# Patient Record
Sex: Male | Born: 1935 | Race: White | Hispanic: No | Marital: Married | Smoking: Never smoker
Health system: Southern US, Community
[De-identification: ages and names within clinical notes are randomized; demographics above are authoritative.]

## PROBLEM LIST (undated history)

## (undated) DIAGNOSIS — M1990 Unspecified osteoarthritis, unspecified site: Secondary | ICD-10-CM

## (undated) DIAGNOSIS — H3530 Unspecified macular degeneration: Secondary | ICD-10-CM

## (undated) DIAGNOSIS — K589 Irritable bowel syndrome without diarrhea: Secondary | ICD-10-CM

## (undated) DIAGNOSIS — I10 Essential (primary) hypertension: Secondary | ICD-10-CM

## (undated) DIAGNOSIS — E785 Hyperlipidemia, unspecified: Secondary | ICD-10-CM

## (undated) HISTORY — DX: Hyperlipidemia, unspecified: E78.5

## (undated) HISTORY — DX: Essential (primary) hypertension: I10

## (undated) HISTORY — PX: TOE SURGERY: SHX1073

## (undated) HISTORY — PX: TONSILLECTOMY: SUR1361

## (undated) HISTORY — PX: KNEE CARTILAGE SURGERY: SHX688

## (undated) HISTORY — PX: OTHER SURGICAL HISTORY: SHX169

## (undated) HISTORY — DX: Irritable bowel syndrome without diarrhea: K58.9

## (undated) HISTORY — PX: ELBOW SURGERY: SHX618

## (undated) HISTORY — PX: SHOULDER SURGERY: SHX246

---

## 2001-06-29 ENCOUNTER — Ambulatory Visit (HOSPITAL_COMMUNITY): Admission: RE | Admit: 2001-06-29 | Discharge: 2001-06-29 | Payer: Self-pay | Admitting: Internal Medicine

## 2003-01-30 ENCOUNTER — Ambulatory Visit (HOSPITAL_COMMUNITY): Admission: RE | Admit: 2003-01-30 | Discharge: 2003-01-30 | Payer: Self-pay | Admitting: Pediatrics

## 2004-02-03 ENCOUNTER — Ambulatory Visit: Payer: Self-pay | Admitting: Internal Medicine

## 2004-02-05 ENCOUNTER — Ambulatory Visit (HOSPITAL_COMMUNITY): Admission: RE | Admit: 2004-02-05 | Discharge: 2004-02-05 | Payer: Self-pay | Admitting: Internal Medicine

## 2004-02-19 ENCOUNTER — Ambulatory Visit (HOSPITAL_COMMUNITY): Admission: RE | Admit: 2004-02-19 | Discharge: 2004-02-19 | Payer: Self-pay | Admitting: Orthopaedic Surgery

## 2004-03-10 ENCOUNTER — Ambulatory Visit: Payer: Self-pay | Admitting: Internal Medicine

## 2004-06-12 ENCOUNTER — Ambulatory Visit (HOSPITAL_COMMUNITY): Admission: RE | Admit: 2004-06-12 | Discharge: 2004-06-12 | Payer: Self-pay | Admitting: Family Medicine

## 2005-01-27 ENCOUNTER — Ambulatory Visit (HOSPITAL_COMMUNITY): Admission: RE | Admit: 2005-01-27 | Discharge: 2005-01-27 | Payer: Self-pay | Admitting: Family Medicine

## 2005-01-27 ENCOUNTER — Encounter: Payer: Self-pay | Admitting: Orthopedic Surgery

## 2006-05-11 ENCOUNTER — Ambulatory Visit (HOSPITAL_COMMUNITY): Admission: RE | Admit: 2006-05-11 | Discharge: 2006-05-11 | Payer: Self-pay | Admitting: Orthopaedic Surgery

## 2006-05-19 ENCOUNTER — Ambulatory Visit (HOSPITAL_COMMUNITY): Admission: RE | Admit: 2006-05-19 | Discharge: 2006-05-19 | Payer: Self-pay | Admitting: Family Medicine

## 2006-06-13 ENCOUNTER — Encounter: Admission: RE | Admit: 2006-06-13 | Discharge: 2006-06-13 | Payer: Self-pay | Admitting: Orthopedic Surgery

## 2006-06-16 ENCOUNTER — Encounter: Admission: RE | Admit: 2006-06-16 | Discharge: 2006-06-16 | Payer: Self-pay | Admitting: Orthopedic Surgery

## 2006-06-28 ENCOUNTER — Encounter: Admission: RE | Admit: 2006-06-28 | Discharge: 2006-06-28 | Payer: Self-pay | Admitting: Orthopedic Surgery

## 2006-08-29 ENCOUNTER — Ambulatory Visit (HOSPITAL_COMMUNITY): Admission: RE | Admit: 2006-08-29 | Discharge: 2006-08-29 | Payer: Self-pay | Admitting: Orthopaedic Surgery

## 2007-01-26 ENCOUNTER — Ambulatory Visit (HOSPITAL_COMMUNITY): Admission: RE | Admit: 2007-01-26 | Discharge: 2007-01-26 | Payer: Self-pay | Admitting: Orthopaedic Surgery

## 2007-03-06 ENCOUNTER — Encounter: Admission: RE | Admit: 2007-03-06 | Discharge: 2007-03-06 | Payer: Self-pay | Admitting: Orthopaedic Surgery

## 2007-03-20 ENCOUNTER — Encounter: Admission: RE | Admit: 2007-03-20 | Discharge: 2007-03-20 | Payer: Self-pay | Admitting: Orthopaedic Surgery

## 2007-07-03 ENCOUNTER — Ambulatory Visit (HOSPITAL_COMMUNITY): Admission: RE | Admit: 2007-07-03 | Discharge: 2007-07-03 | Payer: Self-pay | Admitting: Ophthalmology

## 2009-02-14 ENCOUNTER — Ambulatory Visit (HOSPITAL_COMMUNITY): Admission: RE | Admit: 2009-02-14 | Discharge: 2009-02-14 | Payer: Self-pay | Admitting: Internal Medicine

## 2009-02-14 ENCOUNTER — Encounter: Payer: Self-pay | Admitting: Orthopedic Surgery

## 2009-03-05 ENCOUNTER — Encounter: Payer: Self-pay | Admitting: Orthopedic Surgery

## 2009-03-06 ENCOUNTER — Ambulatory Visit: Payer: Self-pay | Admitting: Orthopedic Surgery

## 2009-03-06 DIAGNOSIS — M25819 Other specified joint disorders, unspecified shoulder: Secondary | ICD-10-CM | POA: Insufficient documentation

## 2009-03-06 DIAGNOSIS — M7580 Other shoulder lesions, unspecified shoulder: Secondary | ICD-10-CM

## 2009-03-06 DIAGNOSIS — M25519 Pain in unspecified shoulder: Secondary | ICD-10-CM

## 2009-03-07 ENCOUNTER — Telehealth (INDEPENDENT_AMBULATORY_CARE_PROVIDER_SITE_OTHER): Payer: Self-pay | Admitting: *Deleted

## 2009-03-10 ENCOUNTER — Ambulatory Visit (HOSPITAL_COMMUNITY): Admission: RE | Admit: 2009-03-10 | Discharge: 2009-03-10 | Payer: Self-pay | Admitting: Orthopedic Surgery

## 2009-03-11 ENCOUNTER — Encounter: Payer: Self-pay | Admitting: Orthopedic Surgery

## 2009-03-25 ENCOUNTER — Ambulatory Visit: Payer: Self-pay | Admitting: Orthopedic Surgery

## 2009-03-25 DIAGNOSIS — M75120 Complete rotator cuff tear or rupture of unspecified shoulder, not specified as traumatic: Secondary | ICD-10-CM

## 2009-03-31 ENCOUNTER — Encounter (INDEPENDENT_AMBULATORY_CARE_PROVIDER_SITE_OTHER): Payer: Self-pay | Admitting: *Deleted

## 2009-04-04 ENCOUNTER — Ambulatory Visit (HOSPITAL_COMMUNITY): Admission: RE | Admit: 2009-04-04 | Discharge: 2009-04-04 | Payer: Self-pay | Admitting: Orthopedic Surgery

## 2009-04-04 ENCOUNTER — Ambulatory Visit: Payer: Self-pay | Admitting: Orthopedic Surgery

## 2009-04-08 ENCOUNTER — Telehealth: Payer: Self-pay | Admitting: Orthopedic Surgery

## 2009-04-09 ENCOUNTER — Ambulatory Visit: Payer: Self-pay | Admitting: Orthopedic Surgery

## 2009-04-10 ENCOUNTER — Encounter (HOSPITAL_COMMUNITY): Admission: RE | Admit: 2009-04-10 | Discharge: 2009-05-10 | Payer: Self-pay | Admitting: Orthopedic Surgery

## 2009-04-10 ENCOUNTER — Encounter: Payer: Self-pay | Admitting: Orthopedic Surgery

## 2009-04-15 ENCOUNTER — Ambulatory Visit: Payer: Self-pay | Admitting: Orthopedic Surgery

## 2009-05-08 ENCOUNTER — Encounter: Payer: Self-pay | Admitting: Orthopedic Surgery

## 2009-05-13 ENCOUNTER — Encounter (HOSPITAL_COMMUNITY): Admission: RE | Admit: 2009-05-13 | Discharge: 2009-06-12 | Payer: Self-pay | Admitting: Orthopedic Surgery

## 2009-05-19 ENCOUNTER — Ambulatory Visit: Payer: Self-pay | Admitting: Orthopedic Surgery

## 2009-06-06 ENCOUNTER — Encounter: Payer: Self-pay | Admitting: Orthopedic Surgery

## 2009-06-30 ENCOUNTER — Ambulatory Visit: Payer: Self-pay | Admitting: Orthopedic Surgery

## 2009-10-02 ENCOUNTER — Ambulatory Visit: Payer: Self-pay | Admitting: Orthopedic Surgery

## 2009-10-02 DIAGNOSIS — M7660 Achilles tendinitis, unspecified leg: Secondary | ICD-10-CM

## 2009-10-27 ENCOUNTER — Encounter (HOSPITAL_COMMUNITY): Admission: RE | Admit: 2009-10-27 | Discharge: 2009-11-26 | Payer: Self-pay | Admitting: Orthopedic Surgery

## 2009-11-13 ENCOUNTER — Encounter: Payer: Self-pay | Admitting: Orthopedic Surgery

## 2009-11-17 ENCOUNTER — Encounter: Payer: Self-pay | Admitting: Orthopedic Surgery

## 2009-11-21 ENCOUNTER — Encounter: Payer: Self-pay | Admitting: Orthopedic Surgery

## 2009-11-28 ENCOUNTER — Encounter (HOSPITAL_COMMUNITY)
Admission: RE | Admit: 2009-11-28 | Discharge: 2009-12-28 | Payer: Self-pay | Source: Home / Self Care | Admitting: Orthopedic Surgery

## 2009-12-02 ENCOUNTER — Encounter: Payer: Self-pay | Admitting: Orthopedic Surgery

## 2010-02-03 ENCOUNTER — Ambulatory Visit: Payer: Self-pay | Admitting: Orthopedic Surgery

## 2010-02-03 DIAGNOSIS — R609 Edema, unspecified: Secondary | ICD-10-CM

## 2010-04-19 ENCOUNTER — Encounter: Payer: Self-pay | Admitting: Family Medicine

## 2010-04-19 ENCOUNTER — Encounter: Payer: Self-pay | Admitting: Orthopedic Surgery

## 2010-04-28 NOTE — Miscellaneous (Signed)
Summary: PT order  PT order   Imported By: Cammie Sickle 05/07/2009 20:03:30  _____________________________________________________________________  External Attachment:    Type:   Image     Comment:   External Document

## 2010-04-28 NOTE — Miscellaneous (Signed)
Summary: Pre-auth notification for out-patient procedure  Clinical Lists Changes   RE: Out-patient surgery scheduled 04/04/09 at [facility] / CPT [phone], per [employer], notification # [id number], per online pre-authorization

## 2010-04-28 NOTE — Assessment & Plan Note (Signed)
Summary: 6 WK RE-CK/POST OP RT SHOULDER SURG 04/04/09/SEC HORIZ/CAF   Visit Type:  post op check Referring Provider:  Dr. Dwana Melena  Primary Provider:  Dr. Dwana Melena   CC:  right shoulder .  History of Present Illness: I saw Christopher Warner in the office today for a 6 week  followup visit.  He is a 77 years old man with the complaint of:  right shoulder.  Procedure 04/04/09 open rotator cuff repair, right shoulder, with distal clavicle excision  Medication None  examination reveals a full range of motion the RIGHT shoulder with excellent strength.  He has 180 of flexion.  Doing very well  Therapy can stop  Soak it from the lift light weights and do light activities we'll see him in 3 months    Allergies: No Known Drug Allergies   Other Orders: Post-Op Check (16606)  Patient Instructions: 1)  3 months f/u  2)  light activities are ok  3)  light weights are ok

## 2010-04-28 NOTE — Miscellaneous (Signed)
Summary: PT order/referral form  PT order/referral form   Imported By: Jacklynn Ganong 11/18/2009 12:21:35  _____________________________________________________________________  External Attachment:    Type:   Image     Comment:   External Document

## 2010-04-28 NOTE — Progress Notes (Signed)
Summary: call about post op appointment and PT  Phone Note Call from Patient   Caller: Patient Summary of Call: Patient called back, fol'g post op appointment change by our office d/t weather from Tues to Wed 04/09/09 -- asked if he should keep the 8am appt Wed 1/12, for physical therapy.  I advised patient to wait to see Dr Romeo Apple as scheduled before starting PT.  Please advise if patient should proceed with therapy prior to appt in office. Initial call taken by: Cammie Sickle,  April 08, 2009 2:20 PM  Follow-up for Phone Call        agree Follow-up by: Fuller Canada MD,  April 09, 2009 9:47 AM

## 2010-04-28 NOTE — Assessment & Plan Note (Signed)
Summary: 1 WK RE-CK/POST OP 2/RT SHOULDER/SEC HORIZ/CAF   Referring Provider:  Dr. Dwana Melena  Primary Provider:  Dr. Dwana Melena    History of Present Illness: I saw Christopher Warner in the office today for a followup visit.  He is a 77 years old man with the complaint of: postop visit #2, postop day #12  Procedure 04/04/09 opened rotator cuff repair, right shoulder, with distal clavicle excision  Medication None  Subjectives post op 2 remove staples.  wants to know if he can leave sling off some.  Has not driven yet.  therapy is going well  His arm looks great his wound looks good his elbow wrist and hand motion is normal.  His shoulder motion matches his physical therapy notes see physical therapy notes  Doing well followup in 6 weeks    Allergies: No Known Drug Allergies   Impression & Recommendations:  Problem # 1:  AFTERCARE FOLLOW SURGERY MUSCULOSKEL SYSTEM NEC (ICD-V58.78) Assessment Improved  Orders: Post-Op Check (40102)  Problem # 2:  RUPTURE ROTATOR CUFF (ICD-727.61) Assessment: Improved  Orders: Post-Op Check (72536)  Patient Instructions: 1)  continure therapy  2)   come back in 6 weeks 3)  take med as needed

## 2010-04-28 NOTE — Miscellaneous (Signed)
Summary: PT progress note  PT progress note   Imported By: Jacklynn Ganong 12/03/2009 09:24:24  _____________________________________________________________________  External Attachment:    Type:   Image     Comment:   External Document

## 2010-04-28 NOTE — Assessment & Plan Note (Signed)
Summary: 1 M RE-CK/POST OP/RT SHOULDER SURG 04/04/09/SEC HORIZ/CAF   Visit Type:  Follow-up Referring Provider:  Dr. Dwana Melena  Primary Provider:  Dr. Dwana Melena   CC:  post op shoulder.  History of Present Illness: I saw Christopher Warner in the office today for a followup visit.  He is a 77 years old man with the complaint of: postop visit #3, 6 weeks post op   Procedure 04/04/09 opened rotator cuff repair, right shoulder, with distal clavicle excision  Medication None  Subjectives to recheck the shoulder after doing therapy.  Doing well, no pain.     Allergies: No Known Drug Allergies  Physical Exam  Additional Exam:  PROM : 150   AROM 130   incision looks great    Other Orders: Post-Op Check (16109)  Patient Instructions: 1)  Return in 6 weeks  2)  continue the therapy

## 2010-04-28 NOTE — Assessment & Plan Note (Signed)
Summary: POST OP 1/RT SHOULDER/SEC HORIZ,EFF JAN2011/CAF   Referring Provider:  Dr. Dwana Melena  Primary Provider:  Dr. Dwana Melena    History of Present Illness: DOS Procedure Medication Subjectives Date of surgery was January 7  Procedure was opened rotator cuff repair, LEFT shoulder, with distal clavicle excision  Medications none  Subjective no pain  Incision no swelling, drainage or tenderness. No swelling in the shoulder. Neurovascular exam in the hand is normal.  Doing well  Allergies: No Known Drug Allergies   Impression & Recommendations:  Problem # 1:  AFTERCARE FOLLOW SURGERY MUSCULOSKEL SYSTEM NEC (ICD-V58.78)  Problem # 2:  RUPTURE ROTATOR CUFF (ICD-727.61)  Orders: Physical Therapy Referral (PT) Post-Op Check (76283)  Patient Instructions: 1)  OK TO START THERAPY NOW  2)  OK TO SLEEP IN YOUR BED  3)  WEAR SLING X 6 WEEKS  4)  USE THE ICE PAD AS NEEDED  5)  RETURN IN A W EEK FOR STAPLES TO COME OUT

## 2010-04-28 NOTE — Miscellaneous (Signed)
Summary: PT clinical evaluation  PT clinical evaluation   Imported By: Jacklynn Ganong 11/18/2009 12:22:14  _____________________________________________________________________  External Attachment:    Type:   Image     Comment:   External Document

## 2010-04-28 NOTE — Miscellaneous (Signed)
Summary: OT clinical evaluation  OT clinical evaluation   Imported By: Jacklynn Ganong 04/22/2009 11:22:46  _____________________________________________________________________  External Attachment:    Type:   Image     Comment:   External Document

## 2010-04-28 NOTE — Assessment & Plan Note (Signed)
Summary: LT HEEL/ACHILLES PAIN RECURRING/?XRAY/[employer] MEDICARE COMPL/CAF   Visit Type:  Follow-up Referring Provider:  Dr. Dwana Melena  Primary Provider:  Dr. Dwana Melena   CC:  left heel pain.  History of Present Illness: DX: Achilles tendonitis  Treatment: therapy  MEDS: none  Complaints: He says that he has swelling in his ankle and leg, no pain. He says that it feels funny when he moves it sometimes.  Today, scheduled for: recheck  The ankle/achilles tendon is assymptomatic   He does have edema in the left leg no tenderness   N ROM in the ankle      Allergies: No Known Drug Allergies  Past History:  Past Medical History: Last updated: 03/25/2009   cholesterol htn  Past Surgical History: Last updated: 03/06/2009 tonsils back x 3 rt knee 2007  Family history reviewed for relevance to current acute and chronic problems. Social history (including risk factors) reviewed for relevance to current acute and chronic problems.  Family History: Reviewed history from 03/06/2009 and no changes required. na  Social History: Reviewed history from 03/06/2009 and no changes required. Patient is married.  retired no smoking no alcohol no caffeine  Review of Systems      See HPI   Impression & Recommendations:  Problem # 1:  ACHILLES TENDINITIS (ICD-726.71) Assessment Improved  Orders: Est. Patient Level III (04540)  Problem # 2:  EDEMA (ICD-782.3) Assessment: New  compression hose as needed   Orders: Est. Patient Level III (98119)  Patient Instructions: 1)  Give it a few more weeks for the swelling to go down.  2)  Get a compression hose to wear  3)  Please schedule a follow-up appointment as needed.   Orders Added: 1)  Est. Patient Level III [14782]

## 2010-04-28 NOTE — Miscellaneous (Signed)
Summary: PT progress note  PT progress note   Imported By: Jacklynn Ganong 12/09/2009 13:23:27  _____________________________________________________________________  External Attachment:    Type:   Image     Comment:   External Document

## 2010-04-28 NOTE — Assessment & Plan Note (Signed)
Summary: 3 M RE-CK RT SHOULDER+CK LT HEEL/SHOULD SUR 04/04/09/SEC HORIZ/CAF   Visit Type:  Follow-up Referring Provider:  Dr. Dwana Melena  Primary Provider:  Dr. Dwana Melena   CC:  right shoulder.  History of Present Illness: I saw Christopher Warner in the office today for a followup visit.  He is a 77 years old man s/p   Procedure 04/04/09 open rotator cuff repair, right shoulder, with distal clavicle excision  Medication None  no problems with her shoulder at this time does complain of LEFT Achilles tendon pain  Pain for about a month or so no trauma no previous injuries or problems with his Achilles tendon.  Exam shows that he has tenderness and swelling in nodularity in the Achilles tendon with normal passive range of motion normal strength ankle is stable his gait seems to be normal skin is intact.  Impression Achilles tendinitis recommend physical therapy  RIGHT shoulder doing well discharge in terms of RIGHT shoulder  Follow up as needed  Allergies: No Known Drug Allergies   Impression & Recommendations:  Problem # 1:  ACHILLES TENDINITIS (ICD-726.71) Assessment New  Orders: Physical Therapy Referral (PT) Est. Patient Level II (78295)  Problem # 2:  AFTERCARE FOLLOW SURGERY MUSCULOSKEL SYSTEM NEC (ICD-V58.78) Assessment: Improved  Orders: Post-Op Check (62130)  Patient Instructions: 1)  Go for therapy for left achilles tendinitis for 4 weeks 2)  come back as needed for shoulder

## 2010-04-28 NOTE — Miscellaneous (Signed)
Summary: OT progress note  OT progress note   Imported By: Jacklynn Ganong 06/17/2009 11:42:45  _____________________________________________________________________  External Attachment:    Type:   Image     Comment:   External Document

## 2010-04-28 NOTE — Miscellaneous (Signed)
Summary: Rehab Report  Rehab Report   Imported By: Elvera Maria 05/20/2009 11:23:37  _____________________________________________________________________  External Attachment:    Type:   Image     Comment:   progress note pt

## 2010-04-28 NOTE — Letter (Signed)
Summary: Surgery order RT shoulder  Surgery order RT shoulder   Imported By: Cammie Sickle 04/09/2009 16:45:49  _____________________________________________________________________  External Attachment:    Type:   Image     Comment:   External Document

## 2010-06-14 LAB — DIFFERENTIAL
Basophils Absolute: 0 10*3/uL (ref 0.0–0.1)
Basophils Relative: 0 % (ref 0–1)
Eosinophils Absolute: 0.2 10*3/uL (ref 0.0–0.7)
Eosinophils Relative: 3 % (ref 0–5)
Lymphocytes Relative: 29 % (ref 12–46)
Lymphs Abs: 1.9 10*3/uL (ref 0.7–4.0)
Monocytes Absolute: 0.6 10*3/uL (ref 0.1–1.0)
Monocytes Relative: 10 % (ref 3–12)
Neutro Abs: 3.8 10*3/uL (ref 1.7–7.7)
Neutrophils Relative %: 58 % (ref 43–77)

## 2010-06-14 LAB — CBC
HCT: 45.2 % (ref 39.0–52.0)
Hemoglobin: 15.2 g/dL (ref 13.0–17.0)
MCHC: 33.6 g/dL (ref 30.0–36.0)
MCV: 95.3 fL (ref 78.0–100.0)
Platelets: 209 10*3/uL (ref 150–400)
RBC: 4.74 MIL/uL (ref 4.22–5.81)
RDW: 13.3 % (ref 11.5–15.5)
WBC: 6.6 10*3/uL (ref 4.0–10.5)

## 2010-06-14 LAB — BASIC METABOLIC PANEL
BUN: 16 mg/dL (ref 6–23)
CO2: 32 mEq/L (ref 19–32)
Calcium: 9.1 mg/dL (ref 8.4–10.5)
Chloride: 101 mEq/L (ref 96–112)
Creatinine, Ser: 1.13 mg/dL (ref 0.4–1.5)
GFR calc Af Amer: >60 mL/min (ref >60)
GFR calc non Af Amer: >60 mL/min (ref >60)
Glucose, Bld: 99 mg/dL (ref 70–99)
Potassium: 3.9 mEq/L (ref 3.5–5.1)
Sodium: 139 mEq/L (ref 135–145)

## 2010-06-14 LAB — PROTIME-INR
INR: 1.08 (ref 0.00–1.49)
Prothrombin Time: 13.9 seconds (ref 11.6–15.2)

## 2010-06-14 LAB — APTT: aPTT: 23 seconds — ABNORMAL LOW (ref 24–37)

## 2010-08-11 NOTE — Op Note (Signed)
Christopher Warner, Christopher Warner               ACCOUNT NO.:  [id number]   MEDICAL RECORD NO.:  [id number]          PATIENT TYPE:  AMB   LOCATION:  DAY                           FACILITY:  [facility]   PHYSICIAN:  J. Darreld Mclean, M.D. DATE OF BIRTH:  07-Dec-1927   DATE OF PROCEDURE:  01/26/2007  DATE OF DISCHARGE:                               OPERATIVE REPORT   PREOPERATIVE DIAGNOSES:  1. Tear of medial meniscus of the right knee.  2. Degenerative joint disease.   POSTOPERATIVE DIAGNOSES:  1. Tear of medial meniscus of the right knee.  2. Degenerative joint disease.  3. Loose osteochondral fragments medially and laterally.   PROCEDURE:  Arthroscopy, __________  knee.  Partial medial meniscectomy  and debridement osteochondral fragments medial and lateral of the distal  femur.   ANESTHESIA:  Was spinal.   TOURNIQUET TIME:  Was 28 minutes.   SURGEON:  J. Darreld Mclean, M.D.   Holmium laser was used during the procedure.  No drains.   INDICATIONS:  The patient has had pain and tenderness in his right knee  for several months.  MRI done in June showed a tear of medial meniscus.  He initially improved with conservative treatment, but it has gotten  worse with more pain, giving way and swelling.  Surgery is recommended.  Risks and imponderables of the procedure discussed preoperatively.  The  patient appeared to understand and agreed to the procedure.   DESCRIPTION OF THE PROCEDURE:  The patient was seen in the holding area.  The right knee was identified as the correct surgical site.  He placed a  mark on the right knee.  I placed a mark on the right knee.  I talked to  his wife preoperatively.  The patient was brought to the operating room  and given spinal anesthesia and then placed supine on the operating room  table.  Tourniquet and leg holder were placed deflated right upper  thigh.  He was prepped and draped in the usual manner.  We had a time-  out identifying Christopher Warner as the patient  and the right knee as the  correct surgical site.  His leg was elevated, wrapped circumferentially  with an Esmarch bandage.  Tourniquet inflated to 300 mmHg.  Esmarch  bandage removed.  Inflow cannula inserted medially, and lactated  Ringer's was instilled in the knee by an infusion pump.  Laterally, the  arthroscope was inserted.  Knee was systematically examined.   Findings included some synovitis in the patella pouch area with  degenerative joint disease of the patella, grade 2.  Medially, he had  loose particles of meniscus and articular portions floating around  medially.  There was a tear in the posterior horn of the medial meniscus  and also anteriorly.  He had grade 3 changes of the distal femoral  condyle with some loose osteoarticular chondral surfaces.  The anterior  cruciate was partially torn within the substance but appears to be  competent laterally.  He had a loose osteochondral articular surface  fragments hanging down, but the meniscus appeared to be slightly frayed  but  intact.  Gutters had some small meniscal fragments.   Attention was directed to the medial side, and using a meniscal shaver  and a punch, the area was debrided of loose fragments, and then the  medial meniscectomy was carried out.  A good, smooth contour was  obtained.  The osteochondral fragments debrided.  Laterally, he had a  large osteochondral fragment that was loose, and this was debrided and  smoothed out.  This was on the distal femur.  Again, the meniscus,  although had fraying, was not torn.  I used a holmium laser to complete  the meniscectomy medially, smooth out the area and then also around the  area of the defect on the distal femur medially.  Permanent pictures  were taken throughout the procedure.  Knee was systematically examined  and no new pathology found.  The wound was reapproximated using 3-0  nylon interrupted vertical mattress manner.  Marcaine 0.25% was  instilled in the  knee around the anterior puncture wound.  Tourniquet  deflated after 28 minutes.  Sterile dressing applied.  Bulky dressing  applied.  Knee immobilizer applied.  The patient was given a  prescription for Darvocet-N 100.  He went to recovery in good position.  I will see him in the office in approximately 10 days to 2 weeks.  Physical therapy have been arranged.  If he has any difficulty, contact  me through the office hospital beeper system.           ______________________________  Shela Commons. Darreld Mclean, M.D.     JWK/MEDQ  D:  01/26/2007  T:  01/26/2007  Job:  [id number]

## 2010-08-11 NOTE — H&P (Signed)
NAMEMOHAMMAD, GRANADE               ACCOUNT NO.:  [id number]   MEDICAL RECORD NO.:  [id number]          PATIENT TYPE:  AMB   LOCATION:  DAY                           FACILITY:  [facility]   PHYSICIAN:  J. Darreld Mclean, M.D. DATE OF BIRTH:  [birth year]   DATE OF ADMISSION:  01/26/2007  DATE OF DISCHARGE:  LH                              HISTORY & PHYSICAL   CHIEF COMPLAINT:  My knee hurts.   The patient is a 77 year old male with pain and tenderness in his right  knee.  He has had giving way and pain in the knee for some time.  I saw  him in May of this year for complaints of significant pain and giving  way of the knee.  He obtained an MRI on June 2 of this year showing a  complex tear of the anterior horn medial meniscus with a perimeniscal  cyst.  He had a longitudinal tear in the PCL and he had a tear in the  medial portion of the meniscus.  He got better after I gave him an  treatment injection.  He wanted to hold off any surgery.  Pain has  reappeared and he is having more pain, more swelling, popping.  I have  recommended an arthroscopy of the knee.  He has elected at this point in  time to have the surgery.  The risks and imponderables have been  discussed with him.   CURRENT MEDICATIONS:  1. Metoprolol 50 mg daily.  2. Hydrochlorothiazide 25 mg daily.  3. Naprosyn 500 mg b.i.d.  4. Allopurinol 300 mg daily.  5. __________ 20 mg daily.  6. Lutein 6 mg daily.  7. Aspirin 81 mg daily.   He denies any allergies.   He is post back surgery x3 in 1998, 1991 and 1994.   He does not smoke or drink.   FAMILY HISTORY:  Unremarkable.   He does have hypertension, high blood pressure, back pain, leg pain,  cataracts.  He has been seen previously by [facility]'s Dr.  Jeral Fruit and also by his family doctor, Dr. Milinda Cave.   The patient is married, lives here in [place].   He is alert, cooperative, oriented.  HEENT: Negative.  NECK:  Supple.  LUNGS:  Clear to P&A.  HEART:  Regular without murmur heard.  ABDOMEN:  Soft and benign without masses.  Right knee has an effusion, pain and tenderness in the medial joint  line, positive McMurray medially.  He has crepitus of his knee on the  right.  Gait is good with a very just slight limp to the right.  Other  extremities negative.  The prior back surgery scars.  Range of motion of his back is good.  NEUROLOGIC:  Intact.  SKIN:  Intact.   IMPRESSION:  1. Tear, medial meniscus, right knee.  2. History of low back pain with chronic pain.  3. Hypertension.  4. Hypercholesterolemia.   PLAN:  Medial meniscectomy via arthroscopy of the knee on the right.  Labs are pending.  ______________________________  Shela Commons. Darreld Mclean, M.D.     JWK/MEDQ  D:  01/25/2007  T:  01/26/2007  Job:  [id number]

## 2010-08-14 NOTE — Procedures (Signed)
   [name], Christopher Warner                         ACCOUNT NO.:  [id number]   MEDICAL RECORD NO.:  [id number]                   PATIENT TYPE:  OUT   LOCATION:  DFTL                                 FACILITY:  [facility]   PHYSICIAN:  Francoise Schaumann. Halm, D.O.                DATE OF BIRTH:  01/27/1928   DATE OF PROCEDURE:  01/30/2003  DATE OF DISCHARGE:                                    STRESS TEST   REASON FOR STUDY:  Chest pain.   INDICATIONS FOR STUDY:  The patient is a 77 year old gentleman with recent  history of atypical chest pain.  The study is done to rule out coronary  artery disease.  He normally exercises regularly and has had no exertional  chest pain.   The patient underwent Bruce protocol exercise treadmill testing.  His  initial blood pressure, heart rate and evaluation were unremarkable.  His  resting ECG showed no LVH, dysrhythmia or ST segment abnormality.  The  patient underwent stress testing without complications.  The study was  excellent study with good tolerance to exercise.  He had a normal  hemodynamic response to exercise.  There were no dysrhythmias.  He easily  reached and exceeded his target heart rate and exercised approximately eight  minutes.  His ECG showed minimal 1 mm ST segment depression at maximum  exercise which resolved quickly into recovery.  He had no associated  symptoms and the study was stopped due to him reaching is target heart rate.   CONCLUSION:  Negative stress test for coronary artery disease.      ___________________________________________                                            Francoise Schaumann. Milford Cage, D.O.   SJH/MEDQ  D:  01/30/2003  T:  01/30/2003  Job:  [id number]

## 2010-08-14 NOTE — Op Note (Signed)
NAMEJUANDEDIOS, Christopher Warner               ACCOUNT NO.:  [id number]   MEDICAL RECORD NO.:  [id number]          PATIENT TYPE:  AMB   LOCATION:  DAY                           FACILITY:  [facility]   PHYSICIAN:  Lionel December, M.D.    DATE OF BIRTH:  01/27/1928   DATE OF PROCEDURE:  02/05/2004  DATE OF DISCHARGE:                                 OPERATIVE REPORT   PROCEDURE:  Flexible sigmoidoscopy.   INDICATIONS:  The patient is a 77 year old Caucasian male with chronic  nonbloody diarrhea. Stool study has been negative. Since he had a  colonoscopy about 2-1/2 years ago, we decided to proceed with flexible  sigmoidoscopy. Procedure risks were reviewed with the patient and informed  consent was obtained.   PREOPERATIVE MEDICATIONS:  None.   FINDINGS:  Procedure performed in endoscopy suite. The patient was placed in  the left lateral position and rectal examination performed. No abnormality  noted on external or digital exam. Olympus video scope was placed in the  rectum. Rectal mucosa was normal. Scope was passed to the sigmoid colon.  Semiformed and formed scope. Few diverticular were noted. Scope was passed  to 35 cm where he had more formed stool and therefore could not pass the  scope any further. Pictures were taken for the record. Mucosa of the sigmoid  colon was also normal. Scope was retroflexed in the rectum to examine  anorectal junction, and small hemorrhoids were noted the dentate line.  Endoscope was straightened and withdrawn. The patient tolerated the  procedure well.   FINAL DIAGNOSES:  1.  Scattered diverticula at sigmoid colon. No evidence of colitis involving      mucosa of the sigmoid colon or rectum.  2.  Please semiformed and formed stool was noted in the sigmoid colon,      limiting the exam.  3.  Small external hemorrhoids.   RECOMMENDATIONS:  1.  High fiber diet.  2.  Citrucel 1 tablespoon daily.  3.  Levbid half a tablet every morning.  4.  He will keep stool diary  as to frequency and consistency of stools. He      will return for OV in a month. If he reports no improvement, he will      need further workup.     [name]   NR/MEDQ  D:  02/05/2004  T:  02/06/2004  Job:  [id number]   cc:   Jeoffrey Massed, MD  [address]  [place]  [place] [address]  Fax: [phone]

## 2010-12-14 ENCOUNTER — Other Ambulatory Visit (HOSPITAL_COMMUNITY): Payer: Self-pay | Admitting: Physical Medicine and Rehabilitation

## 2010-12-14 ENCOUNTER — Ambulatory Visit (HOSPITAL_COMMUNITY)
Admission: RE | Admit: 2010-12-14 | Discharge: 2010-12-14 | Disposition: A | Discharge status: Home / Self Care | Payer: Medicare Other | Source: Ambulatory Visit | Attending: Physical Medicine and Rehabilitation | Admitting: Physical Medicine and Rehabilitation

## 2010-12-14 DIAGNOSIS — M47817 Spondylosis without myelopathy or radiculopathy, lumbosacral region: Secondary | ICD-10-CM

## 2010-12-14 DIAGNOSIS — M899 Disorder of bone, unspecified: Secondary | ICD-10-CM | POA: Insufficient documentation

## 2010-12-14 DIAGNOSIS — M545 Low back pain, unspecified: Secondary | ICD-10-CM | POA: Insufficient documentation

## 2010-12-14 DIAGNOSIS — M949 Disorder of cartilage, unspecified: Secondary | ICD-10-CM | POA: Insufficient documentation

## 2010-12-14 DIAGNOSIS — M5137 Other intervertebral disc degeneration, lumbosacral region: Secondary | ICD-10-CM | POA: Insufficient documentation

## 2010-12-14 DIAGNOSIS — M51379 Other intervertebral disc degeneration, lumbosacral region without mention of lumbar back pain or lower extremity pain: Secondary | ICD-10-CM | POA: Insufficient documentation

## 2010-12-18 ENCOUNTER — Other Ambulatory Visit (HOSPITAL_COMMUNITY): Payer: Self-pay | Admitting: Internal Medicine

## 2010-12-18 DIAGNOSIS — R52 Pain, unspecified: Secondary | ICD-10-CM

## 2010-12-21 ENCOUNTER — Ambulatory Visit (HOSPITAL_COMMUNITY): Payer: Medicare Other

## 2011-01-06 LAB — CBC
Platelets: 245
RBC: 4.45
WBC: 6.7

## 2011-01-06 LAB — COMPREHENSIVE METABOLIC PANEL
ALT: 22
AST: 29
Albumin: 3.7
CO2: 31
Chloride: 103
Creatinine, Ser: 0.96
GFR calc Af Amer: >60
GFR calc non Af Amer: >60
Potassium: 4.5
Sodium: 141
Total Bilirubin: 0.6

## 2011-01-06 LAB — URINALYSIS, ROUTINE W REFLEX MICROSCOPIC
Bilirubin Urine: NEGATIVE
Glucose, UA: NEGATIVE
Ketones, ur: NEGATIVE
Nitrite: NEGATIVE
Protein, ur: NEGATIVE
pH: 7

## 2011-01-06 LAB — DIFFERENTIAL
Basophils Absolute: 0.1
Eosinophils Absolute: 0.3
Eosinophils Relative: 4
Lymphocytes Relative: 27
Monocytes Absolute: 0.6

## 2011-01-21 ENCOUNTER — Ambulatory Visit (INDEPENDENT_AMBULATORY_CARE_PROVIDER_SITE_OTHER): Payer: Medicare Other | Admitting: Internal Medicine

## 2011-01-21 ENCOUNTER — Encounter (INDEPENDENT_AMBULATORY_CARE_PROVIDER_SITE_OTHER): Payer: Self-pay | Admitting: Internal Medicine

## 2011-01-21 VITALS — BP 108/74 | HR 60 | Temp 98.1°F | Ht 70.0 in | Wt 176.8 lb

## 2011-01-21 DIAGNOSIS — R197 Diarrhea, unspecified: Secondary | ICD-10-CM

## 2011-01-21 DIAGNOSIS — K589 Irritable bowel syndrome without diarrhea: Secondary | ICD-10-CM

## 2011-01-21 NOTE — Progress Notes (Signed)
Subjective:     Patient ID: Christopher Warner, male   DOB: [birth year], 77 y.o.   MRN: [id number]  HPI  Presents today with c/o that for 2 months he has had diarrhea.He is going anywhere from 6-7 times a day. He has tried Digestive Fantage and Imodium which did not help. Last colonoscopy was 5 or 6 yrs ago. He usually has a colonoscopy about every 5 yrs. Appetite is good.  He says he has lost about 8 pound over the past 2 months.  No abdominal pain. No rectal bleeding or melena. He underwent a flexible sigmoidoscopy in 2005 for chronic diarrhea which revealed  Review of Systemsprocedure well.  FINAL DIAGNOSES:  1. Scattered diverticula at sigmoid colon. No evidence of colitis involving  mucosa of the sigmoid colon or rectum.  2. Please semiformed and formed stool was noted in the sigmoid colon,  limiting the exam.  3. Small external hemorrhoids.  2010 Colonoscopy 2010: Biopsy of polyp: acute colitis. Surveillance for polyps     Objective:   Physical Exam Alert and oriented. Skin warm and dry. Oral mucosa is moist. Natural teeth in good condition. Sclera anicteric, conjunctivae is pink. Thyroid not enlarged. No cervical lymphadenopathy. Lungs clear. Heart regular rate and rhythm.  Abdomen is soft. Bowel sounds are positive. No hepatomegaly. No abdominal masses felt. No tenderness.  No edema to lower extremities. Patient is alert and oriented.     Assessment:    Irritiable Bowel Syndrome, diarrhea predominant.  I discussed this case with Dr Karilyn Cota.    Plan:      Will get a CBC, TSH, and a cmet. Fiber 4 gm, Imodium 2mg  BID.  F/u in 2 months

## 2011-01-21 NOTE — Patient Instructions (Signed)
Immodium 2mg  BID, Fiber tabs 4 gms. TSH, CBC, and a Cmet. F/u 2 months.

## 2011-01-22 LAB — CBC WITH DIFFERENTIAL/PLATELET
Basophils Absolute: 0 10*3/uL (ref 0.0–0.1)
Basophils Relative: 1 % (ref 0–1)
Eosinophils Absolute: 0.3 10*3/uL (ref 0.0–0.7)
HCT: 40.9 % (ref 39.0–52.0)
Hemoglobin: 13.7 g/dL (ref 13.0–17.0)
MCH: 31.9 pg (ref 26.0–34.0)
MCHC: 33.5 g/dL (ref 30.0–36.0)
Monocytes Absolute: 0.7 10*3/uL (ref 0.1–1.0)
Monocytes Relative: 12 % (ref 3–12)
RDW: 13.4 % (ref 11.5–15.5)

## 2011-01-22 LAB — COMPREHENSIVE METABOLIC PANEL
Alkaline Phosphatase: 53 U/L (ref 39–117)
BUN: 23 mg/dL (ref 6–23)
Creat: 1.14 mg/dL (ref 0.50–1.35)
Glucose, Bld: 84 mg/dL (ref 70–99)
Total Bilirubin: 0.5 mg/dL (ref 0.3–1.2)

## 2011-03-15 ENCOUNTER — Ambulatory Visit (INDEPENDENT_AMBULATORY_CARE_PROVIDER_SITE_OTHER): Payer: Medicare Other | Admitting: Internal Medicine

## 2011-06-22 ENCOUNTER — Other Ambulatory Visit: Payer: Self-pay | Admitting: Internal Medicine

## 2011-06-22 DIAGNOSIS — M549 Dorsalgia, unspecified: Secondary | ICD-10-CM

## 2011-06-28 ENCOUNTER — Ambulatory Visit
Admission: RE | Admit: 2011-06-28 | Discharge: 2011-06-28 | Disposition: A | Discharge status: Home / Self Care | Payer: Medicare Other | Source: Ambulatory Visit | Attending: Internal Medicine | Admitting: Internal Medicine

## 2011-06-28 VITALS — BP 151/68 | HR 46

## 2011-06-28 DIAGNOSIS — M549 Dorsalgia, unspecified: Secondary | ICD-10-CM

## 2011-06-28 MED ORDER — IOHEXOL 180 MG/ML  SOLN
1.0000 mL | Freq: Once | INTRAMUSCULAR | Status: AC | PRN
  Administered 2011-06-28: 1 mL via EPIDURAL

## 2011-06-28 MED ORDER — METHYLPREDNISOLONE ACETATE 40 MG/ML INJ SUSP (RADIOLOG
120.0000 mg | Freq: Once | INTRAMUSCULAR | Status: AC
  Administered 2011-06-28: 120 mg via EPIDURAL

## 2011-06-28 NOTE — Discharge Instructions (Signed)

## 2011-08-05 ENCOUNTER — Encounter (INDEPENDENT_AMBULATORY_CARE_PROVIDER_SITE_OTHER): Payer: Self-pay

## 2011-08-20 ENCOUNTER — Encounter (INDEPENDENT_AMBULATORY_CARE_PROVIDER_SITE_OTHER): Payer: Self-pay

## 2011-11-18 ENCOUNTER — Other Ambulatory Visit: Payer: Self-pay | Admitting: Internal Medicine

## 2011-11-18 DIAGNOSIS — M549 Dorsalgia, unspecified: Secondary | ICD-10-CM

## 2011-11-18 DIAGNOSIS — M5430 Sciatica, unspecified side: Secondary | ICD-10-CM

## 2011-11-22 ENCOUNTER — Ambulatory Visit
Admission: RE | Admit: 2011-11-22 | Discharge: 2011-11-22 | Disposition: A | Discharge status: Home / Self Care | Payer: Medicare Other | Source: Ambulatory Visit | Attending: Internal Medicine | Admitting: Internal Medicine

## 2011-11-22 DIAGNOSIS — M549 Dorsalgia, unspecified: Secondary | ICD-10-CM

## 2011-11-22 DIAGNOSIS — M5430 Sciatica, unspecified side: Secondary | ICD-10-CM

## 2011-11-22 MED ORDER — METHYLPREDNISOLONE ACETATE 40 MG/ML INJ SUSP (RADIOLOG
120.0000 mg | Freq: Once | INTRAMUSCULAR | Status: AC
  Administered 2011-11-22: 120 mg via EPIDURAL

## 2011-11-22 MED ORDER — IOHEXOL 180 MG/ML  SOLN
1.0000 mL | Freq: Once | INTRAMUSCULAR | Status: AC | PRN
  Administered 2011-11-22: 1 mL via EPIDURAL

## 2011-12-03 ENCOUNTER — Other Ambulatory Visit: Payer: Self-pay | Admitting: Internal Medicine

## 2011-12-03 DIAGNOSIS — M5430 Sciatica, unspecified side: Secondary | ICD-10-CM

## 2011-12-03 DIAGNOSIS — M549 Dorsalgia, unspecified: Secondary | ICD-10-CM

## 2011-12-10 ENCOUNTER — Other Ambulatory Visit: Payer: Self-pay | Admitting: Internal Medicine

## 2011-12-10 ENCOUNTER — Ambulatory Visit
Admission: RE | Admit: 2011-12-10 | Discharge: 2011-12-10 | Disposition: A | Discharge status: Home / Self Care | Payer: Medicare Other | Source: Ambulatory Visit | Attending: Internal Medicine | Admitting: Internal Medicine

## 2011-12-10 DIAGNOSIS — M5430 Sciatica, unspecified side: Secondary | ICD-10-CM

## 2011-12-10 DIAGNOSIS — M549 Dorsalgia, unspecified: Secondary | ICD-10-CM

## 2011-12-10 MED ORDER — METHYLPREDNISOLONE ACETATE 40 MG/ML INJ SUSP (RADIOLOG
120.0000 mg | Freq: Once | INTRAMUSCULAR | Status: AC
  Administered 2011-12-10: 120 mg via EPIDURAL

## 2011-12-10 MED ORDER — IOHEXOL 180 MG/ML  SOLN
2.0000 mL | Freq: Once | INTRAMUSCULAR | Status: AC | PRN
  Administered 2011-12-10: 2 mL via EPIDURAL

## 2012-05-23 ENCOUNTER — Encounter (HOSPITAL_COMMUNITY): Payer: Self-pay | Admitting: Pharmacy Technician

## 2012-05-29 ENCOUNTER — Encounter (HOSPITAL_COMMUNITY): Payer: Self-pay | Admitting: *Deleted

## 2012-05-29 ENCOUNTER — Encounter (HOSPITAL_COMMUNITY)
Admission: RE | Disposition: A | Discharge status: Home / Self Care | Payer: Self-pay | Source: Ambulatory Visit | Attending: Ophthalmology

## 2012-05-29 ENCOUNTER — Ambulatory Visit (HOSPITAL_COMMUNITY)
Admission: RE | Admit: 2012-05-29 | Discharge: 2012-05-29 | Disposition: A | Discharge status: Home / Self Care | Payer: Medicare Other | Source: Ambulatory Visit | Attending: Ophthalmology | Admitting: Ophthalmology

## 2012-05-29 DIAGNOSIS — H26499 Other secondary cataract, unspecified eye: Secondary | ICD-10-CM | POA: Insufficient documentation

## 2012-05-29 HISTORY — PX: YAG LASER APPLICATION: SHX6189

## 2012-05-29 SURGERY — TREATMENT, USING YAG LASER
Anesthesia: LOCAL | Laterality: Right

## 2012-05-29 MED ORDER — APRACLONIDINE HCL 1 % OP SOLN
1.0000 [drp] | Freq: Once | OPHTHALMIC | Status: AC
  Administered 2012-05-29: 1 [drp] via OPHTHALMIC

## 2012-05-29 MED ORDER — APRACLONIDINE HCL 1 % OP SOLN
OPHTHALMIC | Status: AC
  Filled 2012-05-29: qty 0.1

## 2012-05-29 MED ORDER — TROPICAMIDE 1 % OP SOLN
1.0000 [drp] | OPHTHALMIC | Status: AC
  Administered 2012-05-29 (×3): 1 [drp] via OPHTHALMIC

## 2012-05-29 MED ORDER — TROPICAMIDE 1 % OP SOLN
OPHTHALMIC | Status: AC
  Filled 2012-05-29: qty 3

## 2012-05-29 MED ORDER — TETRACAINE HCL 0.5 % OP SOLN
OPHTHALMIC | Status: AC
  Filled 2012-05-29: qty 2

## 2012-05-29 MED ORDER — APRACLONIDINE HCL 1 % OP SOLN
1.0000 [drp] | OPHTHALMIC | Status: AC
  Administered 2012-05-29 (×3): 1 [drp] via OPHTHALMIC

## 2012-05-29 MED ORDER — TETRACAINE HCL 0.5 % OP SOLN
1.0000 [drp] | Freq: Once | OPHTHALMIC | Status: AC
  Administered 2012-05-29: 1 [drp] via OPHTHALMIC

## 2012-05-29 NOTE — Brief Op Note (Signed)
Christopher Warner 05/29/2012  Susa Simmonds, MD  Yag Laser Self Test Completedyes. Procedure: Posterior Capsulotomy, right eye.  Eye Protection Worn by Staff yes. Laser In Use Sign on Door yes.  Laser: Nd:YAG Spot Size: Fixed Burst Mode: III Power Setting: 1.8-2.1 mJ/burst Position treated: posterior capsule Number of shots: 54 Total energy delivered: 110 mJ  The patient tolerated the procedure without difficulty. No complications were encountered.   Tenometer reading immediately after procedure: 9 mmHg.  The patient was discharged home with the instructions to continue all his current glaucoma medications, if any.   Patient verbalizes understanding of discharge instructions yes.   Notes:procedure difficult due to poor corneal surface due to dry eye.  heavily opacified posterior capsule

## 2012-05-29 NOTE — H&P (Signed)
I have reviewed the pre printed H&P, the patient was re-examined, and I have identified no significant interval changes in the patient's medical condition.  There is no change in the plan of care since the history and physical of record. 

## 2012-05-31 ENCOUNTER — Encounter (HOSPITAL_COMMUNITY): Payer: Self-pay | Admitting: Ophthalmology

## 2014-04-11 DIAGNOSIS — I10 Essential (primary) hypertension: Secondary | ICD-10-CM | POA: Diagnosis not present

## 2014-04-11 DIAGNOSIS — E785 Hyperlipidemia, unspecified: Secondary | ICD-10-CM | POA: Diagnosis not present

## 2014-04-15 DIAGNOSIS — I10 Essential (primary) hypertension: Secondary | ICD-10-CM | POA: Diagnosis not present

## 2014-04-15 DIAGNOSIS — E785 Hyperlipidemia, unspecified: Secondary | ICD-10-CM | POA: Diagnosis not present

## 2014-07-01 DIAGNOSIS — H3531 Nonexudative age-related macular degeneration: Secondary | ICD-10-CM | POA: Diagnosis not present

## 2014-07-01 DIAGNOSIS — H34812 Central retinal vein occlusion, left eye: Secondary | ICD-10-CM | POA: Diagnosis not present

## 2014-07-03 DIAGNOSIS — H538 Other visual disturbances: Secondary | ICD-10-CM | POA: Diagnosis not present

## 2014-07-03 DIAGNOSIS — H3531 Nonexudative age-related macular degeneration: Secondary | ICD-10-CM | POA: Diagnosis not present

## 2014-07-03 DIAGNOSIS — H04123 Dry eye syndrome of bilateral lacrimal glands: Secondary | ICD-10-CM | POA: Diagnosis not present

## 2014-09-16 DIAGNOSIS — M25569 Pain in unspecified knee: Secondary | ICD-10-CM | POA: Diagnosis not present

## 2014-09-17 ENCOUNTER — Other Ambulatory Visit (HOSPITAL_COMMUNITY): Payer: Self-pay | Admitting: Internal Medicine

## 2014-09-17 ENCOUNTER — Ambulatory Visit (HOSPITAL_COMMUNITY)
Admission: RE | Admit: 2014-09-17 | Discharge: 2014-09-17 | Disposition: A | Discharge status: Home / Self Care | Payer: Medicare Other | Source: Ambulatory Visit | Attending: Internal Medicine | Admitting: Internal Medicine

## 2014-09-17 DIAGNOSIS — M1711 Unilateral primary osteoarthritis, right knee: Secondary | ICD-10-CM | POA: Diagnosis not present

## 2014-09-17 DIAGNOSIS — M25561 Pain in right knee: Secondary | ICD-10-CM | POA: Diagnosis not present

## 2014-09-17 DIAGNOSIS — M7989 Other specified soft tissue disorders: Secondary | ICD-10-CM | POA: Insufficient documentation

## 2014-09-17 DIAGNOSIS — R52 Pain, unspecified: Secondary | ICD-10-CM

## 2014-09-19 ENCOUNTER — Ambulatory Visit (INDEPENDENT_AMBULATORY_CARE_PROVIDER_SITE_OTHER): Payer: Medicare Other | Admitting: Orthopedic Surgery

## 2014-09-19 VITALS — Wt 181.0 lb

## 2014-09-19 DIAGNOSIS — M1711 Unilateral primary osteoarthritis, right knee: Secondary | ICD-10-CM

## 2014-09-19 DIAGNOSIS — M129 Arthropathy, unspecified: Secondary | ICD-10-CM

## 2014-09-19 NOTE — Patient Instructions (Signed)
Finish your prescription

## 2014-09-19 NOTE — Progress Notes (Signed)
Patient ID: Christopher Warner, male   DOB: July 18, 1927, 81 y.o.   MRN: [id number] Chief Complaint  Patient presents with  . Knee Pain    Right knee pain and swelling for 6 months    81 year old male 6 month history of pain in his right knee which progressed to increased swelling where he couldn't bend or straighten his knee and had increased pain described as a dull ache. Pain is rated 8 out of 10 but responded to her prescription given by his primary care doctor. He does not know what it was. He took it for one or 2 days and the knee got better and is not having any symptoms at this time  System review he listed as normal  Medical history of hypertension  Previous surgery tonsillectomy 3 back operations and a right knee surgery  Current medications losartan simvastatin aspirin fish oil and other supplements  No allergies  Does not smoke or drink  Today's weight was 181. His appearance was normal is oriented 3 his mood is pleasant and walk without assistive devices as slight hunch to his back and flexion at his lumbosacral junction slight knee flexion both knees seems to be a chronic contracture  Knee flexion now is returned to what is normal for him he does have a small effusion is knee is stable his McMurray sign was negative  He had some mild skin abrasions sensation in his right leg was normal he had good pulse  His opposite knee had no swelling  We reviewed his x-ray and the report he does have 3 compartment arthritis some mild chondrocalcinosis    Impression knee effusion with underlying arthritis  Continue current medication and call us if any problems or symptoms recur

## 2014-10-09 DIAGNOSIS — E782 Mixed hyperlipidemia: Secondary | ICD-10-CM | POA: Diagnosis not present

## 2014-10-09 DIAGNOSIS — I10 Essential (primary) hypertension: Secondary | ICD-10-CM | POA: Diagnosis not present

## 2014-10-16 ENCOUNTER — Encounter (HOSPITAL_COMMUNITY): Payer: Self-pay | Admitting: Emergency Medicine

## 2014-10-16 ENCOUNTER — Emergency Department (HOSPITAL_COMMUNITY): Payer: Medicare Other

## 2014-10-16 ENCOUNTER — Emergency Department (HOSPITAL_COMMUNITY)
Admission: EM | Admit: 2014-10-16 | Discharge: 2014-10-16 | Disposition: A | Discharge status: Home / Self Care | Payer: Medicare Other | Attending: Emergency Medicine | Admitting: Emergency Medicine

## 2014-10-16 DIAGNOSIS — E780 Pure hypercholesterolemia: Secondary | ICD-10-CM | POA: Insufficient documentation

## 2014-10-16 DIAGNOSIS — I10 Essential (primary) hypertension: Secondary | ICD-10-CM | POA: Insufficient documentation

## 2014-10-16 DIAGNOSIS — Y998 Other external cause status: Secondary | ICD-10-CM | POA: Diagnosis not present

## 2014-10-16 DIAGNOSIS — Y9389 Activity, other specified: Secondary | ICD-10-CM | POA: Diagnosis not present

## 2014-10-16 DIAGNOSIS — K589 Irritable bowel syndrome without diarrhea: Secondary | ICD-10-CM | POA: Diagnosis not present

## 2014-10-16 DIAGNOSIS — W312XXA Contact with powered woodworking and forming machines, initial encounter: Secondary | ICD-10-CM | POA: Diagnosis not present

## 2014-10-16 DIAGNOSIS — S61002A Unspecified open wound of left thumb without damage to nail, initial encounter: Secondary | ICD-10-CM | POA: Diagnosis not present

## 2014-10-16 DIAGNOSIS — Z79899 Other long term (current) drug therapy: Secondary | ICD-10-CM | POA: Diagnosis not present

## 2014-10-16 DIAGNOSIS — Y9289 Other specified places as the place of occurrence of the external cause: Secondary | ICD-10-CM | POA: Insufficient documentation

## 2014-10-16 DIAGNOSIS — S61012A Laceration without foreign body of left thumb without damage to nail, initial encounter: Secondary | ICD-10-CM

## 2014-10-16 DIAGNOSIS — Z7982 Long term (current) use of aspirin: Secondary | ICD-10-CM | POA: Insufficient documentation

## 2014-10-16 MED ORDER — CEPHALEXIN 500 MG PO CAPS: 500.0000 mg | ORAL_CAPSULE | Freq: Four times a day (QID) | ORAL | Status: DC

## 2014-10-16 MED ORDER — CEFAZOLIN SODIUM 1-5 GM-% IV SOLN
1.0000 g | Freq: Once | INTRAVENOUS | Status: AC
  Administered 2014-10-16: 1 g via INTRAVENOUS
  Filled 2014-10-16: qty 50

## 2014-10-16 MED ORDER — OXYCODONE HCL 5 MG PO TABS
5.0000 mg | ORAL_TABLET | ORAL | Status: DC | PRN
Start: 2014-10-16 — End: 2015-08-28

## 2014-10-16 MED ORDER — LIDOCAINE HCL 2 % IJ SOLN
15.0000 mL | Freq: Once | INTRAMUSCULAR | Status: AC
  Administered 2014-10-16: 300 mg via INTRADERMAL
  Filled 2014-10-16: qty 20

## 2014-10-16 NOTE — ED Provider Notes (Signed)
CSN: [id number]     Arrival date & time 10/16/14  1400 History   First MD Initiated Contact with Patient 10/16/14 1455     Chief Complaint  Patient presents with  . Extremity Laceration     (Consider location/radiation/quality/duration/timing/severity/associated sxs/prior Treatment) Patient is a 81 y.o. male presenting with hand injury. The history is provided by the patient. No language interpreter was used.  Hand Injury Location:  Finger Injury: yes   Mechanism of injury comment:  Saw injury Finger location:  L thumb Pain details:    Quality:  Aching   Radiates to:  Does not radiate   Severity:  Moderate   Onset quality:  Sudden   Timing:  Constant   Progression:  Improving Chronicity:  New Handedness:  Right-handed Dislocation: no   Foreign body present:  No foreign bodies Tetanus status:  Up to date Prior injury to area:  No Relieved by:  Being still Worsened by:  Stretching area and movement Ineffective treatments:  None tried Associated symptoms: no back pain, no decreased range of motion, no fatigue, no fever, no neck pain, no numbness and no stiffness     Past Medical History  Diagnosis Date  . Hypertension   . High cholesterol   . IBS (irritable bowel syndrome)    Past Surgical History  Procedure Laterality Date  . Back surgeries  x    x 3  . Knee cartilage surgery    . Toe surgery    . Elbow surgery    . Shoulder surgery      rotator cuff repair (rt)  . Tonsillectomy    . Yag laser application Right 05/29/2012    Procedure: YAG LASER APPLICATION;  Surgeon: Susa Simmonds, MD;  Location: [facility];  Service: Ophthalmology;  Laterality: Right;   History reviewed. No pertinent family history. History  Substance Use Topics  . Smoking status: Never Smoker   . Smokeless tobacco: Not on file  . Alcohol Use: No    Review of Systems  Constitutional: Negative for fever and fatigue.  Respiratory: Negative for chest tightness and shortness of breath.     Cardiovascular: Negative for chest pain.  Gastrointestinal: Negative for nausea, vomiting and abdominal pain.  Musculoskeletal: Negative for back pain, gait problem, stiffness and neck pain.  Skin: Positive for wound.  Neurological: Negative for light-headedness and headaches.  Psychiatric/Behavioral: Negative for confusion.  All other systems reviewed and are negative.     Allergies  Sulfa antibiotics  Home Medications   Prior to Admission medications   Medication Sig Start Date End Date Taking? Authorizing Provider  amLODipine (NORVASC) 10 MG tablet Take 10 mg by mouth daily. 08/13/14  Yes Historical Provider, MD  aspirin 81 MG tablet Take 81 mg by mouth daily.     Yes Historical Provider, MD  Calcium Carb-Cholecalciferol (CALCIUM 600 + D) 600-200 MG-UNIT TABS Take 1 tablet by mouth 2 (two) times daily.   Yes Historical Provider, MD  diclofenac (VOLTAREN) 75 MG EC tablet Take 75 mg by mouth 2 (two) times daily. 09/16/14  Yes Historical Provider, MD  fish oil-omega-3 fatty acids 1000 MG capsule Take 1 g by mouth 2 (two) times daily.   Yes Historical Provider, MD  losartan (COZAAR) 50 MG tablet Take 50 mg by mouth daily.     Yes Historical Provider, MD  Multiple Vitamin (MULTIVITAMIN) tablet Take 1 tablet by mouth daily.     Yes Historical Provider, MD  Multiple Vitamins-Minerals (PRESERVISION AREDS 2 PO) Take  1 tablet by mouth 2 (two) times daily.    Yes Historical Provider, MD  multivitamin-lutein (OCUVITE-LUTEIN) CAPS Take 1 capsule by mouth at bedtime.    Yes Historical Provider, MD  OVER THE COUNTER MEDICATION Take 1 tablet by mouth daily. Digestive Advantage Tablets   Yes Historical Provider, MD  polycarbophil (FIBERCON) 625 MG tablet Take 1,250 mg by mouth 2 (two) times daily.   Yes Historical Provider, MD  Probiotic Product (PHILLIPS COLON HEALTH) CAPS Take by mouth at bedtime.   Yes Historical Provider, MD  psyllium (METAMUCIL) 58.6 % powder Take 1 packet by mouth 2 (two) times  daily.   Yes Historical Provider, MD  simvastatin (ZOCOR) 40 MG tablet Take 40 mg by mouth at bedtime.     Yes Historical Provider, MD   BP 152/77 mmHg  Pulse 66  Temp(Src) 97.5 F (36.4 C) (Oral)  Resp 18  Ht 5\' 11"  (1.803 m)  Wt 184 lb (83.462 kg)  BMI 25.67 kg/m2  SpO2 99%   Physical Exam  Constitutional: He is oriented to person, place, and time. He appears well-developed and well-nourished. No distress.  HENT:  Head: Normocephalic and atraumatic.  Nose: Nose normal.  Mouth/Throat: Oropharynx is clear and moist. No oropharyngeal exudate.  Eyes: EOM are normal. Pupils are equal, round, and reactive to light.  Neck: Normal range of motion. Neck supple.  Cardiovascular: Normal rate, regular rhythm, normal heart sounds and intact distal pulses.   No murmur heard. Pulmonary/Chest: Effort normal and breath sounds normal. No respiratory distress. He has no wheezes. He exhibits no tenderness.  Abdominal: Soft. He exhibits no distension. There is no tenderness. There is no guarding.  Musculoskeletal: Normal range of motion. He exhibits no tenderness.       Hands: Neurological: He is alert and oriented to person, place, and time. No cranial nerve deficit. Coordination normal.  Skin: Skin is warm and dry. He is not diaphoretic. No pallor.  Psychiatric: He has a normal mood and affect. His behavior is normal. Judgment and thought content normal.  Nursing note and vitals reviewed.   ED Course  LACERATION REPAIR Date/Time: 10/16/2014 1:40 PM Performed by: Lenell Antu Authorized by: Elwin Mocha Consent: Verbal consent obtained. Risks and benefits: risks, benefits and alternatives were discussed Consent given by: patient Required items: required blood products, implants, devices, and special equipment available Patient identity confirmed: verbally with patient Time out: Immediately prior to procedure a "time out" was called to verify the correct patient, procedure, equipment,  support staff and site/side marked as required. Body area: upper extremity Location details: left thumb Laceration length: 4 cm Foreign bodies: no foreign bodies Tendon involvement: none Nerve involvement: none Vascular damage: no Anesthesia: digital block Local anesthetic: lidocaine 2% without epinephrine Anesthetic total: 3 ml Patient sedated: no Preparation: Patient was prepped and draped in the usual sterile fashion. Irrigation solution: saline Irrigation method: syringe Amount of cleaning: extensive Debridement: minimal Skin closure: 4-0 Prolene Subcutaneous closure: 5-0 Chromic gut Number of sutures: 17 Technique: simple, vertical mattress and running Approximation: loose Approximation difficulty: complex Dressing: antibiotic ointment and 4x4 sterile gauze Patient tolerance: Patient tolerated the procedure well with no immediate complications   (including critical care time) Labs Review Labs Reviewed - No data to display  Imaging Review Dg Hand Complete Left  10/16/2014   CLINICAL DATA:  Thumb laceration with table cell yesterday. Thumb pain and swelling. Initial encounter.  EXAM: LEFT HAND - COMPLETE 3+ VIEW  COMPARISON:  None.  FINDINGS: There is  no evidence of fracture or dislocation. No evidence of radiopaque foreign body. Soft tissue swelling is seen involving the distal thumb.  Osteoarthritis is seen involving the metacarpophalangeal joints and interphalangeal joints, with distal predominance. Mild osteoarthritis also seen involving scaphoid-trapezium -joint complex.  IMPRESSION: Distal thumb soft tissue swelling. No evidence of fracture or radiopaque foreign body.  Osteoarthritis.   Electronically Signed   By: Myles RosenthalJohn  Stahl M.D.   On: 10/16/2014 15:50     EKG Interpretation None      MDM   Final diagnoses:  Thumb laceration, left, initial encounter  Contact with powered saw as cause of accidental injury   Pt is a 81 yo M with hx of HTN who presents with a left  thumb laceration.  Was cutting wood on a table saw at 1200 today when he lost control of the wood, and cut his left thumb.  Has a 4 cm laceration at the left thumb at midline on the flexor aspect that is deep, with concern for bony involvement.  Currently hemostatic.  Went to his PCP this afternoon and received a tetanus shot and had the wound washed out, then was sent to the ED to reportedly see Dr. Orlan Leavensrtman from hand surgery.  4/10 pain on arrival, but declined pain meds.  Takes ASA 81 daily but denies other anticoagulation.  Given IV ancef and left hand xray was obtained.   Hand surgery (Dr. Orlan Leavensrtman) called to discuss repair.   He evaluated the xrays and agreed that patient would be appropriate for ED wash out and for our team to suture the wound closed.    Left thumb digital block with 2% lidocaine, which patient tolerated well.  His wound was irrigated copiously with NS.  Minimal debridement was done to remove some macerated tissue and skin.  Left thumb nail avulsed on radial aspect, but nailbed still intact. The ~4 cm laceration extends from distal tip of thumb down the midline thumb ventral aspect to the PIP joint, and from the distal tip to the radial aspect of the dorsal thumb underneath lateral aspect of nail.   Closed with 5 x subQ with 5-0 fast absorbing simple interrupted sutures and 12 x simple interrupted, vertical mattress, and running sutures on the external aspect, with 4-0 prolene.  He had a significant amount of skin that was torn away from his thumb, so some of the muscle/fat of his thumb pad is exposed.  The wound edges were approximated as closely as possible to allow healing and to prevent deep tissue infection.    Discussed wound care and advised to keep his hand elevated.  Discharged with Rx's for roxicodone and keflex.  To f/u with Dr. Orlan Leavensrtman in hand clinic within 5 days.  All questions were answered and ED return precautions were discussed in detail.  Discharged in stable condition.      Imaging was reviewed and interpreted by myself and my attending, and incorporated in the medical decision making.  Patient was seen with ED Attending, Dr. Ginette PitmanWalden  Vanita Cannell, MD   Lenell AntuJamie Torrey Horseman, MD 10/17/14 1341  Elwin MochaBlair Walden, MD 10/17/14 2325

## 2014-10-16 NOTE — ED Notes (Signed)
Spoke to Dr Orlan Leavensrtman and sts EDP to see pt and then consult if needed

## 2014-10-16 NOTE — ED Notes (Signed)
Pt sent here by PCP to see Dr Orlan Leavensrtman for laceration to left thumb; CMS intact and bleeding controlled; pt sts cut with skill saw

## 2014-10-22 DIAGNOSIS — S62522B Displaced fracture of distal phalanx of left thumb, initial encounter for open fracture: Secondary | ICD-10-CM | POA: Diagnosis not present

## 2014-10-29 DIAGNOSIS — S62522D Displaced fracture of distal phalanx of left thumb, subsequent encounter for fracture with routine healing: Secondary | ICD-10-CM | POA: Diagnosis not present

## 2014-11-18 DIAGNOSIS — S62522D Displaced fracture of distal phalanx of left thumb, subsequent encounter for fracture with routine healing: Secondary | ICD-10-CM | POA: Diagnosis not present

## 2015-01-01 DIAGNOSIS — Z23 Encounter for immunization: Secondary | ICD-10-CM | POA: Diagnosis not present

## 2015-01-06 DIAGNOSIS — S62522D Displaced fracture of distal phalanx of left thumb, subsequent encounter for fracture with routine healing: Secondary | ICD-10-CM | POA: Diagnosis not present

## 2015-03-28 DIAGNOSIS — M1711 Unilateral primary osteoarthritis, right knee: Secondary | ICD-10-CM | POA: Diagnosis not present

## 2015-04-01 DIAGNOSIS — H348122 Central retinal vein occlusion, left eye, stable: Secondary | ICD-10-CM | POA: Diagnosis not present

## 2015-04-01 DIAGNOSIS — H353221 Exudative age-related macular degeneration, left eye, with active choroidal neovascularization: Secondary | ICD-10-CM | POA: Diagnosis not present

## 2015-04-01 DIAGNOSIS — H353134 Nonexudative age-related macular degeneration, bilateral, advanced atrophic with subfoveal involvement: Secondary | ICD-10-CM | POA: Diagnosis not present

## 2015-04-01 DIAGNOSIS — H353212 Exudative age-related macular degeneration, right eye, with inactive choroidal neovascularization: Secondary | ICD-10-CM | POA: Diagnosis not present

## 2015-05-06 DIAGNOSIS — H353221 Exudative age-related macular degeneration, left eye, with active choroidal neovascularization: Secondary | ICD-10-CM | POA: Diagnosis not present

## 2015-06-09 ENCOUNTER — Ambulatory Visit (INDEPENDENT_AMBULATORY_CARE_PROVIDER_SITE_OTHER): Payer: Medicare Other

## 2015-06-09 ENCOUNTER — Encounter: Payer: Self-pay | Admitting: Orthopedic Surgery

## 2015-06-09 ENCOUNTER — Ambulatory Visit (INDEPENDENT_AMBULATORY_CARE_PROVIDER_SITE_OTHER): Payer: Medicare Other | Admitting: Orthopedic Surgery

## 2015-06-09 VITALS — BP 109/79 | Ht 71.0 in

## 2015-06-09 DIAGNOSIS — M25562 Pain in left knee: Secondary | ICD-10-CM

## 2015-06-09 DIAGNOSIS — M1712 Unilateral primary osteoarthritis, left knee: Secondary | ICD-10-CM | POA: Diagnosis not present

## 2015-06-09 NOTE — Progress Notes (Signed)
Chief Complaint  Patient presents with  . Knee Problem    left knee pain x 1 week   HPI  Presents for evaluation of left knee  History left knee pain and swelling 1 week. Patient walks 2 miles per day. Had plain in the right knee treated with medication from his primary care doctor however 10 work on his left knee  Quality of pain dull ache. Severity mild to moderate. Timing constant. Context no trauma.  Review of Systems  Constitutional: Negative for fever and chills.  Musculoskeletal: Positive for joint pain.    Past Medical History  Diagnosis Date  . Hypertension   . High cholesterol   . IBS (irritable bowel syndrome)     Past Surgical History  Procedure Laterality Date  . Back surgeries  x    x 3  . Knee cartilage surgery    . Toe surgery    . Elbow surgery    . Shoulder surgery      rotator cuff repair (rt)  . Tonsillectomy    . Yag laser application Right 05/29/2012    Procedure: YAG LASER APPLICATION;  Surgeon: Susa Simmonds, MD;  Location: [facility];  Service: Ophthalmology;  Laterality: Right;   No family history on file. Social History  Substance Use Topics  . Smoking status: Never Smoker   . Smokeless tobacco: Not on file  . Alcohol Use: No    Current outpatient prescriptions:  .  amLODipine (NORVASC) 10 MG tablet, Take 10 mg by mouth daily., Disp: , Rfl:  .  aspirin 81 MG tablet, Take 81 mg by mouth daily.  , Disp: , Rfl:  .  fish oil-omega-3 fatty acids 1000 MG capsule, Take 1 g by mouth 2 (two) times daily. Reported on 06/09/2015, Disp: , Rfl:  .  losartan (COZAAR) 50 MG tablet, Take 50 mg by mouth daily.  , Disp: , Rfl:  .  multivitamin-lutein (OCUVITE-LUTEIN) CAPS, Take 1 capsule by mouth at bedtime. Reported on 06/09/2015, Disp: , Rfl:  .  Calcium Carb-Cholecalciferol (CALCIUM 600 + D) 600-200 MG-UNIT TABS, Take 1 tablet by mouth 2 (two) times daily. Reported on 06/09/2015, Disp: , Rfl:  .  cephALEXin (KEFLEX) 500 MG capsule, Take 1 capsule (500 mg  total) by mouth 4 (four) times daily. (Patient not taking: Reported on 06/09/2015), Disp: 28 capsule, Rfl: 0 .  diclofenac (VOLTAREN) 75 MG EC tablet, Take 75 mg by mouth 2 (two) times daily. Reported on 06/09/2015, Disp: , Rfl:  .  Multiple Vitamin (MULTIVITAMIN) tablet, Take 1 tablet by mouth daily. Reported on 06/09/2015, Disp: , Rfl:  .  Multiple Vitamins-Minerals (PRESERVISION AREDS 2 PO), Take 1 tablet by mouth 2 (two) times daily. Reported on 06/09/2015, Disp: , Rfl:  .  OVER THE COUNTER MEDICATION, Take 1 tablet by mouth daily. Reported on 06/09/2015, Disp: , Rfl:  .  oxyCODONE (ROXICODONE) 5 MG immediate release tablet, Take 1 tablet (5 mg total) by mouth every 4 (four) hours as needed for severe pain. (Patient not taking: Reported on 06/09/2015), Disp: 20 tablet, Rfl: 0 .  polycarbophil (FIBERCON) 625 MG tablet, Take 1,250 mg by mouth 2 (two) times daily. Reported on 06/09/2015, Disp: , Rfl:  .  Probiotic Product (PHILLIPS COLON HEALTH) CAPS, Take by mouth at bedtime. Reported on 06/09/2015, Disp: , Rfl:  .  psyllium (METAMUCIL) 58.6 % powder, Take 1 packet by mouth 2 (two) times daily. Reported on 06/09/2015, Disp: , Rfl:  .  simvastatin (ZOCOR) 40 MG  tablet, Take 40 mg by mouth at bedtime. Reported on 06/09/2015, Disp: , Rfl:   There were no vitals taken for this visit.  Physical Exam  Constitutional: He is oriented to person, place, and time. He appears well-developed and well-nourished. No distress.  Cardiovascular: Normal rate and intact distal pulses.   Musculoskeletal:  Normal gait  Neurological: He is alert and oriented to person, place, and time.  Skin: Skin is warm and dry. No rash noted. He is not diaphoretic. No erythema. No pallor.  Psychiatric: He has a normal mood and affect. His behavior is normal. Judgment and thought content normal.    Right Knee Exam   Tenderness  The patient is experiencing no tenderness.     Muscle Strength   The patient has normal right knee  strength.  Tests  Drawer:       Anterior - negative    Posterior - negative  Other  Erythema: absent Sensation: normal   Left Knee Exam   Tenderness  The patient is experiencing tenderness in the lateral joint line and medial joint line.  Range of Motion  Extension: -5  Flexion: 110   Muscle Strength   The patient has normal left knee strength.  Tests  McMurray:  Medial - negative Lateral - negative Drawer:       Anterior - negative     Posterior - negative Varus: negative Valgus: negative  Other  Erythema: absent Sensation: normal Pulse: present Swelling: mild       ASSESSMENT: My personal interpretation of the images:  Severe arthritis left knee with varus alignment  Primary osteoarthritis left knee  PLAN Inject left knee turned one month  Procedure note left knee injection verbal consent was obtained to inject left knee joint  Timeout was completed to confirm the site of injection  The medications used were 40 mg of Depo-Medrol and 1% lidocaine 3 cc  Anesthesia was provided by ethyl chloride and the skin was prepped with alcohol.  After cleaning the skin with alcohol a 20-gauge needle was used to inject the left knee joint. There were no complications. A sterile bandage was applied.

## 2015-06-09 NOTE — Patient Instructions (Signed)

## 2015-06-17 DIAGNOSIS — H353221 Exudative age-related macular degeneration, left eye, with active choroidal neovascularization: Secondary | ICD-10-CM | POA: Diagnosis not present

## 2015-06-17 DIAGNOSIS — H353134 Nonexudative age-related macular degeneration, bilateral, advanced atrophic with subfoveal involvement: Secondary | ICD-10-CM | POA: Diagnosis not present

## 2015-07-14 ENCOUNTER — Ambulatory Visit (INDEPENDENT_AMBULATORY_CARE_PROVIDER_SITE_OTHER): Payer: Medicare Other | Admitting: Orthopedic Surgery

## 2015-07-14 VITALS — BP 173/75 | HR 61 | Ht 71.0 in | Wt 184.0 lb

## 2015-07-14 DIAGNOSIS — M1712 Unilateral primary osteoarthritis, left knee: Secondary | ICD-10-CM | POA: Diagnosis not present

## 2015-07-14 NOTE — Progress Notes (Signed)
Patient ID: Christopher Warner, male   DOB: [birth year], 82 y.o.   MRN: [id number]  Chief Complaint  Patient presents with  . 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    ROS-normal appears to be in very good health  Past Medical History  Diagnosis Date  . Hypertension   . High cholesterol   . IBS (irritable bowel syndrome)      BP 173/75 mmHg  Pulse 61  Ht 5\' 11"  (1.803 m)  Wt 184 lb (83.462 kg)  BMI 25.67 kg/m2  Physical Exam  Constitutional: He is oriented to person, place, and time. He appears well-developed and well-nourished. No distress.  Cardiovascular: Intact distal pulses.   Musculoskeletal:  His gait is fairly good as cadence is slow stride length is short and he has slight flexion of the spine  Neurological: He is alert and oriented to person, place, and time.  Skin: Skin is warm and dry. No rash noted. He is not diaphoretic. No erythema. No pallor.  Psychiatric: He has a normal mood and affect. His behavior is normal. Judgment and thought content normal.    Ortho Exam  Left knee tenderness medial joint line small effusion knee is stable bends it fairly well  ASSESSMENT AND PLAN   Discussed possible knee replacement I told him to think it over for the next month talk with his family and he will have to tell me that he can live with his knee the way it is due to the surgery

## 2015-07-21 ENCOUNTER — Telehealth: Payer: Self-pay | Admitting: Orthopedic Surgery

## 2015-07-21 NOTE — Telephone Encounter (Signed)
Yes tka

## 2015-07-21 NOTE — Telephone Encounter (Signed)
Ok to proceed with scheduling.  

## 2015-07-21 NOTE — Telephone Encounter (Signed)
Patient called back as discussed at last office visit 07/14/15 and would like to proceed with scheduling of left knee surgery, and would like to have it in June, approximately the first full week.  Please advise.  Ph# [phone]

## 2015-07-22 ENCOUNTER — Other Ambulatory Visit: Payer: Self-pay | Admitting: *Deleted

## 2015-07-23 ENCOUNTER — Telehealth: Payer: Self-pay | Admitting: Orthopedic Surgery

## 2015-07-23 NOTE — Telephone Encounter (Signed)
Duplicate message. 

## 2015-07-23 NOTE — Telephone Encounter (Signed)
Patient aware that surgery has been scheduled and has surgery, pre op , and post op dates

## 2015-07-23 NOTE — Telephone Encounter (Signed)
Mr. Christopher Warner called again wanting to know if we can get him set up for surgery.  I told him that I would send the message and have you call him.

## 2015-07-30 ENCOUNTER — Telehealth: Payer: Self-pay | Admitting: *Deleted

## 2015-07-30 NOTE — Telephone Encounter (Signed)
I called [facility] regarding pre-cert for surgery scheduled for 09/03/15 left Total Knee Replacement CPT [phone] and no pre-cert is required until time of admission [facility]

## 2015-08-04 NOTE — Telephone Encounter (Signed)
NO CLEARANCE FROM PCP NECESSARY   CALLED PATIENT, NO ANSWER, LEFT VM

## 2015-08-04 NOTE — Telephone Encounter (Signed)
Patient and patient's son (on contact list) Christopher Warner  [phone], called to inquire -- regarding surgery for total right knee replacement scheduled 09/03/15 -- would any clearance from primary care, Dr. Catalina PizzaZach Hall be required?  Patient's pre-op is scheduled for 08/28/15.  Please advise. Patient's ph# [phone]

## 2015-08-05 DIAGNOSIS — H353134 Nonexudative age-related macular degeneration, bilateral, advanced atrophic with subfoveal involvement: Secondary | ICD-10-CM | POA: Diagnosis not present

## 2015-08-05 DIAGNOSIS — H353221 Exudative age-related macular degeneration, left eye, with active choroidal neovascularization: Secondary | ICD-10-CM | POA: Diagnosis not present

## 2015-08-05 NOTE — Telephone Encounter (Signed)
CALLED PATIENT, NO ANSWER, LEFT VM 

## 2015-08-27 NOTE — Patient Instructions (Signed)
Christopher Warner  08/27/2015     @PREFPERIOPPHARMACY @   Your procedure is scheduled on 09/03/2015.  Report to [facility] at 6:15 A.M.  Call this number if you have problems the morning of surgery:  [phone]   Remember:  Do not eat food or drink liquids after midnight.  Take these medicines the morning of surgery with A SIP OF WATER:  NORVASC AND COZAAR   Do not wear jewelry, make-up or nail polish.  Do not wear lotions, powders, or perfumes.  You may wear deodorant.  Do not shave 48 hours prior to surgery.  Men may shave face and neck.  Do not bring valuables to the hospital.  [facility] is not responsible for any belongings or valuables.  Contacts, dentures or bridgework may not be worn into surgery.  Leave your suitcase in the car.  After surgery it may be brought to your room.  For patients admitted to the hospital, discharge time will be determined by your treatment team.  Patients discharged the day of surgery will not be allowed to drive home.   Name and phone number of your driver:   FAMILY Special instructions:  N/A  Please read over the following fact sheets that you were given. Care and Recovery After Surgery    General Anesthesia, Adult General anesthesia is a sleep-like state of non-feeling produced by medicines (anesthetics). General anesthesia prevents you from being alert and feeling pain during a medical procedure. Your caregiver may recommend general anesthesia if your procedure:  Is long.  Is painful or uncomfortable.  Would be frightening to see or hear.  Requires you to be still.  Affects your breathing.  Causes significant blood loss. LET YOUR CAREGIVER KNOW ABOUT:  Allergies to food or medicine.  Medicines taken, including vitamins, herbs, eyedrops, over-the-counter medicines, and creams.  Use of steroids (by mouth or creams).  Previous problems with anesthetics or numbing medicines, including problems experienced by  relatives.  History of bleeding problems or blood clots.  Previous surgeries and types of anesthetics received.  Possibility of pregnancy, if this applies.  Use of cigarettes, alcohol, or illegal drugs.  Any health condition(s), especially diabetes, sleep apnea, and high blood pressure. RISKS AND COMPLICATIONS General anesthesia rarely causes complications. However, if complications do occur, they can be life threatening. Complications include:  A lung infection.  A stroke.  A heart attack.  Waking up during the procedure. When this occurs, the patient may be unable to move and communicate that he or she is awake. The patient may feel severe pain. Older adults and adults with serious medical problems are more likely to have complications than adults who are young and healthy. Some complications can be prevented by answering all of your caregiver's questions thoroughly and by following all pre-procedure instructions. It is important to tell your caregiver if any of the pre-procedure instructions, especially those related to diet, were not followed. Any food or liquid in the stomach can cause problems when you are under general anesthesia. BEFORE THE PROCEDURE  Ask your caregiver if you will have to spend the night at the hospital. If you will not have to spend the night, arrange to have an adult drive you and stay with you for 24 hours.  Follow your caregiver's instructions if you are taking dietary supplements or medicines. Your caregiver may tell you to stop taking them or to reduce your dosage.  Do not smoke for as long as possible before your procedure. If possible,  stop smoking 3-6 weeks before the procedure.  Do not take new dietary supplements or medicines within 1 week of your procedure unless your caregiver approves them.  Do not eat within 8 hours of your procedure or as directed by your caregiver. Drink only clear liquids, such as water, black coffee (without milk or cream),  and fruit juices (without pulp).  Do not drink within 3 hours of your procedure or as directed by your caregiver.  You may brush your teeth on the morning of the procedure, but make sure to spit out the toothpaste and water when finished. PROCEDURE  You will receive anesthetics through a mask, through an intravenous (IV) access tube, or through both. A doctor who specializes in anesthesia (anesthesiologist) or a nurse who specializes in anesthesia (nurse anesthetist) or both will stay with you throughout the procedure to make sure you remain unconscious. He or she will also watch your blood pressure, pulse, and oxygen levels to make sure that the anesthetics do not cause any problems. Once you are asleep, a breathing tube or mask may be used to help you breathe. AFTER THE PROCEDURE You will wake up after the procedure is complete. You may be in the room where the procedure was performed or in a recovery area. You may have a sore throat if a breathing tube was used. You may also feel:  Dizzy.  Weak.  Drowsy.  Confused.  Nauseous.  Cold. These are all normal responses and can be expected to last for up to 24 hours after the procedure is complete. A caregiver will tell you when you are ready to go home. This will usually be when you are fully awake and in stable condition.   This information is not intended to replace advice given to you by your health care provider. Make sure you discuss any questions you have with your health care provider.   Document Released: 06/22/2007 Document Revised: 04/05/2014 Document Reviewed: 07/14/2011 Elsevier Interactive Patient Education 2016 [employer] Total Knee Replacement Total knee replacement is a procedure to replace your knee joint with an artificial knee joint (prosthetic knee joint). The purpose of this surgery is to reduce pain and improve your knee function. LET [facility] LLC[facility]OUR HEALTH CARE PROVIDER KNOW ABOUT:   Any allergies you have.  All  medicines you are taking, including vitamins, herbs, eye drops, creams, and over-the-counter medicines.  Previous problems you or members of your family have had with the use of anesthetics.  Any blood disorders you have.  Previous surgeries you have had.  Medical conditions you have. RISKS AND COMPLICATIONS  Generally, total knee replacement is a safe procedure. However, problems can occur, including:  Loss of range of motion of the knee or instability of the knee.  Loosening of the prosthesis.  Infection.  Persistent pain. BEFORE THE PROCEDURE   Plan to have someone take you home after the procedure.  Do not eat or drink anything after midnight on the night before the procedure or as directed by your health care provider.  Ask your health care provider about:  Changing or stopping your regular medicines. This is especially important if you are taking diabetes medicines or blood thinners.  Taking medicines such as aspirin and ibuprofen. These medicines can thin your blood. Do not take these medicines before your procedure if your health care provider asks you not to.  Ask your health care provider about how your surgical site will be marked or identified.  You may be given antibiotic medicines  to help prevent infection. PROCEDURE   To reduce your risk of infection:  Your health care team will wash or sanitize their hands.  Your skin will be washed with soap.  An IV tube will be inserted into one of your veins. You will be given one or more of the following:  A medicine that makes you drowsy (sedative).  A medicine that makes you fall asleep (general anesthetic).  A medicine injected into your spine that numbs your body below the waist (spinal anesthetic).  A medicine to block feeling in your leg (nerve block) to help ease pain after surgery.  An incision will be made in your knee. Your surgeon will take out any damaged cartilage and bone by sawing off the damaged  surfaces.  The surgeon will then put a new metal liner over the sawed-off portion of your thigh bone (femur) and a plastic liner over the sawed-off portion of one of the bones of your lower leg (tibia). This is to restore alignment and function to your knee. A plastic piece is often used to restore the surface of your knee cap. AFTER THE PROCEDURE   You will stay in a recovery area until your medicines wear off.  You may have drainage tubes to drain excess fluid from your knee. These tubes attach to a device that removes these fluids.  Once you are awake, stable, and taking fluids well, you will be taken to your hospital room.  You may be directed to take actions to help prevent blood clots. These may include:  Walking shortly after surgery, with someone assisting you. Moving around after surgery helps to improve blood flow.  Wearing compression stockings or using different types of devices.  You will receive physical therapy as prescribed by your health care provider.   This information is not intended to replace advice given to you by your health care provider. Make sure you discuss any questions you have with your health care provider.   Document Released: 06/21/2000 Document Revised: 12/04/2014 Document Reviewed: 04/25/2011 Elsevier Interactive Patient Education [employer]

## 2015-08-28 ENCOUNTER — Encounter (HOSPITAL_COMMUNITY)
Admission: RE | Admit: 2015-08-28 | Discharge: 2015-08-28 | Disposition: A | Discharge status: Home / Self Care | Payer: Medicare Other | Source: Ambulatory Visit | Attending: Orthopedic Surgery | Admitting: Orthopedic Surgery

## 2015-08-28 ENCOUNTER — Other Ambulatory Visit: Payer: Self-pay

## 2015-08-28 ENCOUNTER — Encounter (HOSPITAL_COMMUNITY): Payer: Self-pay

## 2015-08-28 DIAGNOSIS — Z01810 Encounter for preprocedural cardiovascular examination: Secondary | ICD-10-CM | POA: Insufficient documentation

## 2015-08-28 DIAGNOSIS — Z01812 Encounter for preprocedural laboratory examination: Secondary | ICD-10-CM | POA: Diagnosis not present

## 2015-08-28 HISTORY — DX: Unspecified osteoarthritis, unspecified site: M19.90

## 2015-08-28 HISTORY — DX: Unspecified macular degeneration: H35.30

## 2015-08-28 LAB — BASIC METABOLIC PANEL WITH GFR
Anion gap: 6 (ref 5–15)
BUN: 20 mg/dL (ref 6–20)
CO2: 26 mmol/L (ref 22–32)
Calcium: 9 mg/dL (ref 8.9–10.3)
Chloride: 106 mmol/L (ref 101–111)
Creatinine, Ser: 1 mg/dL (ref 0.61–1.24)
GFR calc Af Amer: >60 mL/min
GFR calc non Af Amer: >60 mL/min
Glucose, Bld: 115 mg/dL — ABNORMAL HIGH (ref 65–99)
Potassium: 4.1 mmol/L (ref 3.5–5.1)
Sodium: 138 mmol/L (ref 135–145)

## 2015-08-28 LAB — CBC WITH DIFFERENTIAL/PLATELET
Basophils Absolute: 0 10*3/uL (ref 0.0–0.1)
Basophils Relative: 0 %
Eosinophils Absolute: 0.1 10*3/uL (ref 0.0–0.7)
Eosinophils Relative: 1 %
HEMATOCRIT: 42.4 % (ref 39.0–52.0)
HEMOGLOBIN: 14 g/dL (ref 13.0–17.0)
LYMPHS ABS: 1.1 10*3/uL (ref 0.7–4.0)
LYMPHS PCT: 16 %
MCH: 31.7 pg (ref 26.0–34.0)
MCHC: 33 g/dL (ref 30.0–36.0)
MCV: 95.9 fL (ref 78.0–100.0)
MONOS PCT: 7 %
Monocytes Absolute: 0.5 10*3/uL (ref 0.1–1.0)
NEUTROS ABS: 5.1 10*3/uL (ref 1.7–7.7)
NEUTROS PCT: 76 %
Platelets: 219 10*3/uL (ref 150–400)
RBC: 4.42 MIL/uL (ref 4.22–5.81)
RDW: 13.2 % (ref 11.5–15.5)
WBC: 6.8 10*3/uL (ref 4.0–10.5)

## 2015-08-28 LAB — PREPARE RBC (CROSSMATCH)

## 2015-08-28 LAB — PROTIME-INR
INR: 1.08 (ref 0.00–1.49)
Prothrombin Time: 14.2 s (ref 11.6–15.2)

## 2015-08-28 LAB — SURGICAL PCR SCREEN
MRSA, PCR: NEGATIVE
Staphylococcus aureus: NEGATIVE

## 2015-08-28 LAB — APTT: aPTT: 23 s — ABNORMAL LOW (ref 24–37)

## 2015-08-28 LAB — ABO/RH: ABO/RH(D): O POS

## 2015-09-02 MED ORDER — TRANEXAMIC ACID 1000 MG/10ML IV SOLN
1000.0000 mg | INTRAVENOUS | Status: AC
  Administered 2015-09-03: 1000 mg via INTRAVENOUS
  Filled 2015-09-02: qty 10

## 2015-09-02 NOTE — H&P (Signed)
TOTAL KNEE ADMISSION H&P  Patient is being admitted for left total knee arthroplasty.  Subjective:  Chief Complaint:left knee pain.  HPI: Christopher Warner, 82 y.o. male, has a history of pain and functional disability in the left knee due to arthritis and has failed non-surgical conservative treatments for greater than 12 weeks to includeNSAID's and/or analgesics, corticosteriod injections, supervised PT with diminished ADL's post treatment, use of assistive devices and activity modification.  Onset of symptoms was gradual, starting >1 year ago years ago with gradually worsening course since that time. The patient noted prior procedures on the knee to include  arthroscopy on the left knee(s).  Patient currently rates pain in the left knee(s) at 5 out of 10 with activity. Patient has worsening of pain with activity and weight bearing, pain that interferes with activities of daily living, pain with passive range of motion and crepitus.  Patient has evidence of subchondral sclerosis, periarticular osteophytes and joint space narrowing by imaging studies. This patient has not had failure of previous osteotomy  Patient Active Problem List   Diagnosis Date Noted  . EDEMA 02/03/2010  . ACHILLES TENDINITIS 10/02/2009  . RUPTURE ROTATOR CUFF 03/25/2009  . SHOULDER PAIN 03/06/2009  . IMPINGEMENT SYNDROME 03/06/2009   Past Medical History  Diagnosis Date  . Hypertension   . High cholesterol   . IBS (irritable bowel syndrome)   . Arthritis   . Macular degeneration disease     Past Surgical History  Procedure Laterality Date  . Back surgeries  x    x 3  . Knee cartilage surgery    . Toe surgery    . Elbow surgery    . Shoulder surgery      rotator cuff repair (rt)  . Tonsillectomy    . Yag laser application Right 05/29/2012    Procedure: YAG LASER APPLICATION;  Surgeon: Susa Simmondsarroll F Haines, MD;  Location: [facility];  Service: Ophthalmology;  Laterality: Right;    No prescriptions prior to admission    Allergies  Allergen Reactions  . Sulfa Antibiotics Nausea And Vomiting    Social History  Substance Use Topics  . Smoking status: Never Smoker   . Smokeless tobacco: Not on file  . Alcohol Use: No    The patient says his family history is unknown he does not remember   Review of Systems  Constitutional: Negative for fever and chills.  HENT: Negative for ear pain and nosebleeds.   Eyes: Negative for photophobia.  Respiratory: Negative for cough.   Cardiovascular: Negative for chest pain.  Gastrointestinal: Negative for vomiting.  Genitourinary: Positive for urgency.  Skin: Negative for rash.  Neurological: Negative for dizziness, seizures and headaches.  Endo/Heme/Allergies: Negative for environmental allergies and polydipsia. Does not bruise/bleed easily.  Psychiatric/Behavioral: Positive for memory loss. Negative for depression. The patient does not have insomnia.     Objective:  Physical Exam  BP 152/71 mmHg  Pulse 58  Temp(Src) 97.2 F (36.2 C) (Oral)  Resp 11  Ht 5\' 11"  (1.803 m)  Wt 176 lb (79.833 kg)  BMI 24.56 kg/m2  SpO2 96%  Gen. appearance the patient has a small frame. He is oriented to person place and time. His mood is pleasant his affect is flat neutral. His gait is antalgic he favors the left leg.  The right arm is normal to inspection palpation. He has no major joint contractures. All joints are reduced and without subluxation muscle tone normal skin normal pulses normal temperature normal lymph nodes negative  axilla elbow and clavicle sensation normal no pathologic reflexes coordination with rapid movements normal  His left arm shows normal range of motion stability strength skin. No abnormalities on inspection or palpation. Skin is intact pulses are good lymph nodes are negative and sensation is normal coordination is normal there are no pathologic reflexes.  Left knee he can still bend his knee past 110 has a slight flexion contracture it stable is  tender medially there is no effusion strength is normal skin is intact there no rashes ulcerations lacerations or erythema pulse and temperature normal without edema no lymphadenopathy in the groin area. Toes are downgoing on Babinski.  Right knee shows mild arthritis as well with slight flexion contracture he can still bend past 90 slight varus knee is stable strength is normal skin is intact pulse and temperature normal no edema sensation is normal Vital signs in last 24 hours:    Labs: BMP Latest Ref Rng 08/28/2015 01/21/2011 03/31/2009  Glucose 65 - 99 mg/dL 409(W) 84 99  BUN 6 - 20 mg/dL Creatinine 0.61 - 1.24 mg/dL 1.19 1.47 8.29  Sodium 135 - 145 mmol/L 138 140 139  Potassium 3.5 - 5.1 mmol/L 4.1 4.3 3.9  Chloride 101 - 111 mmol/L 106 101 101  CO2 22 - 32 mmol/L 26 29 32  Calcium 8.9 - 10.3 mg/dL 9.0 8.9 9.1    CBC Latest Ref Rng 08/28/2015 01/21/2011 03/31/2009  WBC 4.0 - 10.5 K/uL 6.8 5.9 6.6  Hemoglobin 13.0 - 17.0 g/dL 56.2 13.0 86.5  Hematocrit 39.0 - 52.0 % 42.4 40.9 45.2  Platelets 150 - 400 K/uL 219 257 209       Estimated body mass index is 25.67 kg/(m^2) as calculated from the following:   Height as of 07/14/15:  (1.803 m).   Weight as of 07/14/15: 184 lb (83.462 kg).   Imaging Review Plain radiographs demonstrate moderate degenerative joint disease of the left knee(s). The overall alignment issignificant varus. The bone quality appears to be good for age and reported activity level.  Assessment/Plan:  End stage arthritis, left knee   The patient history, physical examination, clinical judgment of the provider and imaging studies are consistent with end stage degenerative joint disease of the left knee(s) and total knee arthroplasty is deemed medically necessary. The treatment options including medical management, injection therapy arthroscopy and arthroplasty were discussed at length. The risks and benefits of total knee arthroplasty were presented and  reviewed. The risks due to aseptic loosening, infection, stiffness, patella tracking problems, thromboembolic complications and other imponderables were discussed. The patient acknowledged the explanation, agreed to proceed with the plan and consent was signed. Patient is being admitted for inpatient treatment for surgery, pain control, PT, OT, prophylactic antibiotics, VTE prophylaxis, progressive ambulation and ADL's and discharge planning. The patient is planning to be discharged to skilled nursing facility

## 2015-09-03 ENCOUNTER — Inpatient Hospital Stay (HOSPITAL_COMMUNITY): Payer: Medicare Other | Admitting: Anesthesiology

## 2015-09-03 ENCOUNTER — Encounter (HOSPITAL_COMMUNITY): Payer: Self-pay

## 2015-09-03 ENCOUNTER — Encounter (HOSPITAL_COMMUNITY)
Admission: RE | Disposition: A | Discharge status: Skilled Nursing Facility | Payer: Self-pay | Source: Ambulatory Visit | Attending: Orthopedic Surgery

## 2015-09-03 ENCOUNTER — Inpatient Hospital Stay (HOSPITAL_COMMUNITY)
Admission: RE | Admit: 2015-09-03 | Discharge: 2015-09-05 | DRG: 470 | Disposition: A | Discharge status: Skilled Nursing Facility | Payer: Medicare Other | Source: Ambulatory Visit | Attending: Orthopedic Surgery | Admitting: Orthopedic Surgery

## 2015-09-03 ENCOUNTER — Inpatient Hospital Stay (HOSPITAL_COMMUNITY): Payer: Medicare Other

## 2015-09-03 DIAGNOSIS — I10 Essential (primary) hypertension: Secondary | ICD-10-CM | POA: Diagnosis not present

## 2015-09-03 DIAGNOSIS — M179 Osteoarthritis of knee, unspecified: Secondary | ICD-10-CM | POA: Diagnosis not present

## 2015-09-03 DIAGNOSIS — Z96652 Presence of left artificial knee joint: Secondary | ICD-10-CM | POA: Diagnosis not present

## 2015-09-03 DIAGNOSIS — M7660 Achilles tendinitis, unspecified leg: Secondary | ICD-10-CM | POA: Diagnosis not present

## 2015-09-03 DIAGNOSIS — M1990 Unspecified osteoarthritis, unspecified site: Secondary | ICD-10-CM | POA: Diagnosis not present

## 2015-09-03 DIAGNOSIS — Z978 Presence of other specified devices: Secondary | ICD-10-CM | POA: Diagnosis not present

## 2015-09-03 DIAGNOSIS — M1712 Unilateral primary osteoarthritis, left knee: Secondary | ICD-10-CM | POA: Diagnosis not present

## 2015-09-03 DIAGNOSIS — E785 Hyperlipidemia, unspecified: Secondary | ICD-10-CM | POA: Diagnosis not present

## 2015-09-03 DIAGNOSIS — Z471 Aftercare following joint replacement surgery: Secondary | ICD-10-CM | POA: Diagnosis not present

## 2015-09-03 DIAGNOSIS — Z09 Encounter for follow-up examination after completed treatment for conditions other than malignant neoplasm: Secondary | ICD-10-CM

## 2015-09-03 DIAGNOSIS — K589 Irritable bowel syndrome without diarrhea: Secondary | ICD-10-CM | POA: Diagnosis not present

## 2015-09-03 DIAGNOSIS — M25562 Pain in left knee: Secondary | ICD-10-CM | POA: Diagnosis present

## 2015-09-03 DIAGNOSIS — E7800 Pure hypercholesterolemia, unspecified: Secondary | ICD-10-CM | POA: Diagnosis not present

## 2015-09-03 DIAGNOSIS — R262 Difficulty in walking, not elsewhere classified: Secondary | ICD-10-CM | POA: Diagnosis not present

## 2015-09-03 DIAGNOSIS — R488 Other symbolic dysfunctions: Secondary | ICD-10-CM | POA: Diagnosis not present

## 2015-09-03 DIAGNOSIS — M1710 Unilateral primary osteoarthritis, unspecified knee: Secondary | ICD-10-CM | POA: Diagnosis present

## 2015-09-03 DIAGNOSIS — H3530 Unspecified macular degeneration: Secondary | ICD-10-CM | POA: Diagnosis not present

## 2015-09-03 DIAGNOSIS — Z9181 History of falling: Secondary | ICD-10-CM | POA: Diagnosis not present

## 2015-09-03 DIAGNOSIS — M6281 Muscle weakness (generalized): Secondary | ICD-10-CM | POA: Diagnosis not present

## 2015-09-03 DIAGNOSIS — M7541 Impingement syndrome of right shoulder: Secondary | ICD-10-CM | POA: Diagnosis not present

## 2015-09-03 HISTORY — PX: TOTAL KNEE ARTHROPLASTY: SHX125

## 2015-09-03 SURGERY — ARTHROPLASTY, KNEE, TOTAL
Anesthesia: Spinal | Laterality: Left

## 2015-09-03 MED ORDER — OCUVITE-LUTEIN PO CAPS
1.0000 | ORAL_CAPSULE | Freq: Every day | ORAL | Status: DC
  Administered 2015-09-03 – 2015-09-04 (×2): 1 via ORAL
  Filled 2015-09-03: qty 1

## 2015-09-03 MED ORDER — PROPOFOL 500 MG/50ML IV EMUL
INTRAVENOUS | Status: DC | PRN
  Administered 2015-09-03: 09:00:00 via INTRAVENOUS
  Administered 2015-09-03: 25 ug/kg/min via INTRAVENOUS

## 2015-09-03 MED ORDER — FENTANYL CITRATE (PF) 100 MCG/2ML IJ SOLN
INTRAMUSCULAR | Status: AC
  Filled 2015-09-03: qty 2

## 2015-09-03 MED ORDER — POLYETHYLENE GLYCOL 3350 17 G PO PACK
17.0000 g | PACK | Freq: Every day | ORAL | Status: DC
  Administered 2015-09-03 – 2015-09-05 (×2): 17 g via ORAL
  Filled 2015-09-03 (×3): qty 1

## 2015-09-03 MED ORDER — CEFAZOLIN SODIUM 1-5 GM-% IV SOLN
INTRAVENOUS | Status: AC
  Filled 2015-09-03: qty 50

## 2015-09-03 MED ORDER — CALCIUM POLYCARBOPHIL 625 MG PO TABS
1250.0000 mg | ORAL_TABLET | Freq: Two times a day (BID) | ORAL | Status: DC
  Administered 2015-09-04 – 2015-09-05 (×3): 1250 mg via ORAL
  Filled 2015-09-03 (×11): qty 2

## 2015-09-03 MED ORDER — BUPIVACAINE-EPINEPHRINE (PF) 0.5% -1:200000 IJ SOLN
INTRAMUSCULAR | Status: DC | PRN
  Administered 2015-09-03: 30 mL via PERINEURAL

## 2015-09-03 MED ORDER — FENTANYL CITRATE (PF) 100 MCG/2ML IJ SOLN
INTRAMUSCULAR | Status: DC | PRN
  Administered 2015-09-03: 25 ug via INTRATHECAL

## 2015-09-03 MED ORDER — ALUM & MAG HYDROXIDE-SIMETH 200-200-20 MG/5ML PO SUSP: 30.0000 mL | ORAL | Status: DC | PRN

## 2015-09-03 MED ORDER — SIMVASTATIN 20 MG PO TABS
40.0000 mg | ORAL_TABLET | Freq: Every day | ORAL | Status: DC
  Administered 2015-09-03 – 2015-09-04 (×2): 40 mg via ORAL
  Filled 2015-09-03 (×2): qty 2

## 2015-09-03 MED ORDER — CEFAZOLIN SODIUM 1-5 GM-% IV SOLN
1.0000 g | Freq: Four times a day (QID) | INTRAVENOUS | Status: AC
  Administered 2015-09-03 (×2): 1 g via INTRAVENOUS
  Filled 2015-09-03 (×2): qty 50

## 2015-09-03 MED ORDER — OXYCODONE HCL 5 MG PO TABS
5.0000 mg | ORAL_TABLET | ORAL | Status: DC | PRN
  Administered 2015-09-03: 10 mg via ORAL
  Administered 2015-09-03: 5 mg via ORAL
  Administered 2015-09-04: 10 mg via ORAL
  Filled 2015-09-03: qty 1
  Filled 2015-09-03: qty 2
  Filled 2015-09-03 (×2): qty 1

## 2015-09-03 MED ORDER — HYDROCODONE-ACETAMINOPHEN 5-325 MG PO TABS
1.0000 | ORAL_TABLET | ORAL | Status: DC | PRN
  Administered 2015-09-05: 1 via ORAL
  Filled 2015-09-03 (×2): qty 1

## 2015-09-03 MED ORDER — BUPIVACAINE IN DEXTROSE 0.75-8.25 % IT SOLN
INTRATHECAL | Status: DC | PRN
  Administered 2015-09-03: 15 mg via INTRATHECAL

## 2015-09-03 MED ORDER — MORPHINE SULFATE (PF) 2 MG/ML IV SOLN
1.0000 mg | INTRAVENOUS | Status: DC | PRN
  Administered 2015-09-03 (×2): 1 mg via INTRAVENOUS
  Filled 2015-09-03 (×3): qty 1

## 2015-09-03 MED ORDER — BUPIVACAINE-EPINEPHRINE (PF) 0.5% -1:200000 IJ SOLN
INTRAMUSCULAR | Status: AC
  Filled 2015-09-03: qty 90

## 2015-09-03 MED ORDER — BUPIVACAINE IN DEXTROSE 0.75-8.25 % IT SOLN
INTRATHECAL | Status: AC
  Filled 2015-09-03: qty 2

## 2015-09-03 MED ORDER — PROPOFOL 10 MG/ML IV BOLUS
INTRAVENOUS | Status: AC
  Filled 2015-09-03: qty 20

## 2015-09-03 MED ORDER — BUPIVACAINE LIPOSOME 1.3 % IJ SUSP
INTRAMUSCULAR | Status: AC
  Filled 2015-09-03: qty 20

## 2015-09-03 MED ORDER — HYDROCODONE-ACETAMINOPHEN 5-325 MG PO TABS
1.0000 | ORAL_TABLET | Freq: Once | ORAL | Status: AC
  Administered 2015-09-03: 1 via ORAL
  Filled 2015-09-03: qty 1

## 2015-09-03 MED ORDER — ACETAMINOPHEN 650 MG RE SUPP: 650.0000 mg | Freq: Four times a day (QID) | RECTAL | Status: DC | PRN

## 2015-09-03 MED ORDER — KETOROLAC TROMETHAMINE 15 MG/ML IJ SOLN
7.5000 mg | Freq: Four times a day (QID) | INTRAMUSCULAR | Status: AC
  Administered 2015-09-04 (×4): 7.5 mg via INTRAVENOUS
  Filled 2015-09-03 (×4): qty 1

## 2015-09-03 MED ORDER — EPHEDRINE SULFATE 50 MG/ML IJ SOLN
INTRAMUSCULAR | Status: DC | PRN
  Administered 2015-09-03 (×2): 5 mg via INTRAVENOUS
  Administered 2015-09-03 (×2): 10 mg via INTRAVENOUS

## 2015-09-03 MED ORDER — DEXAMETHASONE SODIUM PHOSPHATE 10 MG/ML IJ SOLN
10.0000 mg | Freq: Once | INTRAMUSCULAR | Status: AC
  Administered 2015-09-04: 10 mg via INTRAVENOUS
  Filled 2015-09-03: qty 1

## 2015-09-03 MED ORDER — METOCLOPRAMIDE HCL 10 MG PO TABS: 5.0000 mg | ORAL_TABLET | Freq: Three times a day (TID) | ORAL | Status: DC | PRN

## 2015-09-03 MED ORDER — 0.9 % SODIUM CHLORIDE (POUR BTL) OPTIME
TOPICAL | Status: DC | PRN
  Administered 2015-09-03: 1000 mL

## 2015-09-03 MED ORDER — AMLODIPINE BESYLATE 5 MG PO TABS
10.0000 mg | ORAL_TABLET | Freq: Every day | ORAL | Status: DC
  Administered 2015-09-04 – 2015-09-05 (×2): 10 mg via ORAL
  Filled 2015-09-03 (×2): qty 2

## 2015-09-03 MED ORDER — OCUVITE-LUTEIN PO CAPS
1.0000 | ORAL_CAPSULE | Freq: Two times a day (BID) | ORAL | Status: DC
  Administered 2015-09-03 – 2015-09-05 (×2): 1 via ORAL
  Filled 2015-09-03 (×10): qty 1

## 2015-09-03 MED ORDER — DOCUSATE SODIUM 100 MG PO CAPS
100.0000 mg | ORAL_CAPSULE | Freq: Two times a day (BID) | ORAL | Status: DC
  Administered 2015-09-03 – 2015-09-05 (×5): 100 mg via ORAL
  Filled 2015-09-03 (×5): qty 1

## 2015-09-03 MED ORDER — SODIUM CHLORIDE 0.9 % IJ SOLN
INTRAMUSCULAR | Status: AC
  Filled 2015-09-03: qty 40

## 2015-09-03 MED ORDER — OMEGA-3-ACID ETHYL ESTERS 1 G PO CAPS
1.0000 g | ORAL_CAPSULE | Freq: Two times a day (BID) | ORAL | Status: DC
Start: 2015-09-03 — End: 2015-09-05
  Administered 2015-09-03 – 2015-09-05 (×5): 1 g via ORAL
  Filled 2015-09-03 (×11): qty 1

## 2015-09-03 MED ORDER — PSYLLIUM 95 % PO PACK
1.0000 | PACK | Freq: Every day | ORAL | Status: DC
  Administered 2015-09-04 – 2015-09-05 (×2): 1 via ORAL
  Filled 2015-09-03 (×7): qty 1

## 2015-09-03 MED ORDER — MIDAZOLAM HCL 2 MG/2ML IJ SOLN
INTRAMUSCULAR | Status: AC
  Filled 2015-09-03: qty 2

## 2015-09-03 MED ORDER — MENTHOL 3 MG MT LOZG
1.0000 | LOZENGE | OROMUCOSAL | Status: DC | PRN
Start: 2015-09-03 — End: 2015-09-05

## 2015-09-03 MED ORDER — ACETAMINOPHEN 325 MG PO TABS
650.0000 mg | ORAL_TABLET | Freq: Four times a day (QID) | ORAL | Status: DC | PRN
  Administered 2015-09-04: 650 mg via ORAL
  Filled 2015-09-03: qty 2

## 2015-09-03 MED ORDER — PHENOL 1.4 % MT LIQD: 1.0000 | OROMUCOSAL | Status: DC | PRN

## 2015-09-03 MED ORDER — ONDANSETRON HCL 4 MG PO TABS: 4.0000 mg | ORAL_TABLET | Freq: Four times a day (QID) | ORAL | Status: DC | PRN

## 2015-09-03 MED ORDER — DIPHENHYDRAMINE HCL 12.5 MG/5ML PO ELIX: 12.5000 mg | ORAL_SOLUTION | ORAL | Status: DC | PRN

## 2015-09-03 MED ORDER — METOCLOPRAMIDE HCL 5 MG/ML IJ SOLN
5.0000 mg | Freq: Three times a day (TID) | INTRAMUSCULAR | Status: DC | PRN
Start: 2015-09-03 — End: 2015-09-05

## 2015-09-03 MED ORDER — VITAMIN C 500 MG PO TABS
500.0000 mg | ORAL_TABLET | Freq: Every day | ORAL | Status: DC
  Administered 2015-09-03 – 2015-09-05 (×3): 500 mg via ORAL
  Filled 2015-09-03 (×3): qty 1

## 2015-09-03 MED ORDER — CHLORHEXIDINE GLUCONATE 4 % EX LIQD: 60.0000 mL | Freq: Once | CUTANEOUS | Status: DC

## 2015-09-03 MED ORDER — LOSARTAN POTASSIUM 50 MG PO TABS
50.0000 mg | ORAL_TABLET | Freq: Every day | ORAL | Status: DC
  Administered 2015-09-03 – 2015-09-04 (×2): 50 mg via ORAL
  Filled 2015-09-03 (×2): qty 1

## 2015-09-03 MED ORDER — METHOCARBAMOL 500 MG PO TABS: 500.0000 mg | ORAL_TABLET | Freq: Four times a day (QID) | ORAL | Status: DC | PRN

## 2015-09-03 MED ORDER — METHOCARBAMOL 1000 MG/10ML IJ SOLN: 500.0000 mg | Freq: Four times a day (QID) | INTRAVENOUS | Status: DC | PRN

## 2015-09-03 MED ORDER — ASPIRIN EC 325 MG PO TBEC
325.0000 mg | DELAYED_RELEASE_TABLET | Freq: Every day | ORAL | Status: DC
  Administered 2015-09-04 – 2015-09-05 (×2): 325 mg via ORAL
  Filled 2015-09-03 (×2): qty 1

## 2015-09-03 MED ORDER — SODIUM CHLORIDE 0.9 % IR SOLN
Status: DC | PRN
  Administered 2015-09-03: 3000 mL

## 2015-09-03 MED ORDER — CEFAZOLIN SODIUM-DEXTROSE 2-4 GM/100ML-% IV SOLN
2.0000 g | INTRAVENOUS | Status: AC
  Administered 2015-09-03: 2 g via INTRAVENOUS
  Filled 2015-09-03: qty 100

## 2015-09-03 MED ORDER — SACCHAROMYCES BOULARDII 250 MG PO CAPS
250.0000 mg | ORAL_CAPSULE | Freq: Two times a day (BID) | ORAL | Status: DC
  Administered 2015-09-03 – 2015-09-05 (×5): 250 mg via ORAL
  Filled 2015-09-03 (×5): qty 1

## 2015-09-03 MED ORDER — FENTANYL CITRATE (PF) 100 MCG/2ML IJ SOLN: 25.0000 ug | INTRAMUSCULAR | Status: DC | PRN

## 2015-09-03 MED ORDER — LACTATED RINGERS IV SOLN
INTRAVENOUS | Status: DC
  Administered 2015-09-03: 1000 mL via INTRAVENOUS
  Administered 2015-09-03: 08:00:00 via INTRAVENOUS

## 2015-09-03 MED ORDER — PHENYLEPHRINE 40 MCG/ML (10ML) SYRINGE FOR IV PUSH (FOR BLOOD PRESSURE SUPPORT)
PREFILLED_SYRINGE | INTRAVENOUS | Status: AC
  Filled 2015-09-03: qty 10

## 2015-09-03 MED ORDER — PHILLIPS COLON HEALTH PO CAPS: ORAL_CAPSULE | Freq: Every day | ORAL | Status: DC

## 2015-09-03 MED ORDER — SODIUM CHLORIDE 0.9 % IV SOLN
INTRAVENOUS | Status: DC
  Administered 2015-09-03 – 2015-09-04 (×2): via INTRAVENOUS

## 2015-09-03 MED ORDER — ONDANSETRON HCL 4 MG/2ML IJ SOLN: 4.0000 mg | Freq: Four times a day (QID) | INTRAMUSCULAR | Status: DC | PRN

## 2015-09-03 MED ORDER — SODIUM CHLORIDE 0.9 % IV SOLN
INTRAVENOUS | Status: DC | PRN
  Administered 2015-09-03: 09:00:00

## 2015-09-03 MED ORDER — BUPIVACAINE-EPINEPHRINE (PF) 0.5% -1:200000 IJ SOLN
INTRAMUSCULAR | Status: AC
  Filled 2015-09-03: qty 30

## 2015-09-03 MED ORDER — FENTANYL CITRATE (PF) 100 MCG/2ML IJ SOLN
25.0000 ug | INTRAMUSCULAR | Status: DC
  Administered 2015-09-03: 25 ug via INTRAVENOUS

## 2015-09-03 MED ORDER — BUPIVACAINE LIPOSOME 1.3 % IJ SUSP
20.0000 mL | Freq: Once | INTRAMUSCULAR | Status: DC
  Filled 2015-09-03: qty 20

## 2015-09-03 MED ORDER — MIDAZOLAM HCL 2 MG/2ML IJ SOLN
1.0000 mg | INTRAMUSCULAR | Status: DC | PRN
  Administered 2015-09-03: 2 mg via INTRAVENOUS

## 2015-09-03 MED ORDER — ONDANSETRON HCL 4 MG/2ML IJ SOLN: 4.0000 mg | Freq: Once | INTRAMUSCULAR | Status: DC | PRN

## 2015-09-03 MED ORDER — GLYCOPYRROLATE 0.2 MG/ML IJ SOLN
INTRAMUSCULAR | Status: AC
Start: 2015-09-03 — End: 2015-09-03
  Filled 2015-09-03: qty 1

## 2015-09-03 MED ORDER — CALCIUM CARBONATE-VITAMIN D 500-200 MG-UNIT PO TABS
1.0000 | ORAL_TABLET | Freq: Two times a day (BID) | ORAL | Status: DC
  Administered 2015-09-03 – 2015-09-05 (×5): 1 via ORAL
  Filled 2015-09-03 (×11): qty 1

## 2015-09-03 SURGICAL SUPPLY — 71 items
BAG HAMPER (MISCELLANEOUS) ×3 IMPLANT
BANDAGE ESMARK 6X9 LF (GAUZE/BANDAGES/DRESSINGS) ×1 IMPLANT
BIT DRILL 3.2X128 (BIT) IMPLANT
BIT DRILL 3.2X128MM (BIT)
BLADE HEX COATED 2.75 (ELECTRODE) ×3 IMPLANT
BLADE SAGITTAL 25.0X1.27X90 (BLADE) ×3 IMPLANT
BLADE SAGITTAL 25.0X1.27X90MM (BLADE) ×2
BNDG CMPR 9X6 STRL LF SNTH (GAUZE/BANDAGES/DRESSINGS) ×1
BNDG ESMARK 6X9 LF (GAUZE/BANDAGES/DRESSINGS) ×3
BOWL SMART MIX CTS (DISPOSABLE) IMPLANT
CAP KNEE TOTAL 3 SIGMA ×2 IMPLANT
CEMENT HV SMART SET (Cement) ×5 IMPLANT
CLOTH BEACON ORANGE TIMEOUT ST (SAFETY) ×3 IMPLANT
COOLER CRYO CUFF IC AND MOTOR (MISCELLANEOUS) ×3 IMPLANT
COVER LIGHT HANDLE STERIS (MISCELLANEOUS) ×6 IMPLANT
COVER PROBE W GEL 5X96 (DRAPES) ×3 IMPLANT
CUFF CRYO KNEE LG 20X31 COOLER (ORTHOPEDIC SUPPLIES) ×1 IMPLANT
CUFF CRYO KNEE18X23 MED (MISCELLANEOUS) ×3 IMPLANT
CUFF TOURNIQUET SINGLE 34IN LL (TOURNIQUET CUFF) ×2 IMPLANT
CUFF TOURNIQUET SINGLE 44IN (TOURNIQUET CUFF) IMPLANT
DECANTER SPIKE VIAL GLASS SM (MISCELLANEOUS) ×7 IMPLANT
DRAPE BACK TABLE (DRAPES) ×3 IMPLANT
DRAPE EXTREMITY T 121X128X90 (DRAPE) ×3 IMPLANT
DRSG AQUACEL AG ADV 3.5X10 (GAUZE/BANDAGES/DRESSINGS) ×3 IMPLANT
DURAPREP 26ML APPLICATOR (WOUND CARE) ×6 IMPLANT
ELECT REM PT RETURN 9FT ADLT (ELECTROSURGICAL) ×3
ELECTRODE REM PT RTRN 9FT ADLT (ELECTROSURGICAL) ×1 IMPLANT
EVACUATOR 3/16  PVC DRAIN (DRAIN) ×2
EVACUATOR 3/16 PVC DRAIN (DRAIN) ×1 IMPLANT
GLOVE BIOGEL M 7.0 STRL (GLOVE) ×6 IMPLANT
GLOVE BIOGEL PI IND STRL 7.0 (GLOVE) ×1 IMPLANT
GLOVE BIOGEL PI INDICATOR 7.0 (GLOVE) ×8
GLOVE SKINSENSE NS SZ8.0 LF (GLOVE) ×4
GLOVE SKINSENSE STRL SZ8.0 LF (GLOVE) ×2 IMPLANT
GLOVE SS BIOGEL STRL SZ 6.5 (GLOVE) IMPLANT
GLOVE SS N UNI LF 8.5 STRL (GLOVE) ×3 IMPLANT
GLOVE SUPERSENSE BIOGEL SZ 6.5 (GLOVE) ×2
GOWN STRL REUS W/ TWL LRG LVL3 (GOWN DISPOSABLE) ×1 IMPLANT
GOWN STRL REUS W/TWL LRG LVL3 (GOWN DISPOSABLE) ×9 IMPLANT
GOWN STRL REUS W/TWL XL LVL3 (GOWN DISPOSABLE) ×3 IMPLANT
HANDPIECE INTERPULSE COAX TIP (DISPOSABLE) ×3
HOOD W/PEELAWAY (MISCELLANEOUS) ×12 IMPLANT
INST SET MAJOR BONE (KITS) ×3 IMPLANT
IV NS IRRIG 3000ML ARTHROMATIC (IV SOLUTION) ×3 IMPLANT
KIT BLADEGUARD II DBL (SET/KITS/TRAYS/PACK) ×3 IMPLANT
KIT ROOM TURNOVER APOR (KITS) ×3 IMPLANT
MANIFOLD NEPTUNE II (INSTRUMENTS) ×3 IMPLANT
MARKER SKIN DUAL TIP RULER LAB (MISCELLANEOUS) ×3 IMPLANT
NDL HYPO 21X1.5 SAFETY (NEEDLE) ×1 IMPLANT
NEEDLE HYPO 21X1.5 SAFETY (NEEDLE) ×3 IMPLANT
NS IRRIG 1000ML POUR BTL (IV SOLUTION) ×3 IMPLANT
PACK TOTAL JOINT (CUSTOM PROCEDURE TRAY) ×3 IMPLANT
PAD ARMBOARD 7.5X6 YLW CONV (MISCELLANEOUS) ×3 IMPLANT
PAD DANNIFLEX CPM (ORTHOPEDIC SUPPLIES) ×3 IMPLANT
PIN TROCAR 3 INCH (PIN) ×2 IMPLANT
SAW OSC TIP CART 19.5X105X1.3 (SAW) ×5 IMPLANT
SET BASIN LINEN APH (SET/KITS/TRAYS/PACK) ×3 IMPLANT
SET HNDPC FAN SPRY TIP SCT (DISPOSABLE) ×1 IMPLANT
STAPLER VISISTAT 35W (STAPLE) ×3 IMPLANT
SUT BRALON NAB BRD #1 30IN (SUTURE) ×6 IMPLANT
SUT MON AB 0 CT1 (SUTURE) ×3 IMPLANT
SUT MON AB 2-0 CT1 36 (SUTURE) IMPLANT
SYR 20CC LL (SYRINGE) IMPLANT
SYR 30ML LL (SYRINGE) ×3 IMPLANT
SYR BULB IRRIGATION 50ML (SYRINGE) ×3 IMPLANT
TOWEL OR 17X26 4PK STRL BLUE (TOWEL DISPOSABLE) ×3 IMPLANT
TOWER CARTRIDGE SMART MIX (DISPOSABLE) IMPLANT
TRAY FOLEY CATH SILVER 16FR (SET/KITS/TRAYS/PACK) ×3 IMPLANT
TUBE HEMOVAC SOFT LGE (MISCELLANEOUS) ×2 IMPLANT
WATER STERILE IRR 1000ML POUR (IV SOLUTION) ×6 IMPLANT
YANKAUER SUCT 12FT TUBE ARGYLE (SUCTIONS) ×3 IMPLANT

## 2015-09-03 NOTE — Interval H&P Note (Signed)
History and Physical Interval Note:  09/03/2015 7:21 AM  Christopher Warner  has presented today for surgery, with the diagnosis of left knee osteoarthritis  The various methods of treatment have been discussed with the patient and family. After consideration of risks, benefits and other options for treatment, the patient has consented to  Procedure(s): TOTAL KNEE ARTHROPLASTY (Left) as a surgical intervention .  The patient's history has been reviewed, patient examined, no change in status, stable for surgery.  I have reviewed the patient's chart and labs.  Questions were answered to the patient's satisfaction.     Darick Fetters   

## 2015-09-03 NOTE — Interval H&P Note (Signed)
History and Physical Interval Note:  09/03/2015 7:21 AM  Christopher Warner  has presented today for surgery, with the diagnosis of left knee osteoarthritis  The various methods of treatment have been discussed with the patient and family. After consideration of risks, benefits and other options for treatment, the patient has consented to  Procedure(s): TOTAL KNEE ARTHROPLASTY (Left) as a surgical intervention .  The patient's history has been reviewed, patient examined, no change in status, stable for surgery.  I have reviewed the patient's chart and labs.  Questions were answered to the patient's satisfaction.     [name]

## 2015-09-03 NOTE — Op Note (Signed)
Surgical dictation for left total knee  Preop diagnosis osteoarthritis left knee  Postop diagnosis osteoarthritis left knee  Surgeon Dr. Romeo AppleHarrison [phone] Assisted by Alto NationBETTY ASHLEY AND CATHERINE PAGE  Anesthetic spinal  Implants DEPUY  SIGMA PS FB   SIZES:    F 4   T 4   P 35  Poly 10   Drains: one Hemovac drain in the joint   Exparel 20CC   Marcaine with epinephrine    Operative findings : Severe wear of the medial compartment mild varus was noted. Lateral condylar femoral wear was noted grade 4 as well trochlea grade 3 patella grade 4 tibial plateau grade 3 medially grade 2 laterally    details of procedure:   The patient was identified in the preop holding area and the surgical site was confirmed as the left knee. Chart review and update were completed. The patient was taken to the operating room for spinal anesthesia. After successful spinal anesthesia Foley catheter was inserted. The patient was placed supine on the operating table.   the left leg was prepped with ChloraPrep and draped sterilely. Timeout was completed. The limb was then exsanguinated a  6 inch Esmarch. The tourniquet was elevated to 300 mmHg.   A midline incision was made and taken down to the extensor mechanism followed by medial arthrotomy. The patella was everted. synovectomy was performed as needed. The osteophytes were resected.  Anterior cruciate ligament and PCL and medial and lateral meniscus were resected.   a 3/8 inch drill bit was used to enter the femoral canal which was suctioned and irrigated until the fluid was clear. The distal femoral cut was set for 11 millimeter resection with a 5   Left Valgus angle. This cut was completed and checked for flatness.  the tibia was subluxated forward and the external alignment guide was placed. We removed 8 mm of bone from the higher lateral side. We set the guide for neutral varus valgus cut related to the  Mechanical axis of the tibia and for slope matching  the patient's anatomy. Rotational alignment was set using the tibial tubercle, tibial spine and second metatarsal. The cutting block was pinned and the proximal tibia was resected.    the femur was then measured to a size 4.  The cutting block was placed to match the epicondyles and the 4 distal cuts were made.   History block was placed to check extension and extension gap was tight in an additional 2 mm of bone was resected from the distal femur.  We then checked the flexion gap and a 10 mm insert balanced in both flexion and extension gap.   We placed the femoral notch cutting guide size 4  and resected the notch.   Trial implants replaced using appropriate size femur , appropriate size tibial baseplate which was measured after the proximal tibia resection. (Size 4) Tibial rotation was set patella tracking was normal   The tibia was then punched per manufacture technique making sure to avoid internal rotation.   The patella measured a size 23   We resected down to a size 15 using a size 35 x 8.5 button.   Final range of motion check was performed with the appropriate size trials as mentioned above. Satisfactory reduction and motion were obtained.   Trial implants were removed. The bone was irrigated and dried and the cement was mixed on the back table  These implants were then cemented in place. Excess cement was removed. The cement was  allowed to cure. Second irrigation was performed.    FInal range of motion check and stability check was completed  The wound was irrigated third time Hemovac drain was placed, extensor mechanism was closed with #1 Nurolon followed by 0 Monocryl and staples to reapproximate the skin edges and subcutaneous tissue.   Sterile dressing was applied  The patient was taken recovery in stable condition

## 2015-09-03 NOTE — Brief Op Note (Signed)
09/03/2015  9:29 AM  PATIENT:  Christopher Warner  82 y.o. male  PRE-OPERATIVE DIAGNOSIS:  left knee osteoarthritis  POST-OPERATIVE DIAGNOSIS:  left knee osteoarthritis  DEPUY SIGMA PS FB 66F 4T 10 POLY 35 X 8.5 P  PROCEDURE:  Procedure(s): TOTAL KNEE ARTHROPLASTY (Left)  SURGEON:  Surgeon(s) and Role:    * Vickki HearingStanley E Harrison, MD - Primary  PHYSICIAN ASSISTANT:   ASSISTANTS: BETTY ASHLEY AND CATHERINE PAGE    ANESTHESIA:   spinal  EBL:  Total I/O In: 1500 [I.V.:1500] Out: 10 [Blood:10]  BLOOD ADMINISTERED:none  DRAINS: HEMOVAC IN THE JOINT ACTIVATED   LOCAL MEDICATIONS USED:  MARCAINE   , Amount: WITH EPI 30 ml and OTHER EXPAREL 20CC  SPECIMEN:  No Specimen  DISPOSITION OF SPECIMEN:  N/A  COUNTS:  YES  TOURNIQUET:   Total Tourniquet Time Documented: Thigh (Left) - 75 minutes Total: Thigh (Left) - 75 minutes   DICTATION: .Reubin Milanragon Dictation  PLAN OF CARE: Admit to inpatient   PATIENT DISPOSITION:  PACU - hemodynamically stable.   Delay start of Pharmacological VTE agent (>24hrs) due to surgical blood loss or risk of bleeding: yes

## 2015-09-03 NOTE — Progress Notes (Signed)
Postoperative x-ray shows alignment is normal and there are no complications immediately apparent related to the surgery

## 2015-09-03 NOTE — Transfer of Care (Signed)
Immediate Anesthesia Transfer of Care Note  Patient: Christopher Warner  Procedure(s) Performed: Procedure(s): TOTAL KNEE ARTHROPLASTY (Left)  Patient Location: PACU  Anesthesia Type:Spinal  Level of Consciousness: awake and patient cooperative  Airway & Oxygen Therapy: Patient Spontanous Breathing and Patient connected to face mask oxygen  Post-op Assessment: Report given to RN and Post -op Vital signs reviewed and stable  Post vital signs: Reviewed and stable  Last Vitals:  Filed Vitals:   09/03/15 0720 09/03/15 0725  BP: 108/65 113/64  Pulse:    Temp:    Resp: 16 11    Last Pain: There were no vitals filed for this visit.    Patients Stated Pain Goal: 9 (09/03/15 [id number])  Complications: No apparent anesthesia complications

## 2015-09-03 NOTE — Evaluation (Signed)
Physical Therapy Evaluation Patient Details Name: Christopher Warner MRN: [id number] DOB: [birth year] Today's Date: 09/03/2015   History of Present Illness  82 yo M admitted for L TKA. PMH: Edema, Achilles tendinitis, rupture of rotator cuff, shoulder pain, impingement syndrome, HTN, IBS, arthritis, macular degeneration, back surgeries x 3, knee cartilage surgery, elbow surgery, R rotator cuff repair.  Clinical Impression  Pt received in bed, dtr present, and pt is agreeable to PT evaluation.  Pt states he is normally independent and lives alone at home.  Pt was able to perform bed mobility at Mod (I), transferred sit<>stand with Min guard, and ambulated 82ft with RW and min guard - limited due to dizziness with BP: 106/75, HR: 57bpm at end of tx.  Pt would benefit from SNF placement due to living alone.      Follow Up Recommendations SNF (Pt lives alone)    Equipment Recommendations  Other (comment) (TBD at next facility)    Recommendations for Other Services       Precautions / Restrictions Restrictions Weight Bearing Restrictions: Yes LLE Weight Bearing: Weight bearing as tolerated      Mobility  Bed Mobility Overal bed mobility: Modified Independent                Transfers Overall transfer level: Needs assistance Equipment used: Rolling walker (2 wheeled) Transfers: Sit to/from Stand Sit to Stand: Min guard         General transfer comment: vc's for hand placement.   Ambulation/Gait Ambulation/Gait assistance: Min guard Ambulation Distance (Feet): 40 Feet Assistive device: Rolling walker (2 wheeled) Gait Pattern/deviations: Step-to pattern;Step-through pattern;Trunk flexed     General Gait Details: Fwd head posture, required multiple cues to look ahead.  Pt also requires cues for breathing throughout entire tx session.  Distance limited due to pt c/o dizziness.   Stairs            Wheelchair Mobility    Modified Rankin (Stroke Patients Only)       Balance Overall balance assessment: Needs assistance Sitting-balance support: No upper extremity supported Sitting balance-Leahy Scale: Good     Standing balance support: Bilateral upper extremity supported Standing balance-Leahy Scale: Fair                               Pertinent Vitals/Pain Pain Assessment: 0-10 Pain Score: 10-Worst pain ever Pain Location: L knee - surgical pain Pain Intervention(s): Premedicated before session    Home Living   Living Arrangements: Alone   Type of Home: House Home Access: Stairs to enter   [employer] of Steps: 1 entry step and 1 step to the kitchen.  Home Layout: One level Home Equipment: None      Prior Function Level of Independence: Independent         Comments: Pt states that he normally works out 5days/wk at [employer].      Hand Dominance        Extremity/Trunk Assessment   Upper Extremity Assessment: Defer to OT evaluation           Lower Extremity Assessment: LLE deficits/detail;Generalized weakness   LLE Deficits / Details: L knee extension: 3/5, hip flexion: 3/5, and ankle DF: 3/5     Communication   Communication: No difficulties  Cognition Arousal/Alertness: Awake/alert Behavior During Therapy: WFL for tasks assessed/performed Overall Cognitive Status: Within Functional Limits for tasks assessed  General Comments      Exercises Total Joint Exercises Ankle Circles/Pumps: AROM;Both;10 reps;Supine Quad Sets: Strengthening;Both;10 reps;Supine Short Arc Quad: Strengthening;Left;10 reps;Supine Heel Slides: Strengthening;Left;10 reps;Supine      Assessment/Plan    PT Assessment Patient needs continued PT services  PT Diagnosis Difficulty walking;Abnormality of gait;Generalized weakness   PT Problem List Decreased strength;Decreased range of motion;Decreased activity tolerance;Decreased balance;Decreased mobility;Decreased coordination;Decreased  cognition;Decreased knowledge of use of DME;Decreased safety awareness;Decreased knowledge of precautions;Cardiopulmonary status limiting activity  PT Treatment Interventions DME instruction;Gait training;Stair training;Functional mobility training;Therapeutic activities;Therapeutic exercise;Balance training;Patient/family education   PT Goals (Current goals can be found in the Care Plan section) Acute Rehab PT Goals Patient Stated Goal: Pt wants to get back to the gym PT Goal Formulation: With patient/family Time For Goal Achievement: 09/10/15 Potential to Achieve Goals: Good    Frequency BID   Barriers to discharge Decreased caregiver support      Co-evaluation               End of Session Equipment Utilized During Treatment: Gait belt Activity Tolerance: Patient tolerated treatment well Patient left: in chair;with family/visitor present Nurse Communication: Mobility status    Functional Assessment Tool Used: [employer] AM-PAC "6-clicks'  Functional Limitation: Mobility: Walking and moving around Mobility: Walking and Moving Around Current Status [phone](G8978): At least 20 percent but less than 40 percent impaired, limited or restricted Mobility: Walking and Moving Around Goal Status [phone](G8979): At least 1 percent but less than 20 percent impaired, limited or restricted    Time: [id number] PT Time Calculation (min) (ACUTE ONLY): 46 min   Charges:   PT Evaluation $PT Eval Low Complexity: 1 Procedure PT Treatments $Gait Training: 8-22 mins $Therapeutic Exercise: 8-22 mins   PT G Codes:   PT G-Codes **NOT FOR INPATIENT CLASS** Functional Assessment Tool Used: [employer] AM-PAC "6-clicks'  Functional Limitation: Mobility: Walking and moving around Mobility: Walking and Moving Around Current Status [phone](G8978): At least 20 percent but less than 40 percent impaired, limited or restricted Mobility: Walking and Moving Around Goal Status [phone](G8979): At least 1 percent but less  than 20 percent impaired, limited or restricted    Carollee HerterBeth Cayde Held, PT, DPT X: [phone]   09/03/2015, 4:33 PM

## 2015-09-03 NOTE — Anesthesia Preprocedure Evaluation (Signed)
Anesthesia Evaluation  Patient identified by MRN, date of birth, ID band Patient awake    Reviewed: Allergy & Precautions, NPO status , Patient's Chart, lab work & pertinent test results  Airway Mallampati: II  TM Distance: >3 FB     Dental  (+) Teeth Intact, Dental Advisory Given   Pulmonary neg pulmonary ROS,    breath sounds clear to auscultation       Cardiovascular hypertension, Pt. on medications  Rhythm:Regular Rate:Normal     Neuro/Psych    GI/Hepatic negative GI ROS,   Endo/Other    Renal/GU      Musculoskeletal   Abdominal   Peds  Hematology   Anesthesia Other Findings   Reproductive/Obstetrics                             Anesthesia Physical Anesthesia Plan  ASA: II  Anesthesia Plan: Spinal   Post-op Pain Management:    Induction: Intravenous  Airway Management Planned: Simple Face Mask  Additional Equipment:   Intra-op Plan:   Post-operative Plan:   Informed Consent: I have reviewed the patients History and Physical, chart, labs and discussed the procedure including the risks, benefits and alternatives for the proposed anesthesia with the patient or authorized representative who has indicated his/her understanding and acceptance.     Plan Discussed with:   Anesthesia Plan Comments:         Anesthesia Quick Evaluation

## 2015-09-03 NOTE — Anesthesia Postprocedure Evaluation (Signed)
Anesthesia Post Note  Patient: Christopher Warner  Procedure(s) Performed: Procedure(s) (LRB): TOTAL KNEE ARTHROPLASTY (Left)  Patient location during evaluation: PACU Anesthesia Type: Spinal Level of consciousness: awake, oriented and patient cooperative Pain management: pain level controlled Vital Signs Assessment: post-procedure vital signs reviewed and stable Respiratory status: spontaneous breathing, nonlabored ventilation and respiratory function stable Cardiovascular status: blood pressure returned to baseline and stable Postop Assessment: spinal receding, no signs of nausea or vomiting and no backache Anesthetic complications: no    Last Vitals:  Filed Vitals:   09/03/15 0720 09/03/15 0725  BP: 108/65 113/64  Pulse:    Temp:    Resp: 16 11    Last Pain: There were no vitals filed for this visit.               [name]

## 2015-09-03 NOTE — Anesthesia Procedure Notes (Signed)
Spinal Patient location during procedure: OR Start time: 09/03/2015 7:44 AM Staffing Resident/CRNA: Joycelyn ManIDACAVAGE, Tian Mcmurtrey J Preanesthetic Checklist Completed: patient identified, site marked, surgical consent, pre-op evaluation, timeout performed, IV checked, risks and benefits discussed and monitors and equipment checked Spinal Block Patient position: left lateral decubitus Prep: Betadine Patient monitoring: heart rate, cardiac monitor, continuous pulse ox and blood pressure Approach: left paramedian Location: L2-3 Injection technique: single-shot Needle Needle type: Spinocan  Needle gauge: 22 G Assessment Sensory level: T6 Additional Notes CSF brisk and clear         #[id number]       31 May 18

## 2015-09-04 LAB — BASIC METABOLIC PANEL
ANION GAP: 5 (ref 5–15)
BUN: 11 mg/dL (ref 6–20)
CHLORIDE: 101 mmol/L (ref 101–111)
CO2: 29 mmol/L (ref 22–32)
Calcium: 8 mg/dL — ABNORMAL LOW (ref 8.9–10.3)
Creatinine, Ser: 0.79 mg/dL (ref 0.61–1.24)
GFR calc non Af Amer: >60 mL/min (ref >60)
GLUCOSE: 108 mg/dL — AB (ref 65–99)
POTASSIUM: 3.6 mmol/L (ref 3.5–5.1)
Sodium: 135 mmol/L (ref 135–145)

## 2015-09-04 LAB — CBC
HEMATOCRIT: 35.9 % — AB (ref 39.0–52.0)
HEMOGLOBIN: 12.3 g/dL — AB (ref 13.0–17.0)
MCH: 32.7 pg (ref 26.0–34.0)
MCHC: 34.3 g/dL (ref 30.0–36.0)
MCV: 95.5 fL (ref 78.0–100.0)
Platelets: 187 10*3/uL (ref 150–400)
RBC: 3.76 MIL/uL — AB (ref 4.22–5.81)
RDW: 13.1 % (ref 11.5–15.5)
WBC: 11.6 10*3/uL — AB (ref 4.0–10.5)

## 2015-09-04 MED ORDER — CALCIUM POLYCARBOPHIL 625 MG PO TABS
ORAL_TABLET | ORAL | Status: AC
  Filled 2015-09-04: qty 2

## 2015-09-04 NOTE — Clinical Social Work Note (Signed)
Clinical Social Work Assessment  Patient Details  Name: Christopher Warner MRN: [id number] Date of Birth: [birth year]  Date of referral:  09/04/15               Reason for consult:  Discharge Planning                Permission sought to share information with:  Family Supports Permission granted to share information::  Yes, Verbal Permission Granted  Name::     Corporate treasurer::     Relationship::  son  Contact Information:     Housing/Transportation Living arrangements for the past 2 months:  Single Family Home Source of Information:  Patient, Adult Children Patient Interpreter Needed:  None Criminal Activity/Legal Involvement Pertinent to Current Situation/Hospitalization:  No - Comment as needed Significant Relationships:  Adult Children Lives with:  Self Do you feel safe going back to the place where you live?  Yes Need for family participation in patient care:  Yes (Comment)  Care giving concerns:  Pt lives alone.   Social Worker assessment / plan:  CSW met with pt and pt's son, Dominica Severin at bedside. Pt alert and oriented and very pleasant. He states he lives alone and his children live on either side of him. Pt is very independent and still drives. He works out 5 days a week. Pt had left total knee yesterday. PT evaluated pt and recommend SNF. CSW discussed placement process. Pt is very agreeable and requests [facility] only at this time.   Employment status:  Retired Nurse, adult PT Recommendations:  [place] / Referral to community resources:  [place]  Patient/Family's Response to care:  Pt is eager to begin rehab and return home as soon as possible.   Patient/Family's Understanding of and Emotional Response to Diagnosis, Current Treatment, and Prognosis:  Pt is aware of treatment plan and recommendation for SNF. Discussed feelings regarding short term SNF and pt is very positive.   Emotional Assessment Appearance:   Appears stated age Attitude/Demeanor/Rapport:  Other (Very pleasant) Affect (typically observed):  Appropriate Orientation:  Oriented to Self, Oriented to Place, Oriented to  Time, Oriented to Situation Alcohol / Substance use:  Not Applicable Psych involvement (Current and /or in the community):  No (Comment)  Discharge Needs  Concerns to be addressed:  Discharge Planning Concerns Readmission within the last 30 days:  No Current discharge risk:  Lives alone Barriers to Discharge:  No Barriers Identified   Salome Arnt, LCSW 09/04/2015, 10:38 AM [phone]

## 2015-09-04 NOTE — Progress Notes (Signed)
Patient ID: Christopher Warner, male   DOB: [birth year], 82 y.o.   MRN: [id number]  POD # 1  Status post left total knee   VS BP 122/64 mmHg  Pulse 89  Temp(Src) 98.2 F (36.8 C) (Oral)  Resp 20  Ht 5\' 11"  (1.803 m)  Wt 176 lb (79.833 kg)  BMI 24.56 kg/m2  SpO2 91%   LABS  CBC Latest Ref Rng 09/04/2015 08/28/2015 01/21/2011  WBC 4.0 - 10.5 K/uL 11.6(H) 6.8 5.9  Hemoglobin 13.0 - 17.0 g/dL 12.3(L) 14.0 13.7  Hematocrit 39.0 - 52.0 % 35.9(L) 42.4 40.9  Platelets 150 - 400 K/uL 187 219 257    BMP Latest Ref Rng 09/04/2015 08/28/2015 01/21/2011  Glucose 65 - 99 mg/dL 409(W108(H) 119(J115(H) 84  BUN 6 - 20 mg/dL 11 20 23   Creatinine 0.61 - 1.24 mg/dL 4.780.79 2.951.00 6.211.14  Sodium 135 - 145 mmol/L 135 138 140  Potassium 3.5 - 5.1 mmol/L 3.6 4.1 4.3  Chloride 101 - 111 mmol/L 101 106 101  CO2 22 - 32 mmol/L 29 26 29   Calcium 8.9 - 10.3 mg/dL 8.0(L) 9.0 8.9      DRESSING clean   Neuro-vasculo-motor status of operative limb normal   Homans sign normal calf supple  Assessment status post total knee arthroplasty patient doing well  Plan continue physical therapy. Provide adequate pain control.  [facility]

## 2015-09-04 NOTE — Addendum Note (Signed)
Addendum  created 09/04/15 1012 by Moshe SalisburyKaren E Aarionna Germer, CRNA   Modules edited: Clinical Notes   Clinical Notes:  File: [id number]

## 2015-09-04 NOTE — Anesthesia Postprocedure Evaluation (Signed)
Anesthesia Post Note  Patient: Christopher Warner  Procedure(s) Performed: Procedure(s) (LRB): TOTAL KNEE ARTHROPLASTY (Left)  Patient location during evaluation: Nursing Unit Anesthesia Type: Spinal Level of consciousness: awake and alert and oriented Pain management: pain level controlled Vital Signs Assessment: post-procedure vital signs reviewed and stable Respiratory status: spontaneous breathing Cardiovascular status: blood pressure returned to baseline Postop Assessment: adequate PO intake Anesthetic complications: no    Last Vitals:  Filed Vitals:   09/04/15 0121 09/04/15 0950  BP: 122/64   Pulse: 65 89  Temp: 36.8 C   Resp: 20     Last Pain:  Filed Vitals:   09/04/15 0950  PainSc: 5                  Ishika Chesterfield

## 2015-09-04 NOTE — Progress Notes (Signed)
Physical Therapy Treatment Patient Details Name: Christopher Warner MRN: [id number] DOB: 1927/08/06 Today's Date: 09/04/2015    History of Present Illness 82 yo M admitted for L TKA. PMH: Edema, Achilles tendinitis, rupture of rotator cuff, shoulder pain, impingement syndrome, HTN, IBS, arthritis, macular degeneration, back surgeries x 3, knee cartilage surgery, elbow surgery, R rotator cuff repair.    PT Comments    Pt tolerating treatment session well, motivated and able to complete entire PT sesssion as planned. Pt continues to make progress toward goals as evidenced by improved pain tolerance/improved strength with HEP. Pt's greatest limitation continues to be limited extension ROM which continues to limit ability to perform gait and transfers at baseline function. Patient presenting with impairment of strength, pain, range of motion, balance, and activity tolerance, limiting ability to perform ADL and mobility tasks at  baseline level of function. Patient will benefit from skilled intervention to address the above impairments and limitations, in order to restore to prior level of function, improve patient safety upon discharge, and to decrease caregiver burden.    Follow Up Recommendations  SNF     Equipment Recommendations  Other (comment)    Recommendations for Other Services       Precautions / Restrictions Precautions Precautions: Knee;Fall Restrictions LLE Weight Bearing: Weight bearing as tolerated    Mobility  Bed Mobility Overal bed mobility: Needs Assistance Bed Mobility: Sit to Supine       Sit to supine: Supervision      Transfers Overall transfer level: Needs assistance Equipment used: Rolling walker (2 wheeled) Transfers: Sit to/from Stand Sit to Stand: Supervision         General transfer comment: in a hurry to void in BR, safety cues required. Appears stable.   Ambulation/Gait Ambulation/Gait assistance: Min guard Ambulation Distance (Feet): 30  Feet Assistive device: Rolling walker (2 wheeled)       General Gait Details: heavy BUE reliance, poor TKE ROM on LLE, limited LLE step distance.    Stairs            Wheelchair Mobility    Modified Rankin (Stroke Patients Only)       Balance Overall balance assessment: No apparent balance deficits (not formally assessed);Modified Independent                                  Cognition Arousal/Alertness: Awake/alert Behavior During Therapy: WFL for tasks assessed/performed Overall Cognitive Status: Within Functional Limits for tasks assessed                      Exercises Total Joint Exercises Straight Leg Raises: Supine;15 reps;Left;Strengthening;AROM Long Arc Quad: Left;15 reps;Seated;AROM;Strengthening Bridges: AROM;Strengthening;15 reps;Supine;Both    General Comments        Pertinent Vitals/Pain Pain Assessment: No/denies pain (no pain at rest, some pain during HEP, only minimal, does not rate. ) Pain Intervention(s): Limited activity within patient's tolerance;Monitored during session;Repositioned;Premedicated before session    Home Living                      Prior Function            PT Goals (current goals can now be found in the care plan section) Acute Rehab PT Goals Patient Stated Goal: Pt wants to get back to the gym PT Goal Formulation: With patient/family Time For Goal Achievement: 09/10/15 Potential to Achieve Goals: Good Progress towards  PT goals: Progressing toward goals    Frequency  BID    PT Plan Current plan remains appropriate    Co-evaluation             End of Session Equipment Utilized During Treatment: Gait belt Activity Tolerance: Patient tolerated treatment well;Other (comment) (limited but joint hypomobility. ) Patient left: in bed;in CPM;with bed alarm set;with call bell/phone within reach     Time: 1610-9604 PT Time Calculation (min) (ACUTE ONLY): 24 min  Charges:   $Therapeutic Exercise: 8-22 mins $Therapeutic Activity: 8-22 mins                    G Codes:      2:29 PM, 30-Sep-2015 Rosamaria Lints, PT, DPT PRN Physical Therapist - [facility] License # [id number] [phone] ([phone] (mobile)

## 2015-09-04 NOTE — NC FL2 (Signed)
[place] MEDICAID FL2 LEVEL OF CARE SCREENING TOOL     IDENTIFICATION  Patient Name: Christopher Warner Birthdate: 1927/08/31 Sex: male Admission Date (Current Location): 09/03/2015  [facility] [place]   Facility and Address:  [facility],  [address], [place] [id number]      Provider Number: [id number]  Attending Physician Name and Address:  Vickki HearingStanley E Harrison, MD  Relative Name and Phone Number:       Current Level of Care: Hospital Recommended Level of Care: Skilled Nursing Facility Prior Approval Number:    Date Approved/Denied:   PASRR Number: [id number]  Discharge Plan: SNF    Current Diagnoses: Patient Active Problem List   Diagnosis Date Noted  . Primary osteoarthritis of knee 09/03/2015  . Primary osteoarthritis of left knee   . EDEMA 02/03/2010  . ACHILLES TENDINITIS 10/02/2009  . RUPTURE ROTATOR CUFF 03/25/2009  . SHOULDER PAIN 03/06/2009  . IMPINGEMENT SYNDROME 03/06/2009    Orientation RESPIRATION BLADDER Height & Weight     Self, Time, Situation, Place  Normal Continent Weight: 176 lb (79.833 kg) Height:  5\' 11"  (180.3 cm)  BEHAVIORAL SYMPTOMS/MOOD NEUROLOGICAL BOWEL NUTRITION STATUS  Other (Comment) (n/a)  (n/a) Continent Diet (Regular)  AMBULATORY STATUS COMMUNICATION OF NEEDS Skin   Limited Assist Verbally Surgical wounds                       Personal Care Assistance Level of Assistance  Bathing, Feeding, Dressing Bathing Assistance: Limited assistance Feeding assistance: Limited assistance Dressing Assistance: Limited assistance     Functional Limitations Info  Sight, Hearing, Speech Sight Info: Impaired Hearing Info: Adequate Speech Info: Adequate    SPECIAL CARE FACTORS FREQUENCY  PT (By licensed PT)     PT Frequency: 5              Contractures Contractures Info: Not present    Additional Factors Info  Allergies   Allergies Info: Sulfa antibiotics           Current  Medications (09/04/2015):  This is the current hospital active medication list Current Facility-Administered Medications  Medication Dose Route Frequency Provider Last Rate Last Dose  . acetaminophen (TYLENOL) tablet 650 mg  650 mg Oral Q6H PRN Vickki HearingStanley E Harrison, MD       Or  . acetaminophen (TYLENOL) suppository 650 mg  650 mg Rectal Q6H PRN Vickki HearingStanley E Harrison, MD      . alum & mag hydroxide-simeth (MAALOX/MYLANTA) 200-200-20 MG/5ML suspension 30 mL  30 mL Oral Q4H PRN Vickki HearingStanley E Harrison, MD      . amLODipine (NORVASC) tablet 10 mg  10 mg Oral Daily Vickki HearingStanley E Harrison, MD   10 mg at 09/04/15 0908  . aspirin EC tablet 325 mg  325 mg Oral Q breakfast Vickki HearingStanley E Harrison, MD   325 mg at 09/04/15 0908  . calcium-vitamin D (OSCAL WITH D) 500-200 MG-UNIT per tablet 1 tablet  1 tablet Oral BID Vickki HearingStanley E Harrison, MD   1 tablet at 09/04/15 0908  . dexamethasone (DECADRON) injection 10 mg  10 mg Intravenous Once Vickki HearingStanley E Harrison, MD      . diphenhydrAMINE (BENADRYL) 12.5 MG/5ML elixir 12.5-25 mg  12.5-25 mg Oral Q4H PRN Vickki HearingStanley E Harrison, MD      . docusate sodium (COLACE) capsule 100 mg  100 mg Oral BID Vickki HearingStanley E Harrison, MD   100 mg at 09/04/15 0908  . HYDROcodone-acetaminophen (NORCO/VICODIN) 5-325 MG per tablet  1 tablet  1 tablet Oral Q4H PRN Vickki Hearing, MD      . ketorolac (TORADOL) 15 MG/ML injection 7.5 mg  7.5 mg Intravenous Q6H Vickki Hearing, MD   7.5 mg at 09/04/15 0505  . losartan (COZAAR) tablet 50 mg  50 mg Oral QHS Vickki Hearing, MD   50 mg at 09/03/15 2110  . menthol-cetylpyridinium (CEPACOL) lozenge 3 mg  1 lozenge Oral PRN Vickki Hearing, MD       Or  . phenol (CHLORASEPTIC) mouth spray 1 spray  1 spray Mouth/Throat PRN Vickki Hearing, MD      . methocarbamol (ROBAXIN) tablet 500 mg  500 mg Oral Q6H PRN Vickki Hearing, MD       Or  . methocarbamol (ROBAXIN) 500 mg in dextrose 5 % 50 mL IVPB  500 mg Intravenous Q6H PRN Vickki Hearing, MD      .  metoCLOPramide (REGLAN) tablet 5-10 mg  5-10 mg Oral Q8H PRN Vickki Hearing, MD       Or  . metoCLOPramide (REGLAN) injection 5-10 mg  5-10 mg Intravenous Q8H PRN Vickki Hearing, MD      . morphine 2 MG/ML injection 1 mg  1 mg Intravenous Q2H PRN Vickki Hearing, MD   1 mg at 09/03/15 1507  . multivitamin-lutein (OCUVITE-LUTEIN) capsule 1 capsule  1 capsule Oral BID Vickki Hearing, MD   1 capsule at 09/03/15 1252  . multivitamin-lutein (OCUVITE-LUTEIN) capsule 1 capsule  1 capsule Oral QHS Vickki Hearing, MD   1 capsule at 09/03/15 2110  . omega-3 acid ethyl esters (LOVAZA) capsule 1 g  1 g Oral BID Vickki Hearing, MD   1 g at 09/03/15 2110  . ondansetron (ZOFRAN) tablet 4 mg  4 mg Oral Q6H PRN Vickki Hearing, MD       Or  . ondansetron [facility]) injection 4 mg  4 mg Intravenous Q6H PRN Vickki Hearing, MD      . oxyCODONE (Oxy IR/ROXICODONE) immediate release tablet 5-10 mg  5-10 mg Oral Q3H PRN Vickki Hearing, MD   10 mg at 09/04/15 0907  . polycarbophil (FIBERCON) tablet 1,250 mg  1,250 mg Oral BID Vickki Hearing, MD   1,250 mg at 09/04/15 0909  . polyethylene glycol (MIRALAX / GLYCOLAX) packet 17 g  17 g Oral Daily Vickki Hearing, MD   17 g at 09/03/15 1254  . psyllium (HYDROCIL/METAMUCIL) packet 1 packet  1 packet Oral Daily Vickki Hearing, MD   1 packet at 09/04/15 [phone]  . saccharomyces boulardii (FLORASTOR) capsule 250 mg  250 mg Oral BID Vickki Hearing, MD   250 mg at 09/03/15 2110  . simvastatin (ZOCOR) tablet 40 mg  40 mg Oral QHS Vickki Hearing, MD   40 mg at 09/03/15 2109  . vitamin C (ASCORBIC ACID) tablet 500 mg  500 mg Oral Daily Vickki Hearing, MD   500 mg at 09/04/15 0908     Discharge Medications: Please see discharge summary for a list of discharge medications.  Relevant Imaging Results:  Relevant Lab Results:   Additional Information SS#: [id number]  [place], [place] [phone]

## 2015-09-04 NOTE — Care Management Note (Signed)
Case Management Note  Patient Details  Name: Christopher Warner MRN: [id number] Date of Birth: 01-25-1928  Subjective/Objective:                  S/P L knee replacement. PT has recommended SNF. Pt and family agreeable. CSW aware and is making arrangements for placement.   Action/Plan: Anticipate DC tomorrow, no CM needs.   Expected Discharge Date:      09/04/2015            Expected Discharge Plan:  Skilled Nursing Facility  In-House Referral:  Clinical Social Work  Discharge planning Services  CM Consult  Post Acute Care Choice:  NA Choice offered to:  NA  DME Arranged:    DME Agency:     HH Arranged:    HH Agency:     Status of Service:  Completed, signed off  Medicare Important Message Given:    Date Medicare IM Given:    Medicare IM give by:    Date Additional Medicare IM Given:    Additional Medicare Important Message give by:     If discussed at Long Length of Stay Meetings, dates discussed:    Additional Comments:  Malcolm MetroChildress, Nehemie Casserly Demske, RN 09/04/2015, 11:09 AM

## 2015-09-04 NOTE — Progress Notes (Signed)
Patient in bed with bedalarm on, left knee to CPM machine. Call light and personal items kept within reach. Patient found ambulating in hallway by his room with walker by nursing staff. Assisted back to room. Pt stated "I was looking for my son, I saw him in the hallway." assisted pt to bathroom, back to bed. bedalarm on for safety, call light and personal items in reach. CPM in place to left knee as ordered. Pt stated comfortable. Reoriented as needed. Safety mat placed by bed. Nursing supervisor notified of patient safety, safety sitter to assist with monitoring. Yellow nonslip socks continue to be in place. Paged MD to notify. Discussed safety plan with patient's son and daughter this afternoon. Earnstine RegalAshley Cloy Cozzens, RN

## 2015-09-04 NOTE — Evaluation (Signed)
  Occupational Therapy Evaluation Patient Details Name: Christopher Warner H Tippett MRN: [id number] DOB: 22-Sep-1927 Today's Date: 09/04/2015    History of Present Illness 82 yo M admitted for L TKA. PMH: Edema, Achilles tendinitis, rupture of rotator cuff, shoulder pain, impingement syndrome, HTN, IBS, arthritis, macular degeneration, back surgeries x 3, knee cartilage surgery, elbow surgery, R rotator cuff repair.   Clinical Impression   Pt awake, alert, oriented x4 this am, agreeable to OT evaluation. Pt requires supervision for ADL tasks this am, occasional verbal cuing for safety. Pt independent PTA, requesting SNF on discharge due to living alone. No further OT services required at this time, defer to PT for placement/HH needs.     Follow Up Recommendations  No OT follow up (defer to PT for placement)    Equipment Recommendations  None recommended by OT       Precautions / Restrictions Restrictions Weight Bearing Restrictions: Yes LLE Weight Bearing: Weight bearing as tolerated      Mobility Bed Mobility Overal bed mobility: Modified Independent                Transfers Overall transfer level: Needs assistance Equipment used: Rolling walker (2 wheeled) Transfers: Sit to/from Stand Sit to Stand: Supervision                   ADL Overall ADL's : Needs assistance/impaired Eating/Feeding: Set up   Grooming: Wash/dry hands;Oral care;Supervision/safety;Standing                   Toilet Transfer: Supervision/safety;Grab bars;RW   Toileting- Clothing Manipulation and Hygiene: Modified independent;Sit to/from stand       Functional mobility during ADLs: Supervision/safety;Rolling walker       Vision Vision Assessment?: No apparent visual deficits          Pertinent Vitals/Pain Pain Assessment: 0-10 Pain Score: 5  Pain Location: left knee Pain Descriptors / Indicators: Aching Pain Intervention(s): Limited activity within patient's  tolerance;Monitored during session     Hand Dominance Right   Extremity/Trunk Assessment Upper Extremity Assessment Upper Extremity Assessment: Overall WFL for tasks assessed   Lower Extremity Assessment Lower Extremity Assessment: Defer to PT evaluation       Communication Communication Communication: No difficulties   Cognition Arousal/Alertness: Awake/alert Behavior During Therapy: WFL for tasks assessed/performed Overall Cognitive Status: Within Functional Limits for tasks assessed                                Home Living Family/patient expects to be discharged to:: Skilled nursing facility Living Arrangements: Alone                           Home Equipment: Walker - 2 wheels;Cane - single point;Shower seat          Prior Functioning/Environment Level of Independence: Independent             OT Diagnosis: Acute pain   OT Problem List: Pain    End of Session Equipment Utilized During Treatment: Gait belt;Rolling walker  Activity Tolerance: Patient tolerated treatment well Patient left: in chair;with call bell/phone within reach;with family/visitor present   Time: [id number] OT Time Calculation (min): 22 min Charges:  OT General Charges $OT Visit: 1 Procedure OT Evaluation $OT Eval Low Complexity: 1 Procedure  Ezra SitesLeslie Derian Pfost, OTR/L  [phone]  09/04/2015, 9:02 AM

## 2015-09-04 NOTE — Progress Notes (Signed)
Nurse tech found patient attempted to ambulate with walker in room this morning, hemovac pulled out. Site clean, dressing applied. 200 ml sanguinous drainage emptied. hemovac tubing inspected. Notified Dr. Romeo AppleHarrison. Stated okay. Patient educated to call for assistance and not attempt getting up on his own for safety. Bed alarm and chair alarm in use. Call light kept within reach.  Earnstine RegalAshley Zhane Donlan, RN

## 2015-09-04 NOTE — Clinical Social Work Placement (Signed)
   CLINICAL SOCIAL WORK PLACEMENT  NOTE  Date:  09/04/2015  Patient Details  Name: Lynnell CatalanVester H Bores MRN: [id number] Date of Birth: 1927/04/10  Clinical Social Work is seeking post-discharge placement for this patient at the Skilled  Nursing Facility level of care (*CSW will initial, date and re-position this form in  chart as items are completed):  Yes   Patient/family provided with [facility] Clinical Social Work Department's list of facilities offering this level of care within the geographic area requested by the patient (or if unable, by the patient's family).  Yes   Patient/family informed of their freedom to choose among providers that offer the needed level of care, that participate in Medicare, Medicaid or managed care program needed by the patient, have an available bed and are willing to accept the patient.  Yes   Patient/family informed of [facility]'s ownership interest in [facility] and [facility], as well as of the fact that they are under no obligation to receive care at these facilities.  PASRR submitted to EDS on 09/04/15     PASRR number received on 09/04/15     Existing PASRR number confirmed on       FL2 transmitted to all facilities in geographic area requested by pt/family on 09/04/15     FL2 transmitted to all facilities within larger geographic area on       Patient informed that his/her managed care company has contracts with or will negotiate with certain facilities, including the following:            Patient/family informed of bed offers received.  Patient chooses bed at       Physician recommends and patient chooses bed at      Patient to be transferred to   on  .  Patient to be transferred to facility by       Patient family notified on   of transfer.  Name of family member notified:        PHYSICIAN       Additional Comment:    _______________________________________________ Karn CassisStultz, Raheem Kolbe Shanaberger, LCSW 09/04/2015, 10:30  AM [phone]

## 2015-09-04 NOTE — Progress Notes (Signed)
Physical Therapy Treatment Patient Details Name: Christopher Warner [name] MRN: [id number] DOB: 1928-01-02 Today's Date: 09/04/2015    History of Present Illness 82 yo M admitted for L TKA. PMH: Edema, Achilles tendinitis, rupture of rotator cuff, shoulder pain, impingement syndrome, HTN, IBS, arthritis, macular degeneration, back surgeries x 3, knee cartilage surgery, elbow surgery, R rotator cuff repair.    PT Comments    Pt tolerating treatment session well, motivated and able to complete entire PT sesssion as planned. Pt received up in cair having laready performs QS and AP independently. Pt continues to make progress toward goals as evidenced by improved tolerance to therex/gait training. Pt's greatest limitation continues to be joint hypomobility which continues to limit ability to perform ambulation at baseline function. Patient presenting with impairment of strength, pain, range of motion, balance, and activity tolerance, limiting ability to perform ADL and mobility tasks at  baseline level of function. Patient will benefit from skilled intervention to address the above impairments and limitations, in order to restore to prior level of function, improve patient safety upon discharge, and to decrease caregiver burden.    Follow Up Recommendations  SNF     Equipment Recommendations  Other (comment)    Recommendations for Other Services       Precautions / Restrictions Precautions Precautions: Knee;Fall Restrictions Weight Bearing Restrictions: Yes LLE Weight Bearing: Weight bearing as tolerated    Mobility  Bed Mobility Overal bed mobility: Modified Independent             General bed mobility comments: Received in chair (via OT)   Transfers Overall transfer level: Needs assistance Equipment used: Rolling walker (2 wheeled) Transfers: Sit to/from Stand Sit to Stand: Supervision         General transfer comment: VC for safety and technique, performed 5x for practice.    Ambulation/Gait Ambulation/Gait assistance: Min guard Ambulation Distance (Feet): 55 Feet Assistive device: Rolling walker (2 wheeled)       General Gait Details: heavy BUE reliance, poor TKE ROM on LLE, limited LLE step distance.    Stairs            Wheelchair Mobility    Modified Rankin (Stroke Patients Only)       Balance Overall balance assessment: No apparent balance deficits (not formally assessed);Modified Independent                                  Cognition Arousal/Alertness: Awake/alert Behavior During Therapy: WFL for tasks assessed/performed Overall Cognitive Status: Within Functional Limits for tasks assessed                      Exercises Total Joint Exercises Ankle Circles/Pumps: AROM;Both;Supine;20 reps (performed prior to entry) Quad Sets: Strengthening;10 reps;Standing;Left;AROM (Left TKE in stance x3sec hold) Heel Slides: Left;10 reps;AAROM;Seated Hip ABduction/ADduction: AAROM;Left;10 reps;Seated Straight Leg Raises: AAROM;Left;10 reps;Seated (Strength is excellent, min assist to perform. ) Long Arc Quad: AAROM;Left;10 reps;Seated Goniometric ROM: 14-85 degrees left knee flexion.     General Comments        Pertinent Vitals/Pain Pain Assessment: 0-10 Pain Score: 4  Pain Location: Left knee  Pain Descriptors / Indicators: Operative site guarding Pain Intervention(s): Limited activity within patient's tolerance;Monitored during session;Premedicated before session;Patient requesting pain meds-RN notified;Repositioned;Ice applied    Home Living Family/patient expects to be discharged to:: Skilled nursing facility Living Arrangements: Alone  Home Equipment: Walker - 2 wheels;Cane - single point;Shower seat      Prior Function Level of Independence: Independent          PT Goals (current goals can now be found in the care plan section) Acute Rehab PT Goals Patient Stated Goal: Pt wants to get  back to the gym PT Goal Formulation: With patient/family Time For Goal Achievement: 09/10/15 Potential to Achieve Goals: Good Progress towards PT goals: Progressing toward goals    Frequency  BID    PT Plan Current plan remains appropriate    Co-evaluation             End of Session Equipment Utilized During Treatment: Gait belt Activity Tolerance: Patient tolerated treatment well;Patient limited by pain Patient left: in chair;with family/visitor present;with call bell/phone within reach     Time: 0915-0947 PT Time Calculation (min) (ACUTE ONLY): 32 min  Charges:  $Gait Training: 8-22 mins $Therapeutic Exercise: 8-22 mins                    G Codes:     9:58 AM, 17-Sep-2015 Rosamaria Lints, PT, DPT PRN Physical Therapist - [facility] License # [id number] [phone] (mobile)

## 2015-09-04 NOTE — Clinical Social Work Placement (Signed)
   CLINICAL SOCIAL WORK PLACEMENT  NOTE  Date:  09/04/2015  Patient Details  Name: Christopher Warner MRN: [id number] Date of Birth: 09-Jul-1927  Clinical Social Work is seeking post-discharge placement for this patient at the Skilled  Nursing Facility level of care (*CSW will initial, date and re-position this form in  chart as items are completed):  Yes   Patient/family provided with [facility] Clinical Social Work Department's list of facilities offering this level of care within the geographic area requested by the patient (or if unable, by the patient's family).  Yes   Patient/family informed of their freedom to choose among providers that offer the needed level of care, that participate in Medicare, Medicaid or managed care program needed by the patient, have an available bed and are willing to accept the patient.  Yes   Patient/family informed of [facility]'s ownership interest in [facility] and [facility], as well as of the fact that they are under no obligation to receive care at these facilities.  PASRR submitted to EDS on 09/04/15     PASRR number received on 09/04/15     Existing PASRR number confirmed on       FL2 transmitted to all facilities in geographic area requested by pt/family on 09/04/15     FL2 transmitted to all facilities within larger geographic area on       Patient informed that his/her managed care company has contracts with or will negotiate with certain facilities, including the following:        Yes   Patient/family informed of bed offers received.  Patient [facility] bed [facility]     Physician recommends and patient chooses bed at      Patient to be transferred to [facility] on  .  Patient to be transferred to facility by       Patient family notified on   of transfer.  Name of family member notified:        PHYSICIAN       Additional Comment:    _______________________________________________ Karn CassisStultz, Latroya Ng  Shanaberger, LCSW 09/04/2015, 11:24 AM [phone]

## 2015-09-05 ENCOUNTER — Other Ambulatory Visit: Payer: Self-pay

## 2015-09-05 ENCOUNTER — Encounter (HOSPITAL_COMMUNITY): Payer: Self-pay

## 2015-09-05 ENCOUNTER — Inpatient Hospital Stay
Admission: RE | Admit: 2015-09-05 | Discharge: 2015-09-25 | Disposition: A | Discharge status: Home / Self Care | Payer: Medicare Other | Source: Ambulatory Visit | Attending: Internal Medicine | Admitting: Internal Medicine

## 2015-09-05 DIAGNOSIS — M6281 Muscle weakness (generalized): Secondary | ICD-10-CM | POA: Diagnosis not present

## 2015-09-05 DIAGNOSIS — Z961 Presence of intraocular lens: Secondary | ICD-10-CM | POA: Diagnosis not present

## 2015-09-05 DIAGNOSIS — K589 Irritable bowel syndrome without diarrhea: Secondary | ICD-10-CM | POA: Diagnosis not present

## 2015-09-05 DIAGNOSIS — M7660 Achilles tendinitis, unspecified leg: Secondary | ICD-10-CM | POA: Diagnosis not present

## 2015-09-05 DIAGNOSIS — M1712 Unilateral primary osteoarthritis, left knee: Secondary | ICD-10-CM | POA: Diagnosis not present

## 2015-09-05 DIAGNOSIS — E785 Hyperlipidemia, unspecified: Secondary | ICD-10-CM | POA: Diagnosis not present

## 2015-09-05 DIAGNOSIS — M1990 Unspecified osteoarthritis, unspecified site: Secondary | ICD-10-CM | POA: Diagnosis not present

## 2015-09-05 DIAGNOSIS — I10 Essential (primary) hypertension: Secondary | ICD-10-CM | POA: Diagnosis not present

## 2015-09-05 DIAGNOSIS — Z9181 History of falling: Secondary | ICD-10-CM | POA: Diagnosis not present

## 2015-09-05 DIAGNOSIS — Z96652 Presence of left artificial knee joint: Secondary | ICD-10-CM | POA: Diagnosis not present

## 2015-09-05 DIAGNOSIS — Z471 Aftercare following joint replacement surgery: Secondary | ICD-10-CM | POA: Diagnosis not present

## 2015-09-05 DIAGNOSIS — R488 Other symbolic dysfunctions: Secondary | ICD-10-CM | POA: Diagnosis not present

## 2015-09-05 DIAGNOSIS — H538 Other visual disturbances: Secondary | ICD-10-CM | POA: Diagnosis not present

## 2015-09-05 DIAGNOSIS — Z978 Presence of other specified devices: Secondary | ICD-10-CM | POA: Diagnosis not present

## 2015-09-05 DIAGNOSIS — H3530 Unspecified macular degeneration: Secondary | ICD-10-CM | POA: Diagnosis not present

## 2015-09-05 DIAGNOSIS — M7541 Impingement syndrome of right shoulder: Secondary | ICD-10-CM | POA: Diagnosis not present

## 2015-09-05 DIAGNOSIS — R262 Difficulty in walking, not elsewhere classified: Secondary | ICD-10-CM | POA: Diagnosis not present

## 2015-09-05 LAB — CBC
HEMATOCRIT: 33.6 % — AB (ref 39.0–52.0)
Hemoglobin: 12 g/dL — ABNORMAL LOW (ref 13.0–17.0)
MCH: 34 pg (ref 26.0–34.0)
MCHC: 35.7 g/dL (ref 30.0–36.0)
MCV: 95.2 fL (ref 78.0–100.0)
PLATELETS: 177 10*3/uL (ref 150–400)
RBC: 3.53 MIL/uL — ABNORMAL LOW (ref 4.22–5.81)
RDW: 13.3 % (ref 11.5–15.5)
WBC: 15.7 10*3/uL — AB (ref 4.0–10.5)

## 2015-09-05 MED ORDER — HYDROCODONE-ACETAMINOPHEN 5-325 MG PO TABS: 1.0000 | ORAL_TABLET | Freq: Four times a day (QID) | ORAL | Status: DC | PRN

## 2015-09-05 MED ORDER — ASPIRIN 325 MG PO TBEC: 325.0000 mg | DELAYED_RELEASE_TABLET | Freq: Every day | ORAL | Status: DC

## 2015-09-05 NOTE — Progress Notes (Signed)
Gave report to Landscape architectYolanda RN at [facility].  Paperwork in place.  Vitals stable.  Pt denies pain.  Transported via wheelchair.  Dressing on LLE CDI.  Sensation, movement, and circulation intact.  Pt has all belongings.  No further questions or concerns at this time.  Barrie LymeVance, Saron Tweed E RN [phone] 09/05/2015

## 2015-09-05 NOTE — Clinical Social Work Placement (Signed)
   CLINICAL SOCIAL WORK PLACEMENT  NOTE  Date:  09/05/2015  Patient Details  Name: Christopher Warner MRN: [id number] Date of Birth: November 10, 1927  Clinical Social Work is seeking post-discharge placement for this patient at the Skilled  Nursing Facility level of care (*CSW will initial, date and re-position this form in  chart as items are completed):  Yes   Patient/family provided with [facility] Clinical Social Work Department's list of facilities offering this level of care within the geographic area requested by the patient (or if unable, by the patient's family).  Yes   Patient/family informed of their freedom to choose among providers that offer the needed level of care, that participate in Medicare, Medicaid or managed care program needed by the patient, have an available bed and are willing to accept the patient.  Yes   Patient/family informed of [facility]'s ownership interest in [facility] and [facility], as well as of the fact that they are under no obligation to receive care at these facilities.  PASRR submitted to EDS on 09/04/15     PASRR number received on 09/04/15     Existing PASRR number confirmed on       FL2 transmitted to all facilities in geographic area requested by pt/family on 09/04/15     FL2 transmitted to all facilities within larger geographic area on       Patient informed that his/her managed care company has contracts with or will negotiate with certain facilities, including the following:        Yes   Patient/family informed of bed offers received.  [facility]     Physician recommends and patient chooses bed at      [facility] on 09/05/15.  Patient to be transferred to facility by staff     Patient family notified on 09/05/15 of transfer.  Name of family member notified:  [name]- daughter     PHYSICIAN       Additional Comment:     _______________________________________________ Karn CassisStultz, Leon Goodnow Shanaberger, LCSW 09/05/2015, 11:06 AM [phone]

## 2015-09-05 NOTE — Care Management Important Message (Signed)
Important Message  Patient Details  Name: Christopher Warner MRN: [id number] Date of Birth: 16-Nov-1927   Medicare Important Message Given:  Yes    Malcolm MetroChildress, Jayvan Mcshan Demske, RN 09/05/2015, 10:33 AM

## 2015-09-05 NOTE — Progress Notes (Signed)
Nurse tech asked nurse to come into patients room due to patient becoming combative. Nurse went into room, spoke to patient. Patient calmed down with nurse talking to him. Nurse explained importance of patient staying in bed due to having knee surgery. Patient explains he wants to go outside. Nurse reoriented patient to make him aware of being in the hospital. Patient reports knowing that he is in the hospital. Patient laid back in bed from a sitting position. Patient refused nurses offer to cover him with a blanket or sheet. Patient refused for nurse to turn overhead light off. Patient is currently lying in bed with safety sitter at bedside.

## 2015-09-05 NOTE — Discharge Summary (Signed)
Physician Discharge Summary  Patient ID: Christopher Warner MRN: [id number] DOB/AGE: 1928-01-13 82 y.o.  Admit date: 09/03/2015 Discharge date: 09/05/2015  Admission Diagnoses: OA LEFT KEE   Discharge Diagnoses: OA LEFT KNEE   Active Problems:   Primary osteoarthritis of left knee   Primary osteoarthritis of knee   Discharged Condition: good  Hospital Course:  6.7.17 HD 1: SURGERY LEFT TKA , SPINAL , NO COMPLICATIONS 6.8.17 HD2 STARTED PT, MILD CONFUSION WALKED WELL 6.9.17 HD3 STABLE , CLEAN DRY DRESSING, SOFT NON-TENDER CALF   Discharge Exam: Blood pressure 139/96, pulse 80, temperature 98.4 F (36.9 C), temperature source Oral, resp. rate 20, height  (1.803 m), weight 176 lb (79.833 kg), SpO2 99 %.   Disposition: [facility] SNF  Discharge Instructions    CPM    Complete by:  As directed   Continuous passive motion machine (CPM):      Use the CPM from 0 to 75 for 8 hours per day.      You may increase by 10 per day.  You may break it up into 2 or 3 sessions per day.      Use CPM for 2 weeks or until you are told to stop.     Call MD / Call 911    Complete by:  As directed   If you experience chest pain or shortness of breath, CALL 911 and be transported to the hospital emergency room.  If you develope a fever above 101 F, pus (white drainage) or increased drainage or redness at the wound, or calf pain, call your surgeon's office.     Change dressing    Complete by:  As directed   Change dressing on 6.14.2017, then change the dressing daily with sterile 4 x 4 inch gauze dressing and apply TED hose.  You may clean the incision with alcohol prior to redressing.     Constipation Prevention    Complete by:  As directed   Drink plenty of fluids.  Prune juice may be helpful.  You may use a stool softener, such as Colace (over the counter) 100 mg twice a day.  Use MiraLax (over the counter) for constipation as needed.     Diet - low sodium heart healthy    Complete by:  As directed       Do not put a pillow under the knee. Place it under the heel.    Complete by:  As directed      Increase activity slowly as tolerated    Complete by:  As directed      TED hose    Complete by:  As directed   Use stockings (TED hose) for 2 weeks on BOTH leg(s).  You may remove them at night for sleeping.            Medication List    STOP taking these medications        aspirin 81 MG tablet  Replaced by:  aspirin 325 MG EC tablet      TAKE these medications        amLODipine 10 MG tablet  Commonly known as:  NORVASC  Take 10 mg by mouth daily.     aspirin 325 MG EC tablet  Take 1 tablet (325 mg total) by mouth daily with breakfast.     BLACK CHERRY CONCENTRATE PO  Take 1 capsule by mouth daily.     CALCIUM 600 + D 600-200 MG-UNIT Tabs  Generic drug:  Calcium Carb-Cholecalciferol  Take 1 tablet by mouth 2 (two) times daily. Reported on 06/09/2015     fish oil-omega-3 fatty acids 1000 MG capsule  Take 1 g by mouth 2 (two) times daily. Reported on 06/09/2015     HYDROcodone-acetaminophen 5-325 MG tablet  Commonly known as:  NORCO/VICODIN  Take 1 tablet by mouth every 6 (six) hours as needed for moderate pain.     losartan 50 MG tablet  Commonly known as:  COZAAR  Take 50 mg by mouth at bedtime.     DIGESTIVE ADVANTAGE Caps  Take 1 capsule by mouth daily.     PHILLIPS COLON HEALTH Caps  Take by mouth at bedtime. Reported on 06/09/2015     polycarbophil 625 MG tablet  Commonly known as:  FIBERCON  Take 1,250 mg by mouth 2 (two) times daily. Reported on 06/09/2015     PRESERVISION AREDS 2 PO  Take 1 tablet by mouth 2 (two) times daily. Reported on 06/09/2015     multivitamin-lutein Caps capsule  Take 1 capsule by mouth at bedtime. Reported on 06/09/2015     psyllium 58.6 % powder  Commonly known as:  METAMUCIL  Take 1 packet by mouth 2 (two) times daily. Reported on 06/09/2015     simvastatin 40 MG tablet  Commonly known as:  ZOCOR  Take 40 mg by mouth at  bedtime. Reported on 06/09/2015     vitamin C 500 MG tablet  Commonly known as:  ASCORBIC ACID  Take 500 mg by mouth daily.           Follow-up Information    Follow up with [name], MD.   Specialties:  Orthopedic Surgery, Radiology   Contact information:   [address] [place] [address] [phone]       Signed: [name] 09/05/2015, 9:29 AM   \

## 2015-09-05 NOTE — Care Management Note (Signed)
Case Management Note  Patient Details  Name: Lynnell CatalanVester H Longmore MRN: [id number] Date of Birth: 01-16-1928  Expected Discharge Date:      09/05/2015            Expected Discharge Plan:  Skilled Nursing Facility  In-House Referral:  Clinical Social Work  Discharge planning Services  CM Consult  Post Acute Care Choice:  NA Choice offered to:  NA  DME Arranged:    DME Agency:     HH Arranged:    HH Agency:     Status of Service:  Completed, signed off  Medicare Important Message Given:  Yes Date Medicare IM Given:    Medicare IM give by:    Date Additional Medicare IM Given:    Additional Medicare Important Message give by:     If discussed at Long Length of Stay Meetings, dates discussed:    Additional Comments: Pt discharging to SNF today. No CM needs.  Malcolm Metrohildress, Mataio Mele Demske, RN 09/05/2015, 10:36 AM

## 2015-09-05 NOTE — Telephone Encounter (Signed)
RX faxed to [facility] @ [phone]. Phone number [phone]  

## 2015-09-05 NOTE — Progress Notes (Signed)
Patient ID: Christopher Warner, male   DOB: 1927-05-06, 82 y.o.   MRN: [id number]  POD # 2  Status post LEFT total knee   VS BP 139/96 mmHg  Pulse 80  Temp(Src) 98.4 F (36.9 C) (Oral)  Resp 20  Ht 5\' 11"  (1.803 m)  Wt 176 lb (79.833 kg)  BMI 24.56 kg/m2  SpO2 99%   LABS  CBC Latest Ref Rng 09/05/2015 09/04/2015 08/28/2015  WBC 4.0 - 10.5 K/uL 15.7(H) 11.6(H) 6.8  Hemoglobin 13.0 - 17.0 g/dL 12.0(L) 12.3(L) 14.0  Hematocrit 39.0 - 52.0 % 33.6(L) 35.9(L) 42.4  Platelets 150 - 400 K/uL 177 187 219    BMP Latest Ref Rng 09/04/2015 08/28/2015 01/21/2011  Glucose 65 - 99 mg/dL 956(O108(H) 130(Q115(H) 84  BUN 6 - 20 mg/dL 11 20 23   Creatinine 0.61 - 1.24 mg/dL 6.570.79 8.461.00 9.621.14  Sodium 135 - 145 mmol/L 135 138 140  Potassium 3.5 - 5.1 mmol/L 3.6 4.1 4.3  Chloride 101 - 111 mmol/L 101 106 101  CO2 22 - 32 mmol/L 29 26 29   Calcium 8.9 - 10.3 mg/dL 8.0(L) 9.0 8.9      DRESSING DRY  Neuro-vasculo-motor status of operative limb NORMAL   Homans sign normal calf supple  Assessment status post total knee arthroplasty patient doing well  Plan continue physical therapy. Provide adequate pain control.   READY FOR DISCHARGE

## 2015-09-05 NOTE — Progress Notes (Signed)
Physical Therapy Treatment Patient Details Name: Christopher Warner MRN: [id number] DOB: 17-Jun-1927 Today's Date: 09/05/2015    History of Present Illness 82 yo M admitted for L TKA. PMH: Edema, Achilles tendinitis, rupture of rotator cuff, shoulder pain, impingement syndrome, HTN, IBS, arthritis, macular degeneration, back surgeries x 3, knee cartilage surgery, elbow surgery, R rotator cuff repair.    PT Comments    Pt making good progress toward goals. Strength remains effectively intact, able to perform most HEP without physical assistance, and pain remains well managed, mostly a limiting factor during flexion ROM. The patient remains most limited by tightness in the joint which is creating restrictions in TKE moreso than flexion. Pt received in supine with foot unsupported and knee bent to comfort. Eduction provided on the nesessity to maintain heel propped throughout the day for extension stretch.   Follow Up Recommendations  SNF     Equipment Recommendations  Other (comment)    Recommendations for Other Services       Precautions / Restrictions Precautions Precautions: Knee;Fall Restrictions LLE Weight Bearing: Weight bearing as tolerated    Mobility  Bed Mobility Overal bed mobility: Needs Assistance Bed Mobility: Sit to Supine;Supine to Sit     Supine to sit: Modified independent (Device/Increase time) Sit to supine: Modified independent (Device/Increase time)   General bed mobility comments: Taught a leg hook for sit to supine.   Transfers Overall transfer level: Needs assistance Equipment used: Rolling walker (2 wheeled) Transfers: Sit to/from Stand Sit to Stand: Supervision         General transfer comment: g9ood hand placement, takes time, good safety awareness.   Ambulation/Gait Ambulation/Gait assistance: Supervision Ambulation Distance (Feet): 75 Feet Assistive device: Rolling walker (2 wheeled)   Gait velocity: 0.75m/s Gait velocity  interpretation: <1.8 ft/sec, indicative of risk for recurrent falls General Gait Details: heavy BUE reliance, poor TKE ROM on LLE, limited LLE step distance, similar to yesterday but faster.     Stairs            Wheelchair Mobility    Modified Rankin (Stroke Patients Only)       Balance Overall balance assessment: Modified Independent;No apparent balance deficits (not formally assessed)                                  Cognition Arousal/Alertness: Awake/alert Behavior During Therapy: WFL for tasks assessed/performed Overall Cognitive Status: Within Functional Limits for tasks assessed                      Exercises Total Joint Exercises Quad Sets: Strengthening;10 reps;Standing;Left;AROM Short Arc Quad: Strengthening;Left;10 reps;Supine Heel Slides: Left;10 reps;AAROM;Seated Hip ABduction/ADduction: AAROM;Left;10 reps;Seated Straight Leg Raises: Supine;15 reps;Left;Strengthening;AROM Goniometric ROM: 10-88 degrees left knee flexion.  Other Exercises Other Exercises: Narrow stance balance x30 seconds Other Exercises: Narrow stance balance c alrernate headturns over shoulder x10 bilat, minA for balance recovery.     General Comments        Pertinent Vitals/Pain Pain Assessment: 0-10 Pain Score: 4  Pain Location: left knee Pain Descriptors / Indicators: Operative site guarding Pain Intervention(s): Limited activity within patient's tolerance;Monitored during session;Premedicated before session;Repositioned;Patient requesting pain meds-RN notified;RN gave pain meds during session;Ice applied    Home Living                      Prior Function  PT Goals (current goals can now be found in the care plan section) Acute Rehab PT Goals Patient Stated Goal: Pt wants to get back to the gym PT Goal Formulation: With patient/family Time For Goal Achievement: 09/10/15 Potential to Achieve Goals: Good Progress towards PT goals:  Progressing toward goals    Frequency  BID    PT Plan Current plan remains appropriate    Co-evaluation             End of Session Equipment Utilized During Treatment: Gait belt Activity Tolerance: Patient tolerated treatment well;Other (comment) Patient left: in bed;in CPM;with bed alarm set;with call bell/phone within reach     Time: 1010-1043 PT Time Calculation (min) (ACUTE ONLY): 33 min  Charges:  $Gait Training: 8-22 mins $Therapeutic Exercise: 8-22 mins                    G Codes:      10:56 AM, 09/05/2015 Rosamaria LintsAllan C Milica Gully, PT, DPT PRN Physical Therapist - [facility] License # [id number] [phone] ([phone])  [phone] (mobile)

## 2015-09-06 LAB — TYPE AND SCREEN
ABO/RH(D): O POS
ANTIBODY SCREEN: NEGATIVE
UNIT DIVISION: 0
UNIT DIVISION: 0

## 2015-09-08 ENCOUNTER — Non-Acute Institutional Stay (SKILLED_NURSING_FACILITY): Payer: Medicare Other | Admitting: Internal Medicine

## 2015-09-08 ENCOUNTER — Encounter: Payer: Self-pay | Admitting: Internal Medicine

## 2015-09-08 DIAGNOSIS — K589 Irritable bowel syndrome without diarrhea: Secondary | ICD-10-CM | POA: Insufficient documentation

## 2015-09-08 DIAGNOSIS — H3530 Unspecified macular degeneration: Secondary | ICD-10-CM | POA: Insufficient documentation

## 2015-09-08 DIAGNOSIS — I10 Essential (primary) hypertension: Secondary | ICD-10-CM | POA: Diagnosis not present

## 2015-09-08 DIAGNOSIS — M1712 Unilateral primary osteoarthritis, left knee: Secondary | ICD-10-CM | POA: Diagnosis not present

## 2015-09-08 DIAGNOSIS — E785 Hyperlipidemia, unspecified: Secondary | ICD-10-CM | POA: Insufficient documentation

## 2015-09-08 NOTE — Assessment & Plan Note (Signed)
Probiotic daily if he is having loose stool

## 2015-09-08 NOTE — Progress Notes (Signed)
   This is a nursing facility admission history and physical. PCP: Dr Catalina PizzaZach Hall PMH ,Family and social history verified and updated.  HPI: The patient was hospitalized 6/7-09/05/15 with end-stage osteoarthritis of the left knee for which a left TKA was performed 09/03/15 under spinal anesthesia. There were no peri operative complications. Postoperatively his hemoglobin was 12 and crit 33.6. The end stage arthritis had greatly compromised his activities of daily living and was associated with pain for which he would take Aleve as needed. Apparently he's also had steroid injections into the knee on several occasions in the past. He has history of hypertension: Renal function was normal 09/04/15.  Comprehensive review of systems: He has occasional loose stools related to his irritable bowel syndrome. He states that his wife told him that he snored in the past. Blood pressures have been well controlled at home with values less than 140/90. Constitutional: No fever,significant weight change, fatigue  Eyes: No redness, discharge, pain, vision change ENT/mouth: No nasal congestion,  purulent discharge, earache,change in hearing ,sore throat  Cardiovascular: No chest pain, palpitations,paroxysmal nocturnal dyspnea, claudication, edema  Respiratory: No cough, sputum production,hemoptysis, DOE , apnea   Gastrointestinal: No heartburn,dysphagia,abdominal pain, nausea / vomiting,rectal bleeding, melena Genitourinary: No dysuria,hematuria, pyuria,  incontinence, nocturia Musculoskeletal: No joint stiffness, joint swelling, weakness,pain Dermatologic: No rash, pruritus, change in appearance of skin Neurologic: No dizziness,headache,syncope, seizures, numbness , tingling Psychiatric: No significant anxiety , depression, insomnia, anorexia Endocrine: No change in hair/skin/ nails, excessive thirst, excessive hunger, excessive urination  Hematologic/lymphatic: No significant bruising, lymphadenopathy,abnormal  bleeding Allergy/immunology: No itchy/ watery eyes, significant sneezing, urticaria, angioedema  Physical exam:  Pertinent or positive findings: Pattern alopecia is present. He has a grade 1/2 systolic murmur at the apex. He has mixed DIP/PIP arthritic changes of the hands. He has scattered scarring of the left forearm. Pedal pulses are decreased on the right. The left lower extremity is in a repetitive motion machine at the time of exam. General appearance:Adequately nourished; no acute distress , increased work of breathing is present.   Lymphatic: No lymphadenopathy about the head, neck, axilla . Eyes: No conjunctival inflammation or lid edema is present. There is no scleral icterus. Ears:  External ear exam shows no significant lesions or deformities.   Nose:  External nasal examination shows no deformity or inflammation. Nasal mucosa are pink and moist without lesions ,exudates Oral exam: lips and gums are healthy appearing.There is no oropharyngeal erythema or exudate . Neck:  No thyromegaly, masses, tenderness noted.    Heart:  Normal rate and regular rhythm. S1 and S2 normal without gallop, click, rub .  Lungs:Chest clear to auscultation without wheezes, rhonchi,rales , rubs. Abdomen:Bowel sounds are normal. Abdomen is soft and nontender with no organomegaly, hernias,masses. GU: deferred as previously addressed. Extremities:  No cyanosis, clubbing,edema  Neurologic exam : Strength equal  in upper extremities Balance,Rhomberg,finger to nose testing could not be completed due to clinical state Skin: Warm & dry w/o tenting. No significant lesions or rash.    See summary under each active problem in the Problem List with associated updated therapeutic plan

## 2015-09-08 NOTE — Assessment & Plan Note (Signed)
Continuous passive motion machine as per Dr. Romeo AppleHarrison

## 2015-09-08 NOTE — Assessment & Plan Note (Signed)
Blood pressure goals discussed No change in medications is  indicated

## 2015-09-08 NOTE — Patient Instructions (Signed)
Probiotic , Florastor OR Align, every day if the bowels are loose. This will replace the normal bacteria which  are necessary for formation of normal stool and processing of food.

## 2015-09-12 ENCOUNTER — Non-Acute Institutional Stay (SKILLED_NURSING_FACILITY): Payer: Medicare Other | Admitting: Internal Medicine

## 2015-09-12 ENCOUNTER — Encounter: Payer: Self-pay | Admitting: Internal Medicine

## 2015-09-12 DIAGNOSIS — Z96652 Presence of left artificial knee joint: Secondary | ICD-10-CM

## 2015-09-12 DIAGNOSIS — M1712 Unilateral primary osteoarthritis, left knee: Secondary | ICD-10-CM

## 2015-09-12 NOTE — Progress Notes (Signed)
Location:  [facility] Room Number: 131/P Place of Service:  SNF [phone](31) Provider:  Phylis BougieArlo Chosen Geske  Zack Hall, MD  Patient Care Team: Benita StabileJohn Z Hall, MD as PCP - General (Internal Medicine)  Extended Emergency Contact Information Primary Emergency Contact: Loren RacerPyrtle,Gary  [place] Home Phone: [phone] Mobile Phone: [phone] Relation: Son Secondary Emergency Contact: Tonnesen,Laurie Address: [address]          [place], [place] [id number] [place] Mobile Phone: [phone] Relation: Daughter  Code Status:  DNR Goals of care: Advanced Directive information Advanced Directives 09/12/2015  Does patient have an advance directive? Yes  Type of Advance Directive Living will  Does patient want to make changes to advanced directive? No - Patient declined  Copy of advanced directive(s) in chart? Yes     Chief Complaint  Patient presents with  . Acute Visit    Patients c/o needs pain meds    HPI:  Pt is a 82 y.o. male seen today for an acute visit for nursing concerns that patient may need stronger pain medicines or muscle relaxer.  Patient is status post left knee arthroplasty secondary to end-stage osteo arthritis.  He is receiving Norco 5-3 25 mg every 6 hours when necessary pain.  When I saw patient today he said he was not having any significant pain he thought the current pain medications were effective-family is in the room as well as in the do not state that he is having extensive pain at this time.  He does have a continuous motion machine upright to his left leg at this time.  He is resting in bed comfortably vital signs appear to be stable   Past Medical History  Diagnosis Date  . Hypertension   . Dyslipidemia   . IBS (irritable bowel syndrome)     With loose stool  . Arthritis   . Macular degeneration disease    Past Surgical History  Procedure Laterality Date  . Back surgeries  x    x 3  . Knee cartilage  surgery    . Toe surgery    . Elbow surgery    . Shoulder surgery      rotator cuff repair (rt)  . Tonsillectomy    . Yag laser application Right 05/29/2012    Procedure: YAG LASER APPLICATION;  Surgeon: Susa Simmondsarroll F Haines, MD;  Location: [facility];  Service: Ophthalmology;  Laterality: Right;  . Total knee arthroplasty Left 09/03/2015    Procedure: TOTAL KNEE ARTHROPLASTY;  Surgeon: Vickki HearingStanley E Harrison, MD;  Location: [facility];  Service: Orthopedics;  Laterality: Left;    Allergies  Allergen Reactions  . Sulfa Antibiotics Nausea And Vomiting    Current Outpatient Prescriptions on File Prior to Visit  Medication Sig Dispense Refill  . aspirin EC 325 MG EC tablet Take 1 tablet (325 mg total) by mouth daily with breakfast. 30 tablet 0  . Calcium Carb-Cholecalciferol (CALCIUM 600 + D) 600-200 MG-UNIT TABS Take 1 tablet by mouth 2 (two) times daily. Reported on 06/09/2015    . fish oil-omega-3 fatty acids 1000 MG capsule Take 1 g by mouth 2 (two) times daily. Reported on 06/09/2015    . HYDROcodone-acetaminophen (NORCO/VICODIN) 5-325 MG tablet Take 1 tablet by mouth every 6 (six) hours as needed for moderate pain. DO NOT EXCEED 4GM OF APAP IN 24 HOURS FROM ALL SOURCES 120 tablet 0  . LORazepam (ATIVAN) 0.5 MG tablet Take 0.25 mg by mouth every 8 (eight)  hours.    . losartan (COZAAR) 50 MG tablet Take 50 mg by mouth at bedtime.     . Misc Natural Products (BLACK CHERRY CONCENTRATE PO) Take 1 capsule by mouth daily.    . Multiple Vitamins-Minerals (MULTIVITAMIN WITH MINERALS) tablet Take 1 tablet by mouth at bedtime.    . Multiple Vitamins-Minerals (PRESERVISION AREDS 2 PO) Take 250-200-40-1 mg-unit-mg-mg by mouth twice a day    . polycarbophil (FIBERCON) 625 MG tablet Take 1,250 mg by mouth 2 (two) times daily. Reported on 06/09/2015    . Probiotic Product (DIGESTIVE ADVANTAGE) CAPS Take 1 capsule by mouth daily.    . psyllium (METAMUCIL) 58.6 % powder Take 1 packet by mouth 2 (two) times daily. Reported  on 06/09/2015    . simvastatin (ZOCOR) 40 MG tablet Take 20 mg by mouth at bedtime. Reported on 06/09/2015     No current facility-administered medications on file prior to visit.    Review of Systems  General does not complaining any fever or chills.  Skin does not complain of rashes or itching.  Resp-- No complaints of shortness breath or cough.  Cardiac does not complain of chest pain.  Muscle skeletal-states that he is not currently in pain-denies the need for stronger pain medication at this time.  Neurologic is not complaining of dizziness or headache     There is no immunization history on file for this patient. Pertinent  Health Maintenance Due  Topic Date Due  . PNA vac Low Risk Adult (1 of 2 - PCV13) 09/07/2016 (Originally 01/27/1995)  . INFLUENZA VACCINE  10/28/2015   No flowsheet data found. Functional Status Survey:    Filed Vitals:   09/12/15 1150  BP: 118/67  Pulse: 84  Temp: 97.8 F (36.6 C)  TempSrc: Oral  Resp: 20  Weight: 177 lb 3.2 oz (80.377 kg)   Body mass index is 24.73 kg/(m^2). Physical Exam   In general this is a somewhat frail [place] male in no distress lying comfortably in bed.  His skin is warm and dry.  Chest is clear to auscultation there is no labored breathing.  Heart is regular rate and rhythm with a slight 1 to 2/6 systolic murmur-he does not have significant lower extremity edema.  Musculoskeletal does have his left leg and a continuous motion machine-currently surgical site is covered from what I can see Staples are in place a do not see evidence of erythema or drainage or infection-there is edema around the surgical site which would be expected-minimal lower extremity edema however  His other extremities at baseline.  Neurologic is grossly intact his speech is clear  Labs reviewed:  Recent Labs  08/28/15 1115 09/04/15 0427  NA 138 135  K 4.1 3.6  CL 106 101  CO2 26 29  GLUCOSE 115* 108*  BUN 20 11  CREATININE 1.00  0.79  CALCIUM 9.0 8.0*   No results for input(s): AST, ALT, ALKPHOS, BILITOT, PROT, ALBUMIN in the last 8760 hours.  Recent Labs  08/28/15 1115 09/04/15 0427 09/05/15 0401  WBC 6.8 11.6* 15.7*  NEUTROABS 5.1  --   --   HGB 14.0 12.3* 12.0*  HCT 42.4 35.9* 33.6*  MCV 95.9 95.5 95.2  PLT 219 187 177   Lab Results  Component Value Date   TSH 1.502 01/21/2011   No results found for: HGBA1C No results found for: CHOL, HDL, LDLCALC, LDLDIRECT, TRIG, CHOLHDL  Significant Diagnostic Results in last 30 days:  Dg Knee Left Port  09/03/2015  CLINICAL DATA:  Total knee replacement. EXAM: PORTABLE LEFT KNEE - 1-2 VIEW COMPARISON:  06/09/2015 FINDINGS: Evidence of a interval right total knee arthroplasty with prosthetic components intact and normally located. Post surgical drain over the superior aspect of the knee joint. Skin staples over the anterior soft tissues. Vascular calcifications are present. IMPRESSION: Expected findings post left total knee arthroplasty. No complicating features. Electronically Signed   By: Elberta Fortis M.D.   On: 09/03/2015 10:16    Assessment/Plan #1-history of left knee replacement-question pain-per discussion with patient this afternoon he is denying any unrelieved pain with the Norco-at this point would be hesitant to add any stronger pain medication or muscle relaxer-continue to monitor for any signs of increased pain but he appears comfortable this afternoon he states this as well-family is in the room as well and did not express any concerns about pain-but this will have to be watched.  [id number]      London Sheer, CMA [phone]

## 2015-09-12 NOTE — Progress Notes (Signed)
Patient ID: Christopher Warner, male   DOB: 1927/05/20, 82 y.o.   MRN: [id number]

## 2015-09-15 ENCOUNTER — Ambulatory Visit: Payer: Medicare Other | Admitting: Orthopedic Surgery

## 2015-09-15 VITALS — BP 127/66 | HR 76 | Ht 71.0 in | Wt 172.0 lb

## 2015-09-15 DIAGNOSIS — Z96652 Presence of left artificial knee joint: Secondary | ICD-10-CM

## 2015-09-15 DIAGNOSIS — Z4789 Encounter for other orthopedic aftercare: Secondary | ICD-10-CM

## 2015-09-15 NOTE — Progress Notes (Signed)
Patient ID: Christopher Warner, male   DOB: 05/17/1927, 82 y.o.   MRN: [id number]  Chief Complaint  Patient presents with  . Routine Post Op    Left TKA DOS 09/03/15    HPI Christopher Warner is a 82 y.o. male.  Left total knee 12 days ago   Allergies  Allergen Reactions  . Sulfa Antibiotics Nausea And Vomiting    No current outpatient prescriptions on file.   No current facility-administered medications for this visit.      Physical Exam Physical Exam Blood pressure 127/66, pulse 76, height 5\' 11"  (1.803 m), weight 172 lb (78.019 kg).  Appearance of incision: Incision is clean dry and intact we removed 1/2 the staples  The calf was supple and the Homans sign was normal, there is minimal peripheral edema  Assessment and plan The patient is doing well and is in good condition  Follow-up will be3 weeks Remove the remaining staples Wednesday  Discharge on Friday

## 2015-09-16 ENCOUNTER — Non-Acute Institutional Stay (SKILLED_NURSING_FACILITY): Payer: Medicare Other | Admitting: Internal Medicine

## 2015-09-16 ENCOUNTER — Encounter: Payer: Self-pay | Admitting: Internal Medicine

## 2015-09-16 DIAGNOSIS — I10 Essential (primary) hypertension: Secondary | ICD-10-CM | POA: Diagnosis not present

## 2015-09-16 DIAGNOSIS — M1712 Unilateral primary osteoarthritis, left knee: Secondary | ICD-10-CM | POA: Diagnosis not present

## 2015-09-16 NOTE — Progress Notes (Signed)
Location:  [facility] Room Number: 131/P Place of Service:  SNF [phone]) Provider:  Phylis BougieArlo Lassen  Zack Hall, MD  Patient Care Team: Benita StabileJohn Z Hall, MD as PCP - General (Internal Medicine)  Extended Emergency Contact Information Primary Emergency Contact: Loren RacerPyrtle,Gary  [place] Home Phone: [phone] Mobile Phone: [phone] Relation: Son Secondary Emergency Contact: Wishon,Laurie Address: [address]          [place], [place] [address] [place] Mobile Phone: [phone] Relation: Daughter  Code Status:  DNR Goals of care: Advanced Directive information Advanced Directives 09/16/2015  Does patient have an advance directive? Yes  Type of Advance Directive Out of facility DNR (pink MOST or yellow form);Living will  Does patient want to make changes to advanced directive? No - Patient declined  Copy of advanced directive(s) in chart? Yes     Chief complaint acute visit secondary to left knee discomfort during therapy status post left knee replacement-also pharmacy question about simvastatin Norvasc interaction  HPI:  Pt is a 82 y.o. male seen today for an acute visit for follow-up of left knee replacement-patient did have end-stage osteoarthritis and underwent a left TKA on 09/03/2015.  Postop hemoglobin was 12.  This was done secondary to end-stage osteoarthritis.  His stay here is been fairly unremarkable he is receiving Norco as needed for pain-and is followed by orthopedics here she saw Dr. Romeo AppleHarrison yesterdayThought to be doing well and thought appropriate for discharge on Friday.   he was slated for discharge earlier-I did discuss this with therapy who is concerned thinking he could benefit from a few more days of therapy--initial understanding was atient was fairly adamant about going home-however per discussion with nursing as well as with patient he is amenable to waiting till later in the week  Therapy does feel that he  is having pain at times with therapy and feel he would benefit from a muscle relaxer since appears to be apparently some cramping involved-he does receive Norco 5-3 25 mg every 6 hours when necessary for pain he says this is largely effective although he doesn't midis having some pain with therapy.  Pharmacy is also left a note about an interaction between his Norvasc and simvastatin apparently Norvasc 1 administered with simvastatin will increase the levels of the statin-there was concern for renal failure and rhabdomyolysis patient maintained on both with the maximal dose of simvastatin.  Recommendation by pharmacies to reduce his simvastatin dose to 20 mg and separating the administration times of the Norvasc and simvastatin  Drr Alwyn RenHopper did write an order to do this and decrease the Norvasc to 5 mg from 10 mg  A day secondary to stable blood pressures he noted it was 118/67.  Despite the lower dose of Norvasc blood pressures do appear to be stable most recently 111/62. Marland Kitchen.         Past Medical History  Diagnosis Date  . Hypertension   . Dyslipidemia   . IBS (irritable bowel syndrome)     With loose stool  . Arthritis   . Macular degeneration disease    Past Surgical History  Procedure Laterality Date  . Back surgeries  x    x 3  . Knee cartilage surgery    . Toe surgery    . Elbow surgery    . Shoulder surgery      rotator cuff repair (rt)  . Tonsillectomy    . Yag laser application Right 05/29/2012  Procedure: YAG LASER APPLICATION;  Surgeon: Susa Simmonds, MD;  Location: [facility];  Service: Ophthalmology;  Laterality: Right;  . Total knee arthroplasty Left 09/03/2015    Procedure: TOTAL KNEE ARTHROPLASTY;  Surgeon: Vickki Hearing, MD;  Location: [facility];  Service: Orthopedics;  Laterality: Left;    Allergies  Allergen Reactions  . Sulfa Antibiotics Nausea And Vomiting    Current Outpatient Prescriptions on File Prior to Visit  Medication Sig Dispense Refill  .  amLODipine (NORVASC) 5 MG tablet Take 5 mg by mouth daily.    Marland Kitchen aspirin EC 325 MG EC tablet Take 1 tablet (325 mg total) by mouth daily with breakfast. 30 tablet 0  . Calcium Carb-Cholecalciferol (CALCIUM 600 + D) 600-200 MG-UNIT TABS Take 1 tablet by mouth 2 (two) times daily. Reported on 06/09/2015    . fish oil-omega-3 fatty acids 1000 MG capsule Take 1 g by mouth 2 (two) times daily. Reported on 06/09/2015    . HYDROcodone-acetaminophen (NORCO/VICODIN) 5-325 MG tablet Take 1 tablet by mouth every 6 (six) hours as needed for moderate pain. DO NOT EXCEED 4GM OF APAP IN 24 HOURS FROM ALL SOURCES 120 tablet 0  . LORazepam (ATIVAN) 0.5 MG tablet Take 0.25 mg by mouth every 8 (eight) hours.    Marland Kitchen losartan (COZAAR) 50 MG tablet Take 50 mg by mouth at bedtime.     . Misc Natural Products (BLACK CHERRY CONCENTRATE PO) Take 1 capsule by mouth daily.    . Multiple Vitamins-Minerals (MULTIVITAMIN WITH MINERALS) tablet Take 1 tablet by mouth at bedtime.    . Multiple Vitamins-Minerals (PRESERVISION AREDS 2 PO) Take 250-200-40-1 mg-unit-mg-mg by mouth twice a day    . polycarbophil (FIBERCON) 625 MG tablet Take 1,250 mg by mouth 2 (two) times daily. Reported on 06/09/2015    . Probiotic Product (DIGESTIVE ADVANTAGE) CAPS Take 1 capsule by mouth daily.    . psyllium (METAMUCIL) 58.6 % powder Take 1 packet by mouth 2 (two) times daily. Reported on 06/09/2015    . simvastatin (ZOCOR) 40 MG tablet Take 20 mg by mouth at bedtime. Reported on 06/09/2015     No current facility-administered medications on file prior to visit.     Review of Systems   In general he is not complaining of any fever or chills.  Skin does not complain of rashes or itching surgical site per orthopedic evaluation nursing evaluation appears to be benign without sign of infection staples are scheduled to be removed tomorrow.  Respirations not complaining of any shortness breath or cough.  Cardiac denies any chest pain.  GI is not  complaining of abdominal discomfort nausea vomiting diarrhea or constipation appetite apparently is bed he does complain about the coffee however.  Musculoskeletal not currently complaining of joint pain says he Norco is effective but both he and rehabilitation say he is having some pain with therapy.  Neurologic is not complaining of dizziness headache or numbness.  Psych does appear to have some history of dementia but he is pleasant and talkative appears to be mild/moderate   There is no immunization history on file for this patient. Pertinent  Health Maintenance Due  Topic Date Due  . PNA vac Low Risk Adult (1 of 2 - PCV13) 09/07/2016 (Originally 01/27/1995)  . INFLUENZA VACCINE  10/28/2015   No flowsheet data found. Functional Status Survey:    Filed Vitals:   09/16/15 1105  BP: 111/62  Pulse: 63  Temp: 98.3 F (36.8 C)  TempSrc: Oral  Resp: 16  Height: 5\' 11"  (1.803 m)  Weight: 172 lb (78.019 kg)   Body mass index is 24 kg/(m^2). Physical Exam    In general this is a pleasant elderly male in no distress resting comfortably in bed.  His skin is warm and dry there is covering of the surgical site per orthopedic evaluation yesterday this is stable with no sign of infection.  Chest is clear to auscultation there is no labored breathing.  Heart is regular rate and rhythm with a slight 1 to 2/6 systolic murmur he has minimal lower extremity edema r person holes in place lower extremity.  Abdomen is soft nontender with positive bowel sounds.  Musculoskeletal does move all extremities he does have the left lower leg in a repetitive motion machine again surgical site is covered as noted above but per orthopedic evaluation nursing evaluation is benign appearing.  Neurologic is grossly intact his speech is clear no lateralizing findings.  Psych he is pleasant and grossly oriented and conversant although does have confusion at times.       Labs reviewed:  Recent  Labs  08/28/15 1115 09/04/15 0427  NA 138 135  K 4.1 3.6  CL 106 101  CO2 26 29  GLUCOSE 115* 108*  BUN 20 11  CREATININE 1.00 0.79  CALCIUM 9.0 8.0*   No results for input(s): AST, ALT, ALKPHOS, BILITOT, PROT, ALBUMIN in the last 8760 hours.  Recent Labs  08/28/15 1115 09/04/15 0427 09/05/15 0401  WBC 6.8 11.6* 15.7*  NEUTROABS 5.1  --   --   HGB 14.0 12.3* 12.0*  HCT 42.4 35.9* 33.6*  MCV 95.9 95.5 95.2  PLT 219 187 177   Lab Results  Component Value Date   TSH 1.502 01/21/2011   No results found for: HGBA1C No results found for: CHOL, HDL, LDLCALC, LDLDIRECT, TRIG, CHOLHDL  Significant Diagnostic Results in last 30 days:  Dg Knee Left Port  09/03/2015  CLINICAL DATA:  Total knee replacement. EXAM: PORTABLE LEFT KNEE - 1-2 VIEW COMPARISON:  06/09/2015 FINDINGS: Evidence of a interval right total knee arthroplasty with prosthetic components intact and normally located. Post surgical drain over the superior aspect of the knee joint. Skin staples over the anterior soft tissues. Vascular calcifications are present. IMPRESSION: Expected findings post left total knee arthroplasty. No complicating features. Electronically Signed   By: Elberta Fortis M.D.   On: 09/03/2015 10:16    Assessment/Plan  #1-history of left knee replacement secondary osteoarthritis apparently Norco is effective for pain but he does have some increased pain with therapy thought to be possibly muscle related per discussion with therapists today-will start Robaxin 500 mg routinely before therapy to see if this will help we'll try to be judicious in using this however.  In regards hypertension this appears stable despite the lower dose of Norvasc he is also on valsartan 100 mg a day recent blood pressures 111/60 2I note systolic was 127/66  at the orthopedic physician yesterday this appears to be stable.  Again his simvastatin has been reduced secondary to interaction with Norvasc.  In anticipation of  discharge will update CBC and metabolic panel to insure stability before discharge.  [id number] note greater than 25 minutes spent assessing patient-discussing his status  with therapy-and coordinating and formulating a plan of care-of note greater than 50% of time spent coordinating plan of care including discussion with therapy       London Sheer, CMA [phone]

## 2015-09-18 ENCOUNTER — Encounter (HOSPITAL_COMMUNITY)
Admission: RE | Admit: 2015-09-18 | Discharge: 2015-09-18 | Disposition: A | Discharge status: Home / Self Care | Payer: Medicare Other | Source: Skilled Nursing Facility | Attending: Internal Medicine | Admitting: Internal Medicine

## 2015-09-18 DIAGNOSIS — Z96652 Presence of left artificial knee joint: Secondary | ICD-10-CM | POA: Diagnosis not present

## 2015-09-18 DIAGNOSIS — Z9181 History of falling: Secondary | ICD-10-CM | POA: Diagnosis not present

## 2015-09-18 DIAGNOSIS — E785 Hyperlipidemia, unspecified: Secondary | ICD-10-CM | POA: Insufficient documentation

## 2015-09-18 DIAGNOSIS — Z471 Aftercare following joint replacement surgery: Secondary | ICD-10-CM | POA: Diagnosis not present

## 2015-09-18 DIAGNOSIS — Z978 Presence of other specified devices: Secondary | ICD-10-CM | POA: Diagnosis not present

## 2015-09-18 LAB — CBC WITH DIFFERENTIAL/PLATELET
BASOS ABS: 0 10*3/uL (ref 0.0–0.1)
Basophils Relative: 0 %
EOS ABS: 0.2 10*3/uL (ref 0.0–0.7)
EOS PCT: 3 %
HCT: 37.3 % — ABNORMAL LOW (ref 39.0–52.0)
Hemoglobin: 12.3 g/dL — ABNORMAL LOW (ref 13.0–17.0)
Lymphocytes Relative: 18 %
Lymphs Abs: 1.2 10*3/uL (ref 0.7–4.0)
MCH: 32 pg (ref 26.0–34.0)
MCHC: 33 g/dL (ref 30.0–36.0)
MCV: 97.1 fL (ref 78.0–100.0)
Monocytes Absolute: 0.5 10*3/uL (ref 0.1–1.0)
Monocytes Relative: 7 %
Neutro Abs: 4.9 10*3/uL (ref 1.7–7.7)
Neutrophils Relative %: 72 %
PLATELETS: 409 10*3/uL — AB (ref 150–400)
RBC: 3.84 MIL/uL — AB (ref 4.22–5.81)
RDW: 13.5 % (ref 11.5–15.5)
WBC: 6.9 10*3/uL (ref 4.0–10.5)

## 2015-09-18 LAB — BASIC METABOLIC PANEL
ANION GAP: 6 (ref 5–15)
BUN: 18 mg/dL (ref 6–20)
CO2: 30 mmol/L (ref 22–32)
Calcium: 8.7 mg/dL — ABNORMAL LOW (ref 8.9–10.3)
Chloride: 103 mmol/L (ref 101–111)
Creatinine, Ser: 0.91 mg/dL (ref 0.61–1.24)
GFR calc Af Amer: >60 mL/min (ref >60)
GFR calc non Af Amer: >60 mL/min (ref >60)
Glucose, Bld: 96 mg/dL (ref 65–99)
POTASSIUM: 4 mmol/L (ref 3.5–5.1)
SODIUM: 139 mmol/L (ref 135–145)

## 2015-09-25 DIAGNOSIS — Z961 Presence of intraocular lens: Secondary | ICD-10-CM | POA: Diagnosis not present

## 2015-09-25 DIAGNOSIS — H538 Other visual disturbances: Secondary | ICD-10-CM | POA: Diagnosis not present

## 2015-09-29 ENCOUNTER — Ambulatory Visit (HOSPITAL_COMMUNITY): Payer: Medicare Other | Attending: Internal Medicine | Admitting: Physical Therapy

## 2015-09-29 DIAGNOSIS — M6281 Muscle weakness (generalized): Secondary | ICD-10-CM

## 2015-09-29 DIAGNOSIS — R262 Difficulty in walking, not elsewhere classified: Secondary | ICD-10-CM | POA: Diagnosis not present

## 2015-09-29 DIAGNOSIS — G3184 Mild cognitive impairment, so stated: Secondary | ICD-10-CM | POA: Diagnosis not present

## 2015-09-29 DIAGNOSIS — I10 Essential (primary) hypertension: Secondary | ICD-10-CM | POA: Diagnosis not present

## 2015-09-29 DIAGNOSIS — M25662 Stiffness of left knee, not elsewhere classified: Secondary | ICD-10-CM | POA: Insufficient documentation

## 2015-09-29 DIAGNOSIS — R600 Localized edema: Secondary | ICD-10-CM | POA: Insufficient documentation

## 2015-09-29 DIAGNOSIS — S88012A Complete traumatic amputation at knee level, left lower leg, initial encounter: Secondary | ICD-10-CM | POA: Diagnosis not present

## 2015-09-29 NOTE — Patient Instructions (Signed)
   QUAD SET WITH TOWEL UNDER HEEL  While lying or sitting with a small towel roll under your ankle, tighten your top thigh muscle to press the back of your knee downward towards the ground.  Hold for 3-5 seconds and repeat 10-15 times, at least 5 times per day.    AAROM HAMSTRING CURL (YOU DO NOT NEED THE BAND)  While seated , bend your knee and draw back your foot as far as you can on the left; you can put your right leg in front to help draw the left leg into a more bent position.  Hold for 3-5 seconds, and repeat 10-15 times, at least 5 times per day.     SEATED HAMSTRING STRETCH  While seated, rest your heel on the floor with your knee straight and gently lean forward until a stretch is felt behind your knee/thigh.  Hold for 30 seconds and repeat 3 times on the left leg, at least 2-3 times per day.    WEIGHT SHIFT - LATERAL  While in a standing position and knees partially bent, slowly shift your body weight side-to-side.   Repeat 20-30 times, 2-3 times per day.

## 2015-09-29 NOTE — Therapy (Signed)
[facility] [address] [place], [place], [address] Phone: [phone]   Fax:  [phone]  Physical Therapy Evaluation  Patient Details  Name: Christopher Warner MRN: [id number] Date of Birth: 13-Jan-1928 Referring Provider: Fuller Canada   Encounter Date: 09/29/2015      PT End of Session - 09/29/15 1736    Visit Number 1   Number of Visits 18   Date for PT Re-Evaluation 10/20/15   Authorization Type [employer] Medicare    Authorization Time Period 09/29/15 to 11/10/15   Authorization - Visit Number 1   Authorization - Number of Visits 10   PT Start Time 1645   PT Stop Time 1727   PT Time Calculation (min) 42 min   Activity Tolerance Patient tolerated treatment well   Behavior During Therapy [facility] for tasks assessed/performed      Past Medical History  Diagnosis Date  . Hypertension   . Dyslipidemia   . IBS (irritable bowel syndrome)     With loose stool  . Arthritis   . Macular degeneration disease     Past Surgical History  Procedure Laterality Date  . Back surgeries  x    x 3  . Knee cartilage surgery    . Toe surgery    . Elbow surgery    . Shoulder surgery      rotator cuff repair (rt)  . Tonsillectomy    . Yag laser application Right 05/29/2012    Procedure: YAG LASER APPLICATION;  Surgeon: Susa Simmonds, MD;  Location: [facility];  Service: Ophthalmology;  Laterality: Right;  . Total knee arthroplasty Left 09/03/2015    Procedure: TOTAL KNEE ARTHROPLASTY;  Surgeon: Vickki Hearing, MD;  Location: [facility];  Service: Orthopedics;  Laterality: Left;    There were no vitals filed for this visit.       Subjective Assessment - 09/29/15 1649    Subjective Patient reports that he had been having quite a bit of pain in his L knee; he finally went to the MD who said he needed knee surgery. His operation was done on June 7th with Dr. Romeo Apple. He went to the [facility] and did not get any home health after this. His knee has  been sore and swollen; he reoprts he can walk pretty good with the walker (but he was not using any device before). It is hard for him to get his shoes on due to knee stiffness. No falls or close calls recently.    Pertinent History HTN, macular degeneration, history of several back surgeries    How long can you sit comfortably? cannot sit comfortable long, almost immediate discomfort    How long can you stand comfortably? 10-15 minutes    How long can you walk comfortably? maybe 2000-2564ft with walker    Patient Stated Goals get back to PLOF    Currently in Pain? Yes   Pain Score 8    Pain Location Knee   Pain Orientation Left   Pain Descriptors / Indicators Tightness   Pain Type Surgical pain   Pain Radiating Towards none    Pain Onset 1 to 4 weeks ago   Pain Frequency Intermittent   Aggravating Factors  movement    Pain Relieving Factors rest    Effect of Pain on Daily Activities cannot get back to PLOF activities             [facility] PT Assessment - 09/29/15 0001  Assessment   Medical Diagnosis L TKR    Referring Provider [name]    Onset Date/Surgical Date 09/03/15   Next MD Visit Dr. Romeo AppleHarrison on Thursday    Balance Screen   Has the patient fallen in the past 6 months No   Has the patient had a decrease in activity level because of a fear of falling?  No   Is the patient reluctant to leave their home because of a fear of falling?  No   Prior Function   Level of Independence Independent;Independent with basic ADLs;Independent with gait;Independent with transfers   Vocation Retired   Leisure grows plants for [employer]    Observation/Other Assessments   Skin Integrity incision appears well healing at this imte with no acute signs of inflammation or infection   Focus on Therapeutic Outcomes (FOTO)  67% limited    Posture/Postural Control   Posture/Postural Control Postural limitations   Postural Limitations Rounded Shoulders;Forward head;Flexed trunk;Weight  shift right   AROM   Left Knee Extension 15   Left Knee Flexion 67   Strength   Right Hip Flexion 4/5   Right Hip Extension 2/5   Right Hip ABduction 4/5   Left Hip Flexion 4-/5   Left Hip Extension 2/5   Left Hip ABduction 4+/5   Right Knee Flexion 4/5   Right Knee Extension 5/5   Left Knee Flexion 4/5  within available ROM    Left Knee Extension 4+/5  within available ROM    Right Ankle Dorsiflexion 5/5   Left Ankle Dorsiflexion 5/5   Ambulation/Gait   Gait Comments flexed posture, reduced motion of L knee/reduced L knee TKE and flexion, ER L hip, rigid trunka nd pelvis    6 minute walk test results    Aerobic Endurance Distance Walked 482   Endurance additional comments 3MWT with rollator    High Level Balance   High Level Balance Comments TUG 15.15 with rollator, unsafe stand to sit                            PT Education - 09/29/15 1736    Education provided Yes   Education Details prognosis, POC, HEP, appropriate use of ice    Person(s) Educated Patient   Methods Explanation;Demonstration;Handout   Comprehension Verbalized understanding;Returned demonstration;Need further instruction          PT Short Term Goals - 09/29/15 1742    PT SHORT TERM GOAL #1   Title Patient will demonstrate L knee ROM of 8-100 degrees in order to improve mechanics and reduce discomfort   Time 3   Period Weeks   Status New   PT SHORT TERM GOAL #2   Title Patient to be able to ambulate unlimited distances with SPC with pain L knee no more than 2/10 in order to improve mobility and QOL    Time 3   Period Weeks   Status New   PT SHORT TERM GOAL #3   Title Patient to be independent in correctly and consistently applying ice, performing scar massage, and regular skin care of L knee in order to assist in improving condition    Time 3   Period Weeks   Status New   PT SHORT TERM GOAL #4   Title Patient to be independent in correctly and consistently performign  appropriate HEP, to be updated PRN    Time 3   Period Weeks   Status New  PT Long Term Goals - 10/01/2015 1744    PT LONG TERM GOAL #1   Title Patient to demonstrate L knee ROM 0-110 degrees in order to improve mechanics and  general gait and mobility   Time 6   Period Weeks   Status New   PT LONG TERM GOAL #2   Title Patient to be able to ambulate unlimited distances with no assistive device, correct mechanics including full heel-toe pattern, equal step/stance lengths/times, upright posture in order to allow him to access community    Time 6   Period Weeks   Status New   PT LONG TERM GOAL #3   Title Patient to be able to reciprocally ascend/descent full flight of stairs with U railing, minimal unsteadiness, good eccentric control in order to allow him to access the community    Time 6   Period Weeks   Status New   PT LONG TERM GOAL #4   Title Patient to report he has been able to retun to regular exercise program, at least 30 minutes in duration, at least 4 days per week, in order to [facility] functional gains and improve overall health status    Time 6   Period Weeks   Status New               Plan - 01-Oct-2015 1737    Clinical Impression Statement Patient arrives s/p L TKR, which he reports was performed on June 7th by Dr. Romeo Apple; he went to the [facility] after surgery and just arrived home last Thursday. At this point the patient states he is having quite a hard time with PLOF based activities, and is also concerned about the edema and stiffness in his knee. Upon examination, patient reveals significant L knee stiffness, localized edema, functional muscle weakness, unsteadiness,  gait impairment, and reduced ability to return to PLOF based activities at this time. Patient will benefit from skilled PT services in order to address functional impairments and to assist in reaching optimal level of function.    Rehab Potential Good   PT Frequency 3x / week   PT  Duration 6 weeks   PT Treatment/Interventions ADLs/Self Care Home Management;Cryotherapy;Moist Heat;DME Instruction;Gait training;Stair training;Functional mobility training;Therapeutic activities;Therapeutic exercise;Balance training;Neuromuscular re-education;Patient/family education;Manual techniques;Scar mobilization;Passive range of motion;Energy conservation;Taping   PT Next Visit Plan review HEP and goals; focus on ROM, gait, edema; progress to strength as ROM improves. Manual PRN.    PT Home Exercise Plan given    Recommended Other Services possible compression sleeve and/or JAS brace    Consulted and Agree with Plan of Care Patient      Patient will benefit from skilled therapeutic intervention in order to improve the following deficits and impairments:  Abnormal gait, Decreased skin integrity, Hypomobility, Decreased scar mobility, Increased edema, Decreased activity tolerance, Decreased strength, Pain, Decreased balance, Decreased mobility, Difficulty walking, Decreased range of motion, Improper body mechanics, Decreased coordination, Impaired flexibility, Postural dysfunction  Visit Diagnosis: Stiffness of left knee, not elsewhere classified - Plan: PT plan of care cert/re-cert  Localized edema - Plan: PT plan of care cert/re-cert  Muscle weakness (generalized) - Plan: PT plan of care cert/re-cert  Difficulty in walking, not elsewhere classified - Plan: PT plan of care cert/re-cert      G-Codes - 10/01/15 1747    Functional Assessment Tool Used FOTO 67% limited    Functional Limitation Mobility: Walking and moving around   Mobility: Walking and Moving Around Current Status (J1914) At least 60 percent but  less than 80 percent impaired, limited or restricted   Mobility: Walking and Moving Around Goal Status [id number]([id number]) At least 40 percent but less than 60 percent impaired, limited or restricted       Problem List Patient Active Problem List   Diagnosis Date Noted  . Essential  hypertension 09/08/2015  . Macular degeneration 09/08/2015  . IBS (irritable bowel syndrome) 09/08/2015  . Dyslipidemia 09/08/2015  . Primary osteoarthritis of left knee   . ACHILLES TENDINITIS 10/02/2009  . RUPTURE ROTATOR CUFF 03/25/2009  . IMPINGEMENT SYNDROME 03/06/2009    Nedra HaiKristen Elicia Lui PT, DPT [phone]  [facility] [address] [place], [place], [address] Phone: [phone]   Fax:  [phone]  Name: Christopher Warner MRN: [id number] Date of Birth: 1927/07/27

## 2015-10-01 DIAGNOSIS — H353221 Exudative age-related macular degeneration, left eye, with active choroidal neovascularization: Secondary | ICD-10-CM | POA: Diagnosis not present

## 2015-10-01 DIAGNOSIS — H353212 Exudative age-related macular degeneration, right eye, with inactive choroidal neovascularization: Secondary | ICD-10-CM | POA: Diagnosis not present

## 2015-10-01 DIAGNOSIS — H353134 Nonexudative age-related macular degeneration, bilateral, advanced atrophic with subfoveal involvement: Secondary | ICD-10-CM | POA: Diagnosis not present

## 2015-10-02 ENCOUNTER — Ambulatory Visit: Payer: Medicare Other | Admitting: Orthopedic Surgery

## 2015-10-02 ENCOUNTER — Encounter: Payer: Self-pay | Admitting: Orthopedic Surgery

## 2015-10-02 VITALS — BP 136/75 | HR 66 | Temp 98.1°F | Ht 71.0 in | Wt 171.0 lb

## 2015-10-02 DIAGNOSIS — Z96652 Presence of left artificial knee joint: Secondary | ICD-10-CM

## 2015-10-02 DIAGNOSIS — Z4789 Encounter for other orthopedic aftercare: Secondary | ICD-10-CM

## 2015-10-02 NOTE — Progress Notes (Signed)
Chief Complaint  Patient presents with  . Routine Post Op    4 weeks post op   DOS 09-03-15  PT notes AROM    Left Knee Extension  15    Left Knee Flexion  67    Very slow progress  Continue pt return in 4 weeks

## 2015-10-08 ENCOUNTER — Ambulatory Visit (HOSPITAL_COMMUNITY): Payer: Medicare Other

## 2015-10-08 DIAGNOSIS — M6281 Muscle weakness (generalized): Secondary | ICD-10-CM | POA: Diagnosis not present

## 2015-10-08 DIAGNOSIS — R262 Difficulty in walking, not elsewhere classified: Secondary | ICD-10-CM | POA: Diagnosis not present

## 2015-10-08 DIAGNOSIS — R600 Localized edema: Secondary | ICD-10-CM

## 2015-10-08 DIAGNOSIS — M25662 Stiffness of left knee, not elsewhere classified: Secondary | ICD-10-CM | POA: Diagnosis not present

## 2015-10-08 NOTE — Therapy (Signed)
[facility] [address] [place], [place], [address] Phone: [phone]   Fax:  [phone]  Physical Therapy Treatment  Patient Details  Name: Christopher Warner MRN: [id number] Date of Birth: 03-07-1928 Referring Provider: Fuller Canada   Encounter Date: 10/08/2015      PT End of Session - 10/08/15 1426    Visit Number 2   Number of Visits 18   Date for PT Re-Evaluation 10/20/15   Authorization Type [employer] Medicare    Authorization Time Period 09/29/15 to 11/10/15   Authorization - Visit Number 2   Authorization - Number of Visits 10   PT Start Time 1352   PT Stop Time 1432   PT Time Calculation (min) 40 min   Activity Tolerance Patient tolerated treatment well   Behavior During Therapy [facility] for tasks assessed/performed      Past Medical History  Diagnosis Date  . Hypertension   . Dyslipidemia   . IBS (irritable bowel syndrome)     With loose stool  . Arthritis   . Macular degeneration disease     Past Surgical History  Procedure Laterality Date  . Back surgeries  x    x 3  . Knee cartilage surgery    . Toe surgery    . Elbow surgery    . Shoulder surgery      rotator cuff repair (rt)  . Tonsillectomy    . Yag laser application Right 05/29/2012    Procedure: YAG LASER APPLICATION;  Surgeon: Susa Simmonds, MD;  Location: [facility];  Service: Ophthalmology;  Laterality: Right;  . Total knee arthroplasty Left 09/03/2015    Procedure: TOTAL KNEE ARTHROPLASTY;  Surgeon: Vickki Hearing, MD;  Location: [facility];  Service: Orthopedics;  Laterality: Left;    There were no vitals filed for this visit.      Subjective Assessment - 10/08/15 1356    Subjective Pt reports he went grocery shopping earlier today and had some increased pain following.  Pain medication taken around an hour before therapy, currently pain free.   Pertinent History HTN, macular degeneration, history of several back surgeries    Patient Stated Goals get  back to PLOF    Currently in Pain? No/denies              [facility] Adult PT Treatment/Exercise - 10/08/15 0001    Exercises   Exercises Knee/Hip   Knee/Hip Exercises: Stretches   Active Hamstring Stretch 3 reps;30 seconds   Active Hamstring Stretch Limitations supine with rope   Knee/Hip Exercises: Supine   Quad Sets 2 sets;10 reps   Quad Sets Limitations tactile and verbal cueing   Short Arc Quad Sets 15 reps   Heel Slides 10 reps   Knee Extension PROM;3 sets   Knee Flexion PROM   Manual Therapy   Manual Therapy Edema management;Joint mobilization;Passive ROM;Soft tissue mobilization;Myofascial release   Manual therapy comments Manual complete separate rest of treatment    Edema Management Retro massage with LE elevated   Joint Mobilization patella mobs all directions, fib/tib A/P   Soft tissue mobilization quadriceps   Myofascial Release scar tissue massage   Passive ROM extension and flexion            PT Education - 10/08/15 1839    Education provided Yes   Education Details Reviewed goals, complaince and assured correct technique with HEP, copy of eval given to pt., Explained importance of ROM based exercises as well as benefits  of ice for pain and edema control.     Person(s) Educated Patient   Methods Explanation;Demonstration;Handout   Comprehension Verbalized understanding;Returned demonstration;Need further instruction          PT Short Term Goals - 09/29/15 1742    PT SHORT TERM GOAL #1   Title Patient will demonstrate L knee ROM of 8-100 degrees in order to improve mechanics and reduce discomfort   Time 3   Period Weeks   Status New   PT SHORT TERM GOAL #2   Title Patient to be able to ambulate unlimited distances with SPC with pain L knee no more than 2/10 in order to improve mobility and QOL    Time 3   Period Weeks   Status New   PT SHORT TERM GOAL #3   Title Patient to be independent in correctly and consistently applying ice, performing scar  massage, and regular skin care of L knee in order to assist in improving condition    Time 3   Period Weeks   Status New   PT SHORT TERM GOAL #4   Title Patient to be independent in correctly and consistently performign appropriate HEP, to be updated PRN    Time 3   Period Weeks   Status New           PT Long Term Goals - 09/29/15 1744    PT LONG TERM GOAL #1   Title Patient to demonstrate L knee ROM 0-110 degrees in order to improve mechanics and  general gait and mobility   Time 6   Period Weeks   Status New   PT LONG TERM GOAL #2   Title Patient to be able to ambulate unlimited distances with no assistive device, correct mechanics including full heel-toe pattern, equal step/stance lengths/times, upright posture in order to allow him to access community    Time 6   Period Weeks   Status New   PT LONG TERM GOAL #3   Title Patient to be able to reciprocally ascend/descent full flight of stairs with U railing, minimal unsteadiness, good eccentric control in order to allow him to access the community    Time 6   Period Weeks   Status New   PT LONG TERM GOAL #4   Title Patient to report he has been able to retun to regular exercise program, at least 30 minutes in duration, at least 4 days per week, in order to [facility] functional gains and improve overall health status    Time 6   Period Weeks   Status New               Plan - 10/08/15 1836    Clinical Impression Statement Reviewed goals, complaince and technqiue with HEP exercises and copy of eval given to pt.  Session focus on manual technqiues to assist with edema control and therex to improve ROM.  Pt demosntrated appropriate technqiue with all exercises with min cueing for form, technqiue and posture with gait.     Rehab Potential Good   PT Frequency 3x / week   PT Duration 6 weeks   PT Treatment/Interventions ADLs/Self Care Home Management;Cryotherapy;Moist Heat;DME Instruction;Gait training;Stair  training;Functional mobility training;Therapeutic activities;Therapeutic exercise;Balance training;Neuromuscular re-education;Patient/family education;Manual techniques;Scar mobilization;Passive range of motion;Energy conservation;Taping   PT Next Visit Plan focus on ROM, gait, edema; progress to strength as ROM improves. Manual PRN.       Patient will benefit from skilled therapeutic intervention in order to improve the following deficits  and impairments:  Abnormal gait, Decreased skin integrity, Hypomobility, Decreased scar mobility, Increased edema, Decreased activity tolerance, Decreased strength, Pain, Decreased balance, Decreased mobility, Difficulty walking, Decreased range of motion, Improper body mechanics, Decreased coordination, Impaired flexibility, Postural dysfunction  Visit Diagnosis: Stiffness of left knee, not elsewhere classified  Localized edema  Muscle weakness (generalized)  Difficulty in walking, not elsewhere classified     Problem List Patient Active Problem List   Diagnosis Date Noted  . Essential hypertension 09/08/2015  . Macular degeneration 09/08/2015  . IBS (irritable bowel syndrome) 09/08/2015  . Dyslipidemia 09/08/2015  . Primary osteoarthritis of left knee   . ACHILLES TENDINITIS 10/02/2009  . RUPTURE ROTATOR CUFF 03/25/2009  . IMPINGEMENT SYNDROME 03/06/2009   Becky Saxasey Ventura Leggitt, LPTA; CBIS [phone]  Juel BurrowCockerham, Padme Arriaga Jo 10/08/2015, 6:40 PM  [facility] [address] [place], [place], [address] Phone: [phone]   Fax:  [phone]  Name: Christopher Warner MRN: [id number] Date of Birth: April 26, 1927

## 2015-10-13 ENCOUNTER — Ambulatory Visit (HOSPITAL_COMMUNITY): Payer: Medicare Other | Admitting: Physical Therapy

## 2015-10-13 DIAGNOSIS — R600 Localized edema: Secondary | ICD-10-CM

## 2015-10-13 DIAGNOSIS — M6281 Muscle weakness (generalized): Secondary | ICD-10-CM | POA: Diagnosis not present

## 2015-10-13 DIAGNOSIS — R262 Difficulty in walking, not elsewhere classified: Secondary | ICD-10-CM | POA: Diagnosis not present

## 2015-10-13 DIAGNOSIS — M25662 Stiffness of left knee, not elsewhere classified: Secondary | ICD-10-CM

## 2015-10-13 NOTE — Therapy (Signed)
[facility] [address] [place], [place], [address] Phone: [phone]   Fax:  [phone]  Physical Therapy Treatment  Patient Details  Name: Christopher Warner MRN: [id number] Date of Birth: 1927/09/22 Referring Provider: [name]   Encounter Date: 10/13/2015      PT End of Session - 10/13/15 1515    Visit Number 3   Number of Visits 18   Date for PT Re-Evaluation 10/20/15   Authorization Type [employer] Medicare    Authorization Time Period 09/29/15 to 11/10/15   Authorization - Visit Number 3   Authorization - Number of Visits 10   PT Start Time 1437   PT Stop Time 1515   PT Time Calculation (min) 38 min   Activity Tolerance Patient tolerated treatment well   Behavior During Therapy [facility] for tasks assessed/performed      Past Medical History  Diagnosis Date  . Hypertension   . Dyslipidemia   . IBS (irritable bowel syndrome)     With loose stool  . Arthritis   . Macular degeneration disease     Past Surgical History  Procedure Laterality Date  . Back surgeries  x    x 3  . Knee cartilage surgery    . Toe surgery    . Elbow surgery    . Shoulder surgery      rotator cuff repair (rt)  . Tonsillectomy    . Yag laser application Right 05/29/2012    Procedure: YAG LASER APPLICATION;  Surgeon: Susa Simmondsarroll F Haines, MD;  Location: [facility];  Service: Ophthalmology;  Laterality: Right;  . Total knee arthroplasty Left 09/03/2015    Procedure: TOTAL KNEE ARTHROPLASTY;  Surgeon: Vickki HearingStanley E Harrison, MD;  Location: [facility];  Service: Orthopedics;  Laterality: Left;    There were no vitals filed for this visit.      Subjective Assessment - 10/13/15 1513    Subjective Pt states he is doing better each day.  STates the pain is not as bad just wish he could get back to driving and walking without AD   Currently in Pain? No/denies            [facility] PT Assessment - 10/13/15 0001    Assessment   Medical Diagnosis L TKR    AROM   Left  Knee Extension 12   Left Knee Flexion 75                     [facility] Adult PT Treatment/Exercise - 10/13/15 0001    Knee/Hip Exercises: Stretches   Active Hamstring Stretch 3 reps;30 seconds   Active Hamstring Stretch Limitations 12" box   Knee: Self-Stretch to increase Flexion 3 reps;30 seconds   Knee: Self-Stretch Limitations 12" box   Knee/Hip Exercises: Supine   Quad Sets 2 sets;10 reps   Heel Slides 15 reps   Straight Leg Raises Left;10 reps   Knee Extension PROM;3 sets   Knee Flexion PROM   Knee Flexion Limitations 75 degrees   Manual Therapy   Manual Therapy Myofascial release;Passive ROM   Manual therapy comments Manual complete separate rest of treatment    Myofascial Release to reduce adhesions anterior and posterior knee   Passive ROM extension and flexion                  PT Short Term Goals - 09/29/15 1742    PT SHORT TERM GOAL #1   Title Patient will demonstrate L knee ROM  of 8-100 degrees in order to improve mechanics and reduce discomfort   Time 3   Period Weeks   Status New   PT SHORT TERM GOAL #2   Title Patient to be able to ambulate unlimited distances with SPC with pain L knee no more than 2/10 in order to improve mobility and QOL    Time 3   Period Weeks   Status New   PT SHORT TERM GOAL #3   Title Patient to be independent in correctly and consistently applying ice, performing scar massage, and regular skin care of L knee in order to assist in improving condition    Time 3   Period Weeks   Status New   PT SHORT TERM GOAL #4   Title Patient to be independent in correctly and consistently performign appropriate HEP, to be updated PRN    Time 3   Period Weeks   Status New           PT Long Term Goals - 09/29/15 1744    PT LONG TERM GOAL #1   Title Patient to demonstrate L knee ROM 0-110 degrees in order to improve mechanics and  general gait and mobility   Time 6   Period Weeks   Status New   PT LONG TERM GOAL #2    Title Patient to be able to ambulate unlimited distances with no assistive device, correct mechanics including full heel-toe pattern, equal step/stance lengths/times, upright posture in order to allow him to access community    Time 6   Period Weeks   Status New   PT LONG TERM GOAL #3   Title Patient to be able to reciprocally ascend/descent full flight of stairs with U railing, minimal unsteadiness, good eccentric control in order to allow him to access the community    Time 6   Period Weeks   Status New   PT LONG TERM GOAL #4   Title Patient to report he has been able to retun to regular exercise program, at least 30 minutes in duration, at least 4 days per week, in order to [facility] functional gains and improve overall health status    Time 6   Period Weeks   Status New               Plan - 10/13/15 1515    Clinical Impression Statement Focused on ROM of Lt knee this session.  Added standing HS and knee flexion stretches and continued established therex.  Completed myofascial techniques to Lt knee with majority of focus on anterior aspect.  Able to achieve 75 degrees AROM afterward.  Encouraged pateint to continue to push ROM and complete massage techniques as well.   Rehab Potential Good   PT Frequency 3x / week   PT Duration 6 weeks   PT Treatment/Interventions ADLs/Self Care Home Management;Cryotherapy;Moist Heat;DME Instruction;Gait training;Stair training;Functional mobility training;Therapeutic activities;Therapeutic exercise;Balance training;Neuromuscular re-education;Patient/family education;Manual techniques;Scar mobilization;Passive range of motion;Energy conservation;Taping   PT Next Visit Plan focus on ROM, gait, edema; progress to strength as ROM improves.  Begin ambulation using cane next session and begin knee hang with manual to posterior knee.       Patient will benefit from skilled therapeutic intervention in order to improve the following deficits and  impairments:  Abnormal gait, Decreased skin integrity, Hypomobility, Decreased scar mobility, Increased edema, Decreased activity tolerance, Decreased strength, Pain, Decreased balance, Decreased mobility, Difficulty walking, Decreased range of motion, Improper body mechanics, Decreased coordination, Impaired flexibility, Postural dysfunction  Visit Diagnosis: Stiffness of left knee, not elsewhere classified  Localized edema  Muscle weakness (generalized)  Difficulty in walking, not elsewhere classified     Problem List Patient Active Problem List   Diagnosis Date Noted  . Essential hypertension 09/08/2015  . Macular degeneration 09/08/2015  . IBS (irritable bowel syndrome) 09/08/2015  . Dyslipidemia 09/08/2015  . Primary osteoarthritis of left knee   . ACHILLES TENDINITIS 10/02/2009  . RUPTURE ROTATOR CUFF 03/25/2009  . IMPINGEMENT SYNDROME 03/06/2009    Lurena Nida, PTA/CLT [phone]  10/13/2015, 3:19 PM  [facility] [address] [place], [place], [address] Phone: [phone]   Fax:  [phone]  Name: Christopher Warner MRN: [id number] Date of Birth: [birth year]

## 2015-10-15 ENCOUNTER — Ambulatory Visit (HOSPITAL_COMMUNITY): Payer: Medicare Other

## 2015-10-15 DIAGNOSIS — M6281 Muscle weakness (generalized): Secondary | ICD-10-CM

## 2015-10-15 DIAGNOSIS — R262 Difficulty in walking, not elsewhere classified: Secondary | ICD-10-CM

## 2015-10-15 DIAGNOSIS — R600 Localized edema: Secondary | ICD-10-CM | POA: Diagnosis not present

## 2015-10-15 DIAGNOSIS — M25662 Stiffness of left knee, not elsewhere classified: Secondary | ICD-10-CM

## 2015-10-15 NOTE — Patient Instructions (Signed)
Knee Extension Mobilization: Hang (Prone)    With table supporting thighs with knee and leg hanging off bed. Hold initially for 4-5 minutes and increase as tolerated up to 20 minutes. Repeat 2 times per day every day.   http://orth.exer.us/722   Copyright  VHI. All rights reserved.

## 2015-10-15 NOTE — Therapy (Signed)
[facility] [address] [place], [place], [address] Phone: [phone]   Fax:  [phone]  Physical Therapy Treatment  Patient Details  Name: Christopher Warner H Renda MRN: [id number] Date of Birth: Apr 16, 1927 Referring Provider: [name]  Encounter Date: 10/15/2015      PT End of Session - 10/15/15 1704    Visit Number 4   Number of Visits 18   Date for PT Re-Evaluation 10/20/15   Authorization Type [employer] Medicare    Authorization Time Period 09/29/15 to 11/10/15   Authorization - Visit Number 4   Authorization - Number of Visits 10   PT Start Time 1652   PT Stop Time 1735   PT Time Calculation (min) 43 min   Activity Tolerance Patient tolerated treatment well   Behavior During Therapy [facility] for tasks assessed/performed      Past Medical History  Diagnosis Date  . Hypertension   . Dyslipidemia   . IBS (irritable bowel syndrome)     With loose stool  . Arthritis   . Macular degeneration disease     Past Surgical History  Procedure Laterality Date  . Back surgeries  x    x 3  . Knee cartilage surgery    . Toe surgery    . Elbow surgery    . Shoulder surgery      rotator cuff repair (rt)  . Tonsillectomy    . Yag laser application Right 05/29/2012    Procedure: YAG LASER APPLICATION;  Surgeon: Susa Simmondsarroll F Haines, MD;  Location: [facility];  Service: Ophthalmology;  Laterality: Right;  . Total knee arthroplasty Left 09/03/2015    Procedure: TOTAL KNEE ARTHROPLASTY;  Surgeon: Vickki HearingStanley E Harrison, MD;  Location: [facility];  Service: Orthopedics;  Laterality: Left;    There were no vitals filed for this visit.      Subjective Assessment - 10/15/15 1658    Subjective Pt stated knee is feeling good today, reports complaince with HEP 4x daily.  Reports going to grocery store yesterday, very tiring.   Pertinent History HTN, macular degeneration, history of several back surgeries    Patient Stated Goals get back to PLOF    Currently in  Pain? No/denies            [facility] PT Assessment - 10/15/15 0001    Assessment   Medical Diagnosis L TKR    Referring Provider [name]   Onset Date/Surgical Date 09/03/15   Next MD Visit Romeo AppleHarrison 10/30/2015   AROM   Left Knee Extension 9   Left Knee Flexion 78            [facility] Adult PT Treatment/Exercise - 10/15/15 0001    Knee/Hip Exercises: Stretches   Active Hamstring Stretch 3 reps;30 seconds   Active Hamstring Stretch Limitations supine with rope   Quad Stretch Left;3 reps;30 seconds   Knee: Self-Stretch to increase Flexion 10 seconds   Knee: Self-Stretch Limitations 10x 10" on 12 step   Knee/Hip Exercises: Supine   Quad Sets 2 sets;10 reps   Short Arc Quad Sets 15 reps   Heel Slides 15 reps   Knee Extension PROM;5 sets   Knee Flexion PROM;5 sets   Knee/Hip Exercises: Prone   Prone Knee Hang 4 minutes   Prone Knee Hang Limitations manual STM to posterior knee   Manual Therapy   Manual Therapy Edema management;Joint mobilization;Soft tissue mobilization;Passive ROM   Manual therapy comments Manual complete separate rest of treatment  Edema Management Retro massage with LE elevated   Joint Mobilization patella mobs all directions, fib/tib A/P   Soft tissue mobilization quadriceps   Myofascial Release to reduce adhesions anterior and posterior knee   Passive ROM extension and flexion             PT Short Term Goals - 09/29/15 1742    PT SHORT TERM GOAL #1   Title Patient will demonstrate L knee ROM of 8-100 degrees in order to improve mechanics and reduce discomfort   Time 3   Period Weeks   Status New   PT SHORT TERM GOAL #2   Title Patient to be able to ambulate unlimited distances with SPC with pain L knee no more than 2/10 in order to improve mobility and QOL    Time 3   Period Weeks   Status New   PT SHORT TERM GOAL #3   Title Patient to be independent in correctly and consistently applying ice, performing scar massage, and regular skin  care of L knee in order to assist in improving condition    Time 3   Period Weeks   Status New   PT SHORT TERM GOAL #4   Title Patient to be independent in correctly and consistently performign appropriate HEP, to be updated PRN    Time 3   Period Weeks   Status New           PT Long Term Goals - 09/29/15 1744    PT LONG TERM GOAL #1   Title Patient to demonstrate L knee ROM 0-110 degrees in order to improve mechanics and  general gait and mobility   Time 6   Period Weeks   Status New   PT LONG TERM GOAL #2   Title Patient to be able to ambulate unlimited distances with no assistive device, correct mechanics including full heel-toe pattern, equal step/stance lengths/times, upright posture in order to allow him to access community    Time 6   Period Weeks   Status New   PT LONG TERM GOAL #3   Title Patient to be able to reciprocally ascend/descent full flight of stairs with U railing, minimal unsteadiness, good eccentric control in order to allow him to access the community    Time 6   Period Weeks   Status New   PT LONG TERM GOAL #4   Title Patient to report he has been able to retun to regular exercise program, at least 30 minutes in duration, at least 4 days per week, in order to [facility] functional gains and improve overall health status    Time 6   Period Weeks   Status New               Plan - 10/15/15 1728    Clinical Impression Statement Continued session focus on improving ROM of Lt knee for flexion and extension.  Manual technqiues complete including retro massage for edema control, joint mobs to improve mobilty, scar tissue massage to reduce adhesions and PROM to improve range per pt tolerance. Knee continues to have mobility restrictions with hard end feel with PROM flexion.   Added prone knee hang with manual to hamstrings and HEP printout given to add at home.  ROM is improving slowly with AROM 9-78 degrees at EOS.     Rehab Potential Good   PT Frequency  3x / week   PT Duration 6 weeks   PT Treatment/Interventions ADLs/Self Care Home Management;Cryotherapy;Moist Heat;DME Instruction;Gait training;Stair  training;Functional mobility training;Therapeutic activities;Therapeutic exercise;Balance training;Neuromuscular re-education;Patient/family education;Manual techniques;Scar mobilization;Passive range of motion;Energy conservation;Taping   PT Next Visit Plan focus on ROM, gait, edema; progress to strength as ROM improves.    PT Home Exercise Plan added prone knee hang HEP      Patient will benefit from skilled therapeutic intervention in order to improve the following deficits and impairments:  Abnormal gait, Decreased skin integrity, Hypomobility, Decreased scar mobility, Increased edema, Decreased activity tolerance, Decreased strength, Pain, Decreased balance, Decreased mobility, Difficulty walking, Decreased range of motion, Improper body mechanics, Decreased coordination, Impaired flexibility, Postural dysfunction  Visit Diagnosis: Stiffness of left knee, not elsewhere classified  Localized edema  Muscle weakness (generalized)  Difficulty in walking, not elsewhere classified     Problem List Patient Active Problem List   Diagnosis Date Noted  . Essential hypertension 09/08/2015  . Macular degeneration 09/08/2015  . IBS (irritable bowel syndrome) 09/08/2015  . Dyslipidemia 09/08/2015  . Primary osteoarthritis of left knee   . ACHILLES TENDINITIS 10/02/2009  . RUPTURE ROTATOR CUFF 03/25/2009  . IMPINGEMENT SYNDROME 03/06/2009   Becky Sax, LPTA; CBIS [phone]  Juel Burrow 10/15/2015, 5:44 PM  [facility] [address] [place], [place], [address] Phone: [phone]   Fax:  [phone]  Name: MEZIAH BLASINGAME MRN: [id number] Date of Birth: 03-Dec-1927

## 2015-10-17 ENCOUNTER — Ambulatory Visit (HOSPITAL_COMMUNITY): Payer: Medicare Other | Admitting: Physical Therapy

## 2015-10-17 DIAGNOSIS — M25662 Stiffness of left knee, not elsewhere classified: Secondary | ICD-10-CM

## 2015-10-17 DIAGNOSIS — R262 Difficulty in walking, not elsewhere classified: Secondary | ICD-10-CM

## 2015-10-17 DIAGNOSIS — M6281 Muscle weakness (generalized): Secondary | ICD-10-CM

## 2015-10-17 DIAGNOSIS — R600 Localized edema: Secondary | ICD-10-CM | POA: Diagnosis not present

## 2015-10-17 NOTE — Therapy (Signed)
[facility] [address] [place], [place], [address] Phone: [phone]   Fax:  [phone]  Physical Therapy Treatment  Patient Details  Name: Christopher Warner MRN: [id number] Date of Birth: 12-Oct-1927 Referring Provider: Fuller Canada  Encounter Date: 10/17/2015      PT End of Session - 10/17/15 0930    Visit Number 5   Number of Visits 18   Date for PT Re-Evaluation 10/20/15   Authorization Type [employer] Medicare    Authorization Time Period 09/29/15 to 11/10/15   Authorization - Visit Number 5   Authorization - Number of Visits 10   PT Start Time 0900   PT Stop Time 0941   PT Time Calculation (min) 41 min   Activity Tolerance Patient tolerated treatment well   Behavior During Therapy [facility] for tasks assessed/performed      Past Medical History  Diagnosis Date  . Hypertension   . Dyslipidemia   . IBS (irritable bowel syndrome)     With loose stool  . Arthritis   . Macular degeneration disease     Past Surgical History  Procedure Laterality Date  . Back surgeries  x    x 3  . Knee cartilage surgery    . Toe surgery    . Elbow surgery    . Shoulder surgery      rotator cuff repair (rt)  . Tonsillectomy    . Yag laser application Right 05/29/2012    Procedure: YAG LASER APPLICATION;  Surgeon: Susa Simmonds, MD;  Location: [facility];  Service: Ophthalmology;  Laterality: Right;  . Total knee arthroplasty Left 09/03/2015    Procedure: TOTAL KNEE ARTHROPLASTY;  Surgeon: Vickki Hearing, MD;  Location: [facility];  Service: Orthopedics;  Laterality: Left;    There were no vitals filed for this visit.      Subjective Assessment - 10/17/15 0904    Subjective Pt states his knee is doing good. He has been working on trying to improve his ROM several times a day. He feels that if the swelling could go down, he would be doing ok.    Pertinent History HTN, macular degeneration, history of several back surgeries    Patient Stated  Goals get back to PLOF    Currently in Pain? No/denies                         [facility] Adult PT Treatment/Exercise - 10/17/15 0001    Knee/Hip Exercises: Standing   Other Standing Knee Exercises Retrostepping x30 reps at counter with RLE back   Knee/Hip Exercises: Seated   Knee/Hip Flexion Lt knee flexion stretch x1 min (demo for HEP)   Knee/Hip Exercises: Supine   Heel Slides Left;AAROM;10 reps  x10 sec hold   Manual Therapy   Manual Therapy Joint mobilization;Soft tissue mobilization   Manual therapy comments Manual complete separate rest of treatment    Joint Mobilization S<>I Lt patella mobs; grade III-IV Lt tibiofemoral jt mobs    Soft tissue mobilization distal quad/scar                PT Education - 10/17/15 0951    Education provided Yes   Education Details reviewed/updated HEP; discussed importance of knee ext/flex for functional mobility   Person(s) Educated Patient   Methods Explanation;Demonstration;Handout   Comprehension Verbalized understanding;Returned demonstration          PT Short Term Goals - 09/29/15 1742  PT SHORT TERM GOAL #1   Title Patient will demonstrate L knee ROM of 8-100 degrees in order to improve mechanics and reduce discomfort   Time 3   Period Weeks   Status New   PT SHORT TERM GOAL #2   Title Patient to be able to ambulate unlimited distances with SPC with pain L knee no more than 2/10 in order to improve mobility and QOL    Time 3   Period Weeks   Status New   PT SHORT TERM GOAL #3   Title Patient to be independent in correctly and consistently applying ice, performing scar massage, and regular skin care of L knee in order to assist in improving condition    Time 3   Period Weeks   Status New   PT SHORT TERM GOAL #4   Title Patient to be independent in correctly and consistently performign appropriate HEP, to be updated PRN    Time 3   Period Weeks   Status New           PT Long Term Goals -  09/29/15 1744    PT LONG TERM GOAL #1   Title Patient to demonstrate L knee ROM 0-110 degrees in order to improve mechanics and  general gait and mobility   Time 6   Period Weeks   Status New   PT LONG TERM GOAL #2   Title Patient to be able to ambulate unlimited distances with no assistive device, correct mechanics including full heel-toe pattern, equal step/stance lengths/times, upright posture in order to allow him to access community    Time 6   Period Weeks   Status New   PT LONG TERM GOAL #3   Title Patient to be able to reciprocally ascend/descent full flight of stairs with U railing, minimal unsteadiness, good eccentric control in order to allow him to access the community    Time 6   Period Weeks   Status New   PT LONG TERM GOAL #4   Title Patient to report he has been able to retun to regular exercise program, at least 30 minutes in duration, at least 4 days per week, in order to [facility] functional gains and improve overall health status    Time 6   Period Weeks   Status New               Plan - 10/17/15 0954    Clinical Impression Statement Today's session focused on knee flexion and extension ROM via manual techniques and therex. Encouraged pt to increase time spent on knee ROM at home and reviewed technique with several exercises. He reported no increased pain by the end of the session.    Rehab Potential Good   PT Frequency 3x / week   PT Duration 6 weeks   PT Treatment/Interventions ADLs/Self Care Home Management;Cryotherapy;Moist Heat;DME Instruction;Gait training;Stair training;Functional mobility training;Therapeutic activities;Therapeutic exercise;Balance training;Neuromuscular re-education;Patient/family education;Manual techniques;Scar mobilization;Passive range of motion;Energy conservation;Taping   PT Next Visit Plan focus on ROM, gait, edema; progress to strength as ROM improves.    PT Home Exercise Plan ROM: seated knee ext stretch with/without weight  for up to 30 min, knee flexion ROM with strap, seated knee flexion stretch   Recommended Other Services none    Consulted and Agree with Plan of Care Patient      Patient will benefit from skilled therapeutic intervention in order to improve the following deficits and impairments:  Abnormal gait, Decreased skin integrity, Hypomobility, Decreased scar  mobility, Increased edema, Decreased activity tolerance, Decreased strength, Pain, Decreased balance, Decreased mobility, Difficulty walking, Decreased range of motion, Improper body mechanics, Decreased coordination, Impaired flexibility, Postural dysfunction  Visit Diagnosis: Stiffness of left knee, not elsewhere classified  Localized edema  Muscle weakness (generalized)  Difficulty in walking, not elsewhere classified     Problem List Patient Active Problem List   Diagnosis Date Noted  . Essential hypertension 09/08/2015  . Macular degeneration 09/08/2015  . IBS (irritable bowel syndrome) 09/08/2015  . Dyslipidemia 09/08/2015  . Primary osteoarthritis of left knee   . ACHILLES TENDINITIS 10/02/2009  . RUPTURE ROTATOR CUFF 03/25/2009  . IMPINGEMENT SYNDROME 03/06/2009   9:59 AM,10/17/2015 Marylyn IshiharaSara Kiser PT, DPT [facility] [phone]  [facility] [address] , [place], [address] Phone: [phone]   Fax:  [phone]  Name: Christopher Warner MRN: [id number] Date of Birth: [birth year]

## 2015-10-20 ENCOUNTER — Ambulatory Visit (HOSPITAL_COMMUNITY): Payer: Medicare Other | Admitting: Physical Therapy

## 2015-10-20 DIAGNOSIS — M25662 Stiffness of left knee, not elsewhere classified: Secondary | ICD-10-CM | POA: Diagnosis not present

## 2015-10-20 DIAGNOSIS — R262 Difficulty in walking, not elsewhere classified: Secondary | ICD-10-CM | POA: Diagnosis not present

## 2015-10-20 DIAGNOSIS — R600 Localized edema: Secondary | ICD-10-CM | POA: Diagnosis not present

## 2015-10-20 DIAGNOSIS — M6281 Muscle weakness (generalized): Secondary | ICD-10-CM | POA: Diagnosis not present

## 2015-10-20 NOTE — Therapy (Signed)
[place], [place], [address] Phone: [phone]   Fax:  [phone]  Physical Therapy Treatment/Reassessment  Patient Details  Name: Christopher Warner MRN: [id number] Date of Birth: [birth year] Referring Provider: Arther Abbott, MD  Encounter Date: 10/20/2015      PT End of Session - 10/20/15 1339    Visit Number 6   Number of Visits 18   Date for PT Re-Evaluation 11/10/15   Authorization Type [employer] Medicare (G-codes done 6th visit)   Authorization Time Period 09/29/15 to 11/10/15   Authorization - Visit Number 6   Authorization - Number of Visits 10   PT Start Time 5465  Pt not checked in   PT Stop Time 1345   PT Time Calculation (min) 34 min   Activity Tolerance Patient tolerated treatment well   Behavior During Therapy [facility] for tasks assessed/performed      Past Medical History:  Diagnosis Date  . Arthritis   . Dyslipidemia   . Hypertension   . IBS (irritable bowel syndrome)    With loose stool  . Macular degeneration disease     Past Surgical History:  Procedure Laterality Date  . back surgeries  x   x 3  . ELBOW SURGERY    . KNEE CARTILAGE SURGERY    . SHOULDER SURGERY     rotator cuff repair (rt)  . TOE SURGERY    . TONSILLECTOMY    . TOTAL KNEE ARTHROPLASTY Left 09/03/2015   Procedure: TOTAL KNEE ARTHROPLASTY;  Surgeon: Carole Civil, MD;  Location: [facility];  Service: Orthopedics;  Laterality: Left;  . YAG LASER APPLICATION Right 09/03/1273   Procedure: YAG LASER APPLICATION;  Surgeon: Williams Che, MD;  Location: [facility];  Service: Ophthalmology;  Laterality: Right;    There were no vitals filed for this visit.      Subjective Assessment - 10/20/15 1313    Subjective Pt states that he has been doing well. He has minimal report of pain. He has been doing some of his exercises. He went to church Sunday without an AD and didn't have any trouble.    Pertinent History HTN, macular  degeneration, history of several back surgeries    Patient Stated Goals get back to PLOF    Currently in Pain? No/denies            [facility] PT Assessment - 10/20/15 0001      Assessment   Medical Diagnosis L TKR    Referring Provider Arther Abbott, MD   Onset Date/Surgical Date 09/03/15   Next MD Visit 10/30/15     Prior Function   Level of Independence Independent;Independent with basic ADLs;Independent with gait;Independent with transfers   Vocation Retired   Leisure grows plants for [employer]      Observation/Other Assessments   Skin Integrity incision appears well healing at this imte with no acute signs of inflammation or infection   Focus on Therapeutic Outcomes (FOTO)  67% limited      Posture/Postural Control   Posture/Postural Control Postural limitations   Postural Limitations Rounded Shoulders;Forward head;Flexed trunk;Weight shift right     AROM   Left Knee Extension 10   Left Knee Flexion 85     Strength   Right Hip Flexion 5/5   Right Hip Extension 2/5   Right Hip ABduction 4/5   Left Hip Flexion 5/5   Left Hip Extension 2/5   Left Hip ABduction 4+/5   Right  Knee Flexion 5/5   Right Knee Extension 5/5   Left Knee Flexion 5/5  within available ROM; seated   Left Knee Extension 5/5  within available ROM    Right Ankle Dorsiflexion 5/5   Left Ankle Dorsiflexion 5/5     Ambulation/Gait   Gait Comments flexed posture, reduced motion of L knee/reduced L knee TKE and flexion, ER L hip, rigid trunka nd pelvis      6 minute walk test results    Aerobic Endurance Distance Walked 618   Endurance additional comments 3MWT without AD     High Level Balance   High Level Balance Comments TUG 10.95 wihout AD                     [facility] Adult PT Treatment/Exercise - 10/20/15 0001      Knee/Hip Exercises: Supine   Quad Sets AAROM;Left;2 sets;10 reps  PT over pressure x10 sec hold    Heel Slides Left;AAROM;1 set;10 reps  10 sec hold       Manual Therapy   Manual Therapy Muscle Energy Technique   Manual therapy comments Manual complete separate rest of treatment    Muscle Energy Technique hamstring contract relax with therapist over pressure into knee extension x10 reps  Scar massage x1 min                PT Education - 10/20/15 1542    Education provided Yes   Education Details discussed goals/POC; importance of gaining knee flexion/ext for improved mobility; reviewed and encouraged full HEP adherence   Person(s) Educated Patient   Methods Explanation;Demonstration   Comprehension Verbalized understanding;Returned demonstration;Need further instruction          PT Short Term Goals - 10/20/15 1334      PT SHORT TERM GOAL #1   Title Patient will demonstrate L knee ROM of 8-100 degrees in order to improve mechanics and reduce discomfort   Baseline 10-85   Time 3   Period Weeks   Status Partially Met     PT SHORT TERM GOAL #2   Title Patient to be able to ambulate unlimited distances with SPC with pain L knee no more than 2/10 in order to improve mobility and QOL    Baseline pt reports he is able to walk in [employer] and [employer] with SPC amd without any pain   Time 3   Period Weeks   Status Achieved     PT SHORT TERM GOAL #3   Title Patient to be independent in correctly and consistently applying ice, performing scar massage, and regular skin care of L knee in order to assist in improving condition    Time 3   Period Weeks   Status On-going     PT SHORT TERM GOAL #4   Title Patient to be independent in correctly and consistently performign appropriate HEP, to be updated PRN    Time 3   Period Weeks   Status On-going           PT Long Term Goals - 10/20/15 1335      PT LONG TERM GOAL #1   Title Patient to demonstrate L knee ROM 0-110 degrees in order to improve mechanics and  general gait and mobility   Time 6   Period Weeks   Status Not Met     PT LONG TERM GOAL #2   Title Patient to be  able to ambulate unlimited distances with no assistive device, correct  mechanics including full heel-toe pattern, equal step/stance lengths/times, upright posture in order to allow him to access community    Baseline decreased stance time, knee flexion noted during mid stance    Time 6   Period Weeks   Status Partially Met     PT LONG TERM GOAL #3   Title Patient to be able to reciprocally ascend/descent full flight of stairs with U railing, minimal unsteadiness, good eccentric control in order to allow him to access the community    Time 6   Period Weeks   Status Unable to assess     PT LONG TERM GOAL #4   Title Patient to report he has been able to retun to regular exercise program, at least 30 minutes in duration, at least 4 days per week, in order to [facility] functional gains and improve overall health status    Baseline only doing upper body strengthening right now   Time 6   Period Weeks   Status Partially Met               Plan - October 24, 2015 1343    Clinical Impression Statement Christopher Warner was reassessed this visit having met several goals and showing progression towards others. He demonstrates improved mobility and performance on functional testing without an AD. He continues to have significant limitations in Lt knee ROM, 10-85 deg approximately, along with swelling, and decreased scar/joint mobility which is impairing his functional mobility, balance and safety at home and in the community. I reviewed several exercises from his HEP and encouraged continued adherence. Will continue with current POC.    Rehab Potential Good   PT Frequency 3x / week   PT Duration 6 weeks   PT Treatment/Interventions ADLs/Self Care Home Management;Cryotherapy;Moist Heat;DME Instruction;Gait training;Stair training;Functional mobility training;Therapeutic activities;Therapeutic exercise;Balance training;Neuromuscular re-education;Patient/family education;Manual techniques;Scar mobilization;Passive  range of motion;Energy conservation;Taping   PT Next Visit Plan manual techniques to improve knee ext/flexion ROM; fibula and patella mobility; addition of gastroc strength next visit   PT Home Exercise Plan ROM: seated knee ext stretch with/without weight for up to 30 min, knee flexion ROM with strap, seated knee flexion stretch   Recommended Other Services none    Consulted and Agree with Plan of Care Patient      Patient will benefit from skilled therapeutic intervention in order to improve the following deficits and impairments:  Abnormal gait, Decreased skin integrity, Hypomobility, Decreased scar mobility, Increased edema, Decreased activity tolerance, Decreased strength, Pain, Decreased balance, Decreased mobility, Difficulty walking, Decreased range of motion, Improper body mechanics, Decreased coordination, Impaired flexibility, Postural dysfunction  Visit Diagnosis: Stiffness of left knee, not elsewhere classified  Localized edema  Muscle weakness (generalized)  Difficulty in walking, not elsewhere classified       G-Codes - October 24, 2015 1546    Functional Assessment Tool Used clinical judgement based on assessment of ROM, strength and functional mobility   Functional Limitation Mobility: Walking and moving around   Mobility: Walking and Moving Around Current Status ([id number]) At least 40 percent but less than 60 percent impaired, limited or restricted   Mobility: Walking and Moving Around Goal Status [phone]) At least 40 percent but less than 60 percent impaired, limited or restricted      Problem List Patient Active Problem List   Diagnosis Date Noted  . Essential hypertension 09/08/2015  . Macular degeneration 09/08/2015  . IBS (irritable bowel syndrome) 09/08/2015  . Dyslipidemia 09/08/2015  . Primary osteoarthritis of left knee   . ACHILLES TENDINITIS  10/02/2009  . RUPTURE ROTATOR CUFF 03/25/2009  . IMPINGEMENT SYNDROME 03/06/2009    3:49 PM,10/20/15 Elly Modena PT,  DPT [facility] [place] [address] [place], [place], [address] Phone: [phone]   Fax:  [phone]  Name: Christopher Warner MRN: [id number] Date of Birth: 05-30-1927

## 2015-10-22 ENCOUNTER — Ambulatory Visit (HOSPITAL_COMMUNITY): Payer: Medicare Other | Admitting: Physical Therapy

## 2015-10-22 DIAGNOSIS — M25662 Stiffness of left knee, not elsewhere classified: Secondary | ICD-10-CM

## 2015-10-22 DIAGNOSIS — R262 Difficulty in walking, not elsewhere classified: Secondary | ICD-10-CM

## 2015-10-22 DIAGNOSIS — M6281 Muscle weakness (generalized): Secondary | ICD-10-CM

## 2015-10-22 DIAGNOSIS — R600 Localized edema: Secondary | ICD-10-CM

## 2015-10-22 NOTE — Therapy (Signed)
[facility] [address] [place], [place], [address] Phone: [phone]   Fax:  [phone]  Physical Therapy Treatment  Patient Details  Name: Christopher Warner MRN: [id number] Date of Birth: 1928/02/28 Referring Provider: Arther Abbott, MD  Encounter Date: 10/22/2015      PT End of Session - 10/22/15 1644    Visit Number 7   Number of Visits 18   Date for PT Re-Evaluation 11/10/15   Authorization Type [employer] Medicare (G-codes done 6th visit)   Authorization Time Period 09/29/15 to 11/10/15   Authorization - Visit Number 7   Authorization - Number of Visits 16   PT Start Time 1602   PT Stop Time 1643   PT Time Calculation (min) 41 min   Activity Tolerance Patient tolerated treatment well   Behavior During Therapy [facility] for tasks assessed/performed      Past Medical History:  Diagnosis Date  . Arthritis   . Dyslipidemia   . Hypertension   . IBS (irritable bowel syndrome)    With loose stool  . Macular degeneration disease     Past Surgical History:  Procedure Laterality Date  . back surgeries  x   x 3  . ELBOW SURGERY    . KNEE CARTILAGE SURGERY    . SHOULDER SURGERY     rotator cuff repair (rt)  . TOE SURGERY    . TONSILLECTOMY    . TOTAL KNEE ARTHROPLASTY Left 09/03/2015   Procedure: TOTAL KNEE ARTHROPLASTY;  Surgeon: Carole Civil, MD;  Location: [facility];  Service: Orthopedics;  Laterality: Left;  . YAG LASER APPLICATION Right 09/01/4401   Procedure: YAG LASER APPLICATION;  Surgeon: Williams Che, MD;  Location: [facility];  Service: Ophthalmology;  Laterality: Right;    There were no vitals filed for this visit.      Subjective Assessment - 10/22/15 1605    Subjective Patient reports he is doing well, no pain today and he was walking in [place] and felt he was able to bend his knee more than he had been. No falls or close calls recently.    Pertinent History HTN, macular degeneration, history of several back  surgeries    Patient Stated Goals get back to PLOF    Currently in Pain? No/denies                         [facility] Adult PT Treatment/Exercise - 10/22/15 0001      Knee/Hip Exercises: Stretches   Active Hamstring Stretch 3 reps;30 seconds   Active Hamstring Stretch Limitations 12 inch box    Knee: Self-Stretch to increase Flexion Other (comment)  10 reps, 10 second holds    Gastroc Stretch Both;3 reps;30 seconds   Gastroc Stretch Limitations gastroc on slantboard      Knee/Hip Exercises: Standing   Heel Raises Both;1 set;20 reps   Heel Raises Limitations heel and toe    Rocker Board 2 minutes   Rocker Board Limitations AP and lateral, U HHA      Knee/Hip Exercises: Seated   Hamstring Curl AAROM;Left;1 set;15 reps   Hamstring Limitations 10 second holds      Knee/Hip Exercises: Supine   Quad Sets Left;1 set;15 reps     Manual Therapy   Manual Therapy Joint mobilization;Soft tissue mobilization   Manual therapy comments Manual complete separate rest of treatment    Joint Mobilization patella all directions, tib on femur grade 3 5x30 seconds,  fib on tib 3x20 seconds   Soft tissue mobilization scar mobilization all directions                 PT Education - 10/22/15 1644    Education provided No          PT Short Term Goals - 10/20/15 1334      PT SHORT TERM GOAL #1   Title Patient will demonstrate L knee ROM of 8-100 degrees in order to improve mechanics and reduce discomfort   Baseline 10-85   Time 3   Period Weeks   Status Partially Met     PT SHORT TERM GOAL #2   Title Patient to be able to ambulate unlimited distances with SPC with pain L knee no more than 2/10 in order to improve mobility and QOL    Baseline pt reports he is able to walk in [employer] and [employer] with SPC amd without any pain   Time 3   Period Weeks   Status Achieved     PT SHORT TERM GOAL #3   Title Patient to be independent in correctly and consistently applying ice,  performing scar massage, and regular skin care of L knee in order to assist in improving condition    Time 3   Period Weeks   Status On-going     PT SHORT TERM GOAL #4   Title Patient to be independent in correctly and consistently performign appropriate HEP, to be updated PRN    Time 3   Period Weeks   Status On-going           PT Long Term Goals - 10/20/15 1335      PT LONG TERM GOAL #1   Title Patient to demonstrate L knee ROM 0-110 degrees in order to improve mechanics and  general gait and mobility   Time 6   Period Weeks   Status Not Met     PT LONG TERM GOAL #2   Title Patient to be able to ambulate unlimited distances with no assistive device, correct mechanics including full heel-toe pattern, equal step/stance lengths/times, upright posture in order to allow him to access community    Baseline decreased stance time, knee flexion noted during mid stance    Time 6   Period Weeks   Status Partially Met     PT LONG TERM GOAL #3   Title Patient to be able to reciprocally ascend/descent full flight of stairs with U railing, minimal unsteadiness, good eccentric control in order to allow him to access the community    Time 6   Period Weeks   Status Unable to assess     PT LONG TERM GOAL #4   Title Patient to report he has been able to retun to regular exercise program, at least 30 minutes in duration, at least 4 days per week, in order to [facility] functional gains and improve overall health status    Baseline only doing upper body strengthening right now   Time 6   Period Weeks   Status Partially Met               Plan - 10/22/15 1645    Clinical Impression Statement Continued with functional stretching, extensive manual to L knee area, and general functional activities aimed at improving ROM in L knee. Patient appeared to tolerate session quite well today, with no major complaints of pain and only intermittent cues for form this session. Scar appears well  healing and patient  appears compliant with scar/knee care. Introduced Production manager today as well as a couple additional pre-gait/balance tasks in standing with good performance and tolerance.    Rehab Potential Good   PT Frequency 3x / week   PT Duration 6 weeks   PT Treatment/Interventions ADLs/Self Care Home Management;Cryotherapy;Moist Heat;DME Instruction;Gait training;Stair training;Functional mobility training;Therapeutic activities;Therapeutic exercise;Balance training;Neuromuscular re-education;Patient/family education;Manual techniques;Scar mobilization;Passive range of motion;Energy conservation;Taping   PT Next Visit Plan manual techniques to improve knee ext/flexion ROM; fibula and patella mobility; continue gastroc strength/pre-gait/introduce more balance next visit   Consulted and Agree with Plan of Care Patient      Patient will benefit from skilled therapeutic intervention in order to improve the following deficits and impairments:  Abnormal gait, Decreased skin integrity, Hypomobility, Decreased scar mobility, Increased edema, Decreased activity tolerance, Decreased strength, Pain, Decreased balance, Decreased mobility, Difficulty walking, Decreased range of motion, Improper body mechanics, Decreased coordination, Impaired flexibility, Postural dysfunction  Visit Diagnosis: Stiffness of left knee, not elsewhere classified  Localized edema  Muscle weakness (generalized)  Difficulty in walking, not elsewhere classified     Problem List Patient Active Problem List   Diagnosis Date Noted  . Essential hypertension 09/08/2015  . Macular degeneration 09/08/2015  . IBS (irritable bowel syndrome) 09/08/2015  . Dyslipidemia 09/08/2015  . Primary osteoarthritis of left knee   . ACHILLES TENDINITIS 10/02/2009  . RUPTURE ROTATOR CUFF 03/25/2009  . IMPINGEMENT SYNDROME 03/06/2009    Deniece Ree PT, DPT [facility] [address] [place], [place], [address] Phone: [phone]   Fax:  [phone]  Name: GENTRY PILSON MRN: [id number] Date of Birth: November 26, 1927

## 2015-10-24 ENCOUNTER — Ambulatory Visit (HOSPITAL_COMMUNITY): Payer: Medicare Other

## 2015-10-24 DIAGNOSIS — R600 Localized edema: Secondary | ICD-10-CM

## 2015-10-24 DIAGNOSIS — R262 Difficulty in walking, not elsewhere classified: Secondary | ICD-10-CM

## 2015-10-24 DIAGNOSIS — M25662 Stiffness of left knee, not elsewhere classified: Secondary | ICD-10-CM | POA: Diagnosis not present

## 2015-10-24 DIAGNOSIS — M6281 Muscle weakness (generalized): Secondary | ICD-10-CM

## 2015-10-24 NOTE — Therapy (Signed)
[facility] [address] [place], [place], [address] Phone: [phone]   Fax:  [phone]  Physical Therapy Treatment  Patient Details  Name: Christopher Warner MRN: [id number] Date of Birth: Sep 15, 1927 Referring Provider: Arther Abbott, MD  Encounter Date: 10/24/2015      PT End of Session - 10/24/15 1016    Visit Number 8   Number of Visits 18   Date for PT Re-Evaluation 11/10/15   Authorization Type [employer] Medicare (G-codes done 6th visit)   Authorization Time Period 09/29/15 to 11/10/15   Authorization - Visit Number 8   Authorization - Number of Visits 16   PT Start Time 5035   PT Stop Time 1033   PT Time Calculation (min) 45 min   Activity Tolerance Patient tolerated treatment well   Behavior During Therapy [facility] for tasks assessed/performed      Past Medical History:  Diagnosis Date  . Arthritis   . Dyslipidemia   . Hypertension   . IBS (irritable bowel syndrome)    With loose stool  . Macular degeneration disease     Past Surgical History:  Procedure Laterality Date  . back surgeries  x   x 3  . ELBOW SURGERY    . KNEE CARTILAGE SURGERY    . SHOULDER SURGERY     rotator cuff repair (rt)  . TOE SURGERY    . TONSILLECTOMY    . TOTAL KNEE ARTHROPLASTY Left 09/03/2015   Procedure: TOTAL KNEE ARTHROPLASTY;  Surgeon: Carole Civil, MD;  Location: [facility];  Service: Orthopedics;  Laterality: Left;  . YAG LASER APPLICATION Right 07/03/5679   Procedure: YAG LASER APPLICATION;  Surgeon: Williams Che, MD;  Location: [facility];  Service: Ophthalmology;  Laterality: Right;    There were no vitals filed for this visit.      Subjective Assessment - 10/24/15 1020    Subjective Pt stated knee is a little sore today, pain scale 3/10 Lt knee.  Reports compliance wiht HEP daily.   Pertinent History HTN, macular degeneration, history of several back surgeries    Patient Stated Goals get back to PLOF    Currently in Pain? Yes    Pain Score 3    Pain Location Knee   Pain Orientation Left   Pain Descriptors / Indicators Tightness;Sore   Pain Type Surgical pain   Pain Radiating Towards none   Pain Onset 1 to 4 weeks ago   Pain Frequency Intermittent   Aggravating Factors  movement   Pain Relieving Factors rest   Effect of Pain on Daily Activities cannot get back to PLOF activities            [facility] Adult PT Treatment/Exercise - 10/24/15 0001      Knee/Hip Exercises: Stretches   Active Hamstring Stretch 3 reps;30 seconds   Active Hamstring Stretch Limitations 12 inch box    Knee: Self-Stretch Limitations knee drives 27N 10" holds on 8in step   Gastroc Stretch Both;3 reps;30 seconds   Gastroc Stretch Limitations slant board     Knee/Hip Exercises: Standing   Heel Raises Both;1 set;20 reps   Heel Raises Limitations heel and toe    SLS Lt 25", Rt 40' max of 2     Knee/Hip Exercises: Supine   Quad Sets Left;1 set;20 reps   Short Arc Quad Sets 2 sets;15 reps   Heel Slides AAROM;Left;15 reps   Knee Extension PROM   Knee Flexion PROM  Manual Therapy   Manual Therapy Joint mobilization;Myofascial release;Soft tissue mobilization;Passive ROM   Manual therapy comments Manual complete separate rest of treatment    Joint Mobilization patella all directions, tib on femur grade 3 5x30 seconds, fib on tib 3x20 seconds   Soft tissue mobilization quadriceps   Myofascial Release scar tissue massage distal quad/incision   Passive ROM flexion and extension                   PT Short Term Goals - 10/20/15 1334      PT SHORT TERM GOAL #1   Title Patient will demonstrate L knee ROM of 8-100 degrees in order to improve mechanics and reduce discomfort   Baseline 10-85   Time 3   Period Weeks   Status Partially Met     PT SHORT TERM GOAL #2   Title Patient to be able to ambulate unlimited distances with SPC with pain L knee no more than 2/10 in order to improve mobility and QOL    Baseline pt  reports he is able to walk in [employer] with SPC amd without any pain   Time 3   Period Weeks   Status Achieved     PT SHORT TERM GOAL #3   Title Patient to be independent in correctly and consistently applying ice, performing scar massage, and regular skin care of L knee in order to assist in improving condition    Time 3   Period Weeks   Status On-going     PT SHORT TERM GOAL #4   Title Patient to be independent in correctly and consistently performign appropriate HEP, to be updated PRN    Time 3   Period Weeks   Status On-going           PT Long Term Goals - 10/20/15 1335      PT LONG TERM GOAL #1   Title Patient to demonstrate L knee ROM 0-110 degrees in order to improve mechanics and  general gait and mobility   Time 6   Period Weeks   Status Not Met     PT LONG TERM GOAL #2   Title Patient to be able to ambulate unlimited distances with no assistive device, correct mechanics including full heel-toe pattern, equal step/stance lengths/times, upright posture in order to allow him to access community    Baseline decreased stance time, knee flexion noted during mid stance    Time 6   Period Weeks   Status Partially Met     PT LONG TERM GOAL #3   Title Patient to be able to reciprocally ascend/descent full flight of stairs with U railing, minimal unsteadiness, good eccentric control in order to allow him to access the community    Time 6   Period Weeks   Status Unable to assess     PT LONG TERM GOAL #4   Title Patient to report he has been able to retun to regular exercise program, at least 30 minutes in duration, at least 4 days per week, in order to [facility] functional gains and improve overall health status    Baseline only doing upper body strengthening right now   Time 6   Period Weeks   Status Partially Met               Plan - 10/24/15 1213    Clinical Impression Statement Continued session focus on improving ROM with manual techniques and  therex.  Pt continues to present  with fascial restricions distal quadricpes on incision.  Manual myofascial release techniques complete to reduce adhesions, soft tissue mobilization to quad and hamstrings to reduce tightness and PROM per pt tolerance.  Pt making small gains with ROM, able to complete 4-93 degrees following manual and therex today.  Added SLS to POC to improve knee stability.  No reports of pain through session.     Rehab Potential Good   PT Frequency 3x / week   PT Duration 6 weeks   PT Treatment/Interventions ADLs/Self Care Home Management;Cryotherapy;Moist Heat;DME Instruction;Gait training;Stair training;Functional mobility training;Therapeutic activities;Therapeutic exercise;Balance training;Neuromuscular re-education;Patient/family education;Manual techniques;Scar mobilization;Passive range of motion;Energy conservation;Taping   PT Next Visit Plan manual techniques to improve knee ext/flexion ROM; fibula and patella mobility; continue gastroc strength/pre-gait/introduce more balance next visit   PT Home Exercise Plan Reviewed compliance with HEP, no additional exercises given this session.  ROM: seated knee ext stretch with/without weight for up to 30 min, knee flexion ROM with strap, seated knee flexion stretch      Patient will benefit from skilled therapeutic intervention in order to improve the following deficits and impairments:  Abnormal gait, Decreased skin integrity, Hypomobility, Decreased scar mobility, Increased edema, Decreased activity tolerance, Decreased strength, Pain, Decreased balance, Decreased mobility, Difficulty walking, Decreased range of motion, Improper body mechanics, Decreased coordination, Impaired flexibility, Postural dysfunction  Visit Diagnosis: Stiffness of left knee, not elsewhere classified  Localized edema  Muscle weakness (generalized)  Difficulty in walking, not elsewhere classified     Problem List Patient Active Problem List    Diagnosis Date Noted  . Essential hypertension 09/08/2015  . Macular degeneration 09/08/2015  . IBS (irritable bowel syndrome) 09/08/2015  . Dyslipidemia 09/08/2015  . Primary osteoarthritis of left knee   . ACHILLES TENDINITIS 10/02/2009  . RUPTURE ROTATOR CUFF 03/25/2009  . IMPINGEMENT SYNDROME 03/06/2009   Ihor Austin, LPTA; [place]  Aldona Lento 10/24/2015, 12:17 PM  [facility] [place], [place], [address] Phone: [phone]   Fax:  [phone]  Name: NELLIE CHEVALIER MRN: [id number] Date of Birth: 12-22-1927

## 2015-10-27 ENCOUNTER — Ambulatory Visit (HOSPITAL_COMMUNITY): Payer: Medicare Other | Admitting: Physical Therapy

## 2015-10-27 DIAGNOSIS — M25662 Stiffness of left knee, not elsewhere classified: Secondary | ICD-10-CM

## 2015-10-27 DIAGNOSIS — R262 Difficulty in walking, not elsewhere classified: Secondary | ICD-10-CM

## 2015-10-27 DIAGNOSIS — M6281 Muscle weakness (generalized): Secondary | ICD-10-CM

## 2015-10-27 DIAGNOSIS — R600 Localized edema: Secondary | ICD-10-CM

## 2015-10-27 NOTE — Therapy (Signed)
Christopher Warner, [place], [address] Phone: [phone]   Fax:  [phone]  Physical Therapy Treatment  Patient Details  Name: Christopher Warner MRN: [id number] Date of Birth: Jan 08, 1928 Referring Provider: Arther Abbott, MD  Encounter Date: 10/27/2015      PT End of Session - 10/27/15 0943    Visit Number 9   Number of Visits 18   Date for PT Re-Evaluation 11/10/15   Authorization Type [employer] Medicare (G-codes done 6th visit)   Authorization Time Period 09/29/15 to 11/10/15   Authorization - Visit Number 9   Authorization - Number of Visits 16   PT Start Time 9528   PT Stop Time 0943   PT Time Calculation (min) 44 min   Activity Tolerance Patient tolerated treatment well   Behavior During Therapy [facility] for tasks assessed/performed      Past Medical History:  Diagnosis Date  . Arthritis   . Dyslipidemia   . Hypertension   . IBS (irritable bowel syndrome)    With loose stool  . Macular degeneration disease     Past Surgical History:  Procedure Laterality Date  . back surgeries  x   x 3  . ELBOW SURGERY    . KNEE CARTILAGE SURGERY    . SHOULDER SURGERY     rotator cuff repair (rt)  . TOE SURGERY    . TONSILLECTOMY    . TOTAL KNEE ARTHROPLASTY Left 09/03/2015   Procedure: TOTAL KNEE ARTHROPLASTY;  Surgeon: Carole Civil, MD;  Location: [facility];  Service: Orthopedics;  Laterality: Left;  . YAG LASER APPLICATION Right 06/28/3242   Procedure: YAG LASER APPLICATION;  Surgeon: Williams Che, MD;  Location: [facility];  Service: Ophthalmology;  Laterality: Right;    There were no vitals filed for this visit.      Subjective Assessment - 10/27/15 0900    Subjective Patient arrives today stating that he is doing well, no pain and had a good weekend with no falls    Pertinent History HTN, macular degeneration, history of several back surgeries    Currently in Pain? No/denies                          [facility] Adult PT Treatment/Exercise - 10/27/15 0001      Knee/Hip Exercises: Stretches   Active Hamstring Stretch 3 reps;30 seconds   Active Hamstring Stretch Limitations 12 inch box    Knee: Self-Stretch Limitations knee drives 01U 10" holds on 12in step   Gastroc Stretch Both;3 reps;30 seconds   Gastroc Stretch Limitations slant board     Knee/Hip Exercises: Standing   Heel Raises Both;1 set;20 reps   Heel Raises Limitations heel and toe    Rocker Board 2 minutes   Rocker Board Limitations lateral,  U HHA      Knee/Hip Exercises: Seated   Hamstring Curl AAROM;Left;1 set;15 reps   Hamstring Limitations 5 seconds holds      Knee/Hip Exercises: Supine   Quad Sets Left;1 set;20 reps   Quad Sets Limitations 3 second holds    Short Arc [employer] 2 sets;15 reps   Short Arc Quad Sets Limitations 3 second holds    Knee Extension PROM     Manual Therapy   Manual Therapy Joint mobilization;Myofascial release;Soft tissue mobilization;Passive ROM   Manual therapy comments Manual complete separate rest of treatment    Joint Mobilization patella all directions, tib on femur  grade 3 5x30 seconds, fib on tib 3x20 seconds   Soft tissue mobilization quadriceps             Balance Exercises - 10/27/15 0934      Balance Exercises: Standing   Tandem Stance Eyes open;Foam/compliant surface;1 rep   SLS Eyes open;3 reps;Foam/compliant surface           PT Education - 10/27/15 0943    Education provided No          PT Short Term Goals - 10/20/15 1334      PT SHORT TERM GOAL #1   Title Patient will demonstrate L knee ROM of 8-100 degrees in order to improve mechanics and reduce discomfort   Baseline 10-85   Time 3   Period Weeks   Status Partially Met     PT SHORT TERM GOAL #2   Title Patient to be able to ambulate unlimited distances with SPC with pain L knee no more than 2/10 in order to improve mobility and QOL    Baseline pt  reports he is able to walk in [employer] with SPC amd without any pain   Time 3   Period Weeks   Status Achieved     PT SHORT TERM GOAL #3   Title Patient to be independent in correctly and consistently applying ice, performing scar massage, and regular skin care of L knee in order to assist in improving condition    Time 3   Period Weeks   Status On-going     PT SHORT TERM GOAL #4   Title Patient to be independent in correctly and consistently performign appropriate HEP, to be updated PRN    Time 3   Period Weeks   Status On-going           PT Long Term Goals - 10/20/15 1335      PT LONG TERM GOAL #1   Title Patient to demonstrate L knee ROM 0-110 degrees in order to improve mechanics and  general gait and mobility   Time 6   Period Weeks   Status Not Met     PT LONG TERM GOAL #2   Title Patient to be able to ambulate unlimited distances with no assistive device, correct mechanics including full heel-toe pattern, equal step/stance lengths/times, upright posture in order to allow him to access community    Baseline decreased stance time, knee flexion noted during mid stance    Time 6   Period Weeks   Status Partially Met     PT LONG TERM GOAL #3   Title Patient to be able to reciprocally ascend/descent full flight of stairs with U railing, minimal unsteadiness, good eccentric control in order to allow him to access the community    Time 6   Period Weeks   Status Unable to assess     PT LONG TERM GOAL #4   Title Patient to report he has been able to retun to regular exercise program, at least 30 minutes in duration, at least 4 days per week, in order to [facility] functional gains and improve overall health status    Baseline only doing upper body strengthening right now   Time 6   Period Weeks   Status Partially Met               Plan - 10/27/15 0944    Clinical Impression Statement Continued with functional stretching and manual/exercises to  promote knee ROM today, also continued with  extension of balance training at this time. Patient appears to be doing well with good tolerance of all tasks/activities today however does continue to experience surgical knee stiffness. Continue to note some knotting present in L quad however this appears improved since last session, patient reports he has been working on this at home as well.    Rehab Potential Good   PT Frequency 3x / week   PT Duration 6 weeks   PT Treatment/Interventions ADLs/Self Care Home Management;Cryotherapy;Moist Heat;DME Instruction;Gait training;Stair training;Functional mobility training;Therapeutic activities;Therapeutic exercise;Balance training;Neuromuscular re-education;Patient/family education;Manual techniques;Scar mobilization;Passive range of motion;Energy conservation;Taping   PT Next Visit Plan manual techniques to improve knee ext/flexion ROM; fibula and patella mobility; continue gastroc strength/pre-gait/introduce more balance next visit   PT Home Exercise Plan no updates    Consulted and Agree with Plan of Care Patient      Patient will benefit from skilled therapeutic intervention in order to improve the following deficits and impairments:  Abnormal gait, Decreased skin integrity, Hypomobility, Decreased scar mobility, Increased edema, Decreased activity tolerance, Decreased strength, Pain, Decreased balance, Decreased mobility, Difficulty walking, Decreased range of motion, Improper body mechanics, Decreased coordination, Impaired flexibility, Postural dysfunction  Visit Diagnosis: Stiffness of left knee, not elsewhere classified  Localized edema  Muscle weakness (generalized)  Difficulty in walking, not elsewhere classified     Problem List Patient Active Problem List   Diagnosis Date Noted  . Essential hypertension 09/08/2015  . Macular degeneration 09/08/2015  . IBS (irritable bowel syndrome) 09/08/2015  . Dyslipidemia 09/08/2015  . Primary  osteoarthritis of left knee   . ACHILLES TENDINITIS 10/02/2009  . RUPTURE ROTATOR CUFF 03/25/2009  . IMPINGEMENT SYNDROME 03/06/2009    Deniece Ree PT, DPT [facility] [address] [place], [place], [address] Phone: [phone]   Fax:  [phone]  Name: Christopher Warner MRN: [id number] Date of Birth: [birth year]

## 2015-10-29 ENCOUNTER — Ambulatory Visit (HOSPITAL_COMMUNITY): Payer: Medicare Other | Attending: Internal Medicine

## 2015-10-29 DIAGNOSIS — M25662 Stiffness of left knee, not elsewhere classified: Secondary | ICD-10-CM | POA: Diagnosis not present

## 2015-10-29 DIAGNOSIS — M6281 Muscle weakness (generalized): Secondary | ICD-10-CM | POA: Insufficient documentation

## 2015-10-29 DIAGNOSIS — R262 Difficulty in walking, not elsewhere classified: Secondary | ICD-10-CM | POA: Insufficient documentation

## 2015-10-29 DIAGNOSIS — R600 Localized edema: Secondary | ICD-10-CM | POA: Insufficient documentation

## 2015-10-29 NOTE — Therapy (Signed)
[facility] [address] [place], [place], [address] Phone: [phone]   Fax:  [phone]  Physical Therapy Treatment  Patient Details  Name: Christopher Warner MRN: [id number] Date of Birth: 07/23/1927 Referring Provider: Arther Abbott  Encounter Date: 10/29/2015      PT End of Session - 10/29/15 0822    Visit Number 10   Number of Visits 18   Date for PT Re-Evaluation 11/10/15   Authorization Type [employer] Medicare (G-codes done 6th visit)   Authorization Time Period 09/29/15 to 11/10/15   Authorization - Visit Number 10   Authorization - Number of Visits 16   PT Start Time 0816   PT Stop Time 0908   PT Time Calculation (min) 52 min   Activity Tolerance Patient tolerated treatment well   Behavior During Therapy [facility] for tasks assessed/performed      Past Medical History:  Diagnosis Date  . Arthritis   . Dyslipidemia   . Hypertension   . IBS (irritable bowel syndrome)    With loose stool  . Macular degeneration disease     Past Surgical History:  Procedure Laterality Date  . back surgeries  x   x 3  . ELBOW SURGERY    . KNEE CARTILAGE SURGERY    . SHOULDER SURGERY     rotator cuff repair (rt)  . TOE SURGERY    . TONSILLECTOMY    . TOTAL KNEE ARTHROPLASTY Left 09/03/2015   Procedure: TOTAL KNEE ARTHROPLASTY;  Surgeon: Carole Civil, MD;  Location: [facility];  Service: Orthopedics;  Laterality: Left;  . YAG LASER APPLICATION Right 0/11/9831   Procedure: YAG LASER APPLICATION;  Surgeon: Williams Che, MD;  Location: [facility];  Service: Ophthalmology;  Laterality: Right;    There were no vitals filed for this visit.      Subjective Assessment - 10/29/15 0822    Subjective Pt stated his knee is feeling good today, no reports of pain.  Reports compliance wiht HEP dailiy.   Pertinent History HTN, macular degeneration, history of several back surgeries    Patient Stated Goals get back to PLOF    Currently in Pain? No/denies             [facility] PT Assessment - 10/29/15 0001      Assessment   Medical Diagnosis L TKR    Referring Provider Arther Abbott   Onset Date/Surgical Date 09/03/15   Next MD Visit Aline Brochure 10/30/2015     AROM   Left Knee Extension 6  was 10   Left Knee Flexion 98  was 78                     [facility] Adult PT Treatment/Exercise - 10/29/15 0001      Knee/Hip Exercises: Stretches   Active Hamstring Stretch 3 reps;30 seconds   Active Hamstring Stretch Limitations supine with rope   Quad Stretch Left;3 reps;30 seconds   Quad Stretch Limitations prone with rope   Knee: Self-Stretch Limitations knee drives 82N 10" holds on 12in step   Gastroc Stretch Both;3 reps;30 seconds   Gastroc Stretch Limitations slant board     Knee/Hip Exercises: Aerobic   Stationary Bike Backwards x 12mn seat 14 for ROM at EOS unsupervised no charge     Knee/Hip Exercises: Standing   Heel Raises Both;1 set;20 reps   Heel Raises Limitations heel and toe      Knee/Hip Exercises: Supine   Quad Sets Left;1  set;20 reps   Short [employer] 2 sets;15 reps   Short [employer] Sets Limitations 3 second holds   Heel Slides AAROM;Left;15 reps   Knee Extension PROM   Knee Flexion PROM     Manual Therapy   Manual Therapy Joint mobilization;Myofascial release;Soft tissue mobilization;Passive ROM   Manual therapy comments Manual complete separate rest of treatment    Joint Mobilization patella all directions, tib on femur grade 3 5x30 seconds, fib on tib 3x20 seconds   Soft tissue mobilization quadriceps and hamstring   Myofascial Release scar tissue massage distal quad/incision   Passive ROM flexion and extension                   PT Short Term Goals - 10/29/15 0836      PT SHORT TERM GOAL #1   Title Patient will demonstrate L knee ROM of 8-100 degrees in order to improve mechanics and reduce discomfort   Baseline 10/29/2015:  AROM 6-98 degrees   Status Partially Met     PT  SHORT TERM GOAL #2   Title Patient to be able to ambulate unlimited distances with SPC with pain L knee no more than 2/10 in order to improve mobility and QOL    Baseline 10/29/2015:  Ambulates with no AD for 35-40 minutes with no pain   Status Achieved     PT SHORT TERM GOAL #3   Title Patient to be independent in correctly and consistently applying ice, performing scar massage, and regular skin care of L knee in order to assist in improving condition    Baseline 10/29/2015:  Reports has been completeing manual techniques, ice application for pain and edema    Status Achieved     PT SHORT TERM GOAL #4   Title Patient to be independent in correctly and consistently performign appropriate HEP, to be updated PRN    Baseline 10/29/2015:  Reports compliance wiht HEP daily   Status Achieved           PT Long Term Goals - 10/29/15 0839      PT LONG TERM GOAL #1   Title Patient to demonstrate L knee ROM 0-110 degrees in order to improve mechanics and  general gait and mobility   Status Not Met     PT LONG TERM GOAL #2   Title Patient to be able to ambulate unlimited distances with no assistive device, correct mechanics including full heel-toe pattern, equal step/stance lengths/times, upright posture in order to allow him to access community    Baseline 10/29/2015: decreased stance time, knee flexion noted during mid stance    Status On-going     PT LONG TERM GOAL #3   Title Patient to be able to reciprocally ascend/descent full flight of stairs with U railing, minimal unsteadiness, good eccentric control in order to allow him to access the community    Baseline 10/29/2015:  Step to pattern   Status Not Met     PT LONG TERM GOAL #4   Title Patient to report he has been able to retun to regular exercise program, at least 30 minutes in duration, at least 4 days per week, in order to [facility] functional gains and improve overall health status    Baseline 10/29/2015:  Reports going to gym  2-3 days a week working on upper body strength   Status Partially Met               Plan - 10/29/15 0831  Clinical Impression Statement Session focus on improving ROM with therex and manual techniques to improve joint mobility and decrease tightness on distal quadriceps/fascial restrictrions over scar.  Pt tolerated well to session with no reports of pain and good form/technique wiht majority of exercises.  Improved ROM following manual and functional stretches to 6-98 degrees.  Reviewed goals prior MD apt scheduled with 3/4 STGs met and ongoing LTGs.  Pt will continue to benefit from skilled intervention to address ROM restrictions, strengthening and improve balance to reduce risk of injury.   Rehab Potential Good   PT Frequency 3x / week   PT Duration 6 weeks   PT Treatment/Interventions ADLs/Self Care Home Management;Cryotherapy;Moist Heat;DME Instruction;Gait training;Stair training;Functional mobility training;Therapeutic activities;Therapeutic exercise;Balance training;Neuromuscular re-education;Patient/family education;Manual techniques;Scar mobilization;Passive range of motion;Energy conservation;Taping   PT Next Visit Plan manual techniques to improve knee ext/flexion ROM; fibula and patella mobility; continue gastroc strength/pre-gait/introduce more balance next visit      Patient will benefit from skilled therapeutic intervention in order to improve the following deficits and impairments:  Abnormal gait, Decreased skin integrity, Hypomobility, Decreased scar mobility, Increased edema, Decreased activity tolerance, Decreased strength, Pain, Decreased balance, Decreased mobility, Difficulty walking, Decreased range of motion, Improper body mechanics, Decreased coordination, Impaired flexibility, Postural dysfunction  Visit Diagnosis: Stiffness of left knee, not elsewhere classified  Localized edema  Muscle weakness (generalized)  Difficulty in walking, not elsewhere  classified     Problem List Patient Active Problem List   Diagnosis Date Noted  . Essential hypertension 09/08/2015  . Macular degeneration 09/08/2015  . IBS (irritable bowel syndrome) 09/08/2015  . Dyslipidemia 09/08/2015  . Primary osteoarthritis of left knee   . ACHILLES TENDINITIS 10/02/2009  . RUPTURE ROTATOR CUFF 03/25/2009  . IMPINGEMENT SYNDROME 03/06/2009   Ihor Austin, LPTA; [place]  Aldona Lento 10/29/2015, 1:56 PM  [facility] [address] [place], [place], [address] Phone: [phone]   Fax:  [phone]  Name: GUAGE EFFERSON MRN: [id number] Date of Birth: 04/10/1927

## 2015-10-30 ENCOUNTER — Encounter: Payer: Self-pay | Admitting: Orthopedic Surgery

## 2015-10-30 ENCOUNTER — Ambulatory Visit: Payer: Medicare Other | Admitting: Orthopedic Surgery

## 2015-10-30 VITALS — BP 153/81 | HR 61 | Ht 71.0 in | Wt 170.0 lb

## 2015-10-30 DIAGNOSIS — Z96652 Presence of left artificial knee joint: Secondary | ICD-10-CM

## 2015-10-30 DIAGNOSIS — Z4789 Encounter for other orthopedic aftercare: Secondary | ICD-10-CM

## 2015-10-30 NOTE — Progress Notes (Signed)
Status post left total knee on June 7  Patient is doing well minimal discomfort range of motion is 8-90  Continue physical therapy no signs of infection. Patient is walking without a cane or follow-up one month

## 2015-10-31 ENCOUNTER — Ambulatory Visit (HOSPITAL_COMMUNITY): Payer: Medicare Other | Admitting: Physical Therapy

## 2015-10-31 DIAGNOSIS — R600 Localized edema: Secondary | ICD-10-CM

## 2015-10-31 DIAGNOSIS — M25662 Stiffness of left knee, not elsewhere classified: Secondary | ICD-10-CM

## 2015-10-31 DIAGNOSIS — R262 Difficulty in walking, not elsewhere classified: Secondary | ICD-10-CM | POA: Diagnosis not present

## 2015-10-31 DIAGNOSIS — M6281 Muscle weakness (generalized): Secondary | ICD-10-CM

## 2015-10-31 NOTE — Therapy (Signed)
[facility] [address] [place], [place], [address] Phone: [phone]   Fax:  [phone]  Physical Therapy Treatment  Patient Details  Name: Christopher Warner MRN: [id number] Date of Birth: June 24, 1927 Referring Provider: Arther Abbott  Encounter Date: 10/31/2015      PT End of Session - 10/31/15 0857    Visit Number 11   Number of Visits 18   Date for PT Re-Evaluation 11/10/15   Authorization Type [employer] Medicare (G-codes done 6th visit)   Authorization Time Period 09/29/15 to 11/10/15   Authorization - Visit Number 11   Authorization - Number of Visits 16   PT Start Time 0817   PT Stop Time 0859   PT Time Calculation (min) 42 min   Equipment Utilized During Treatment Gait belt   Activity Tolerance Patient tolerated treatment well   Behavior During Therapy [facility] for tasks assessed/performed      Past Medical History:  Diagnosis Date  . Arthritis   . Dyslipidemia   . Hypertension   . IBS (irritable bowel syndrome)    With loose stool  . Macular degeneration disease     Past Surgical History:  Procedure Laterality Date  . back surgeries  x   x 3  . ELBOW SURGERY    . KNEE CARTILAGE SURGERY    . SHOULDER SURGERY     rotator cuff repair (rt)  . TOE SURGERY    . TONSILLECTOMY    . TOTAL KNEE ARTHROPLASTY Left 09/03/2015   Procedure: TOTAL KNEE ARTHROPLASTY;  Surgeon: Carole Civil, MD;  Location: [facility];  Service: Orthopedics;  Laterality: Left;  . YAG LASER APPLICATION Right 06/29/5954   Procedure: YAG LASER APPLICATION;  Surgeon: Williams Che, MD;  Location: [facility];  Service: Ophthalmology;  Laterality: Right;    There were no vitals filed for this visit.      Subjective Assessment - 10/31/15 0829    Subjective Pt reports some soreness today after working hard this past Wednesday. He continues to note swelling in the knee.   Pertinent History HTN, macular degeneration, history of several back surgeries    Patient Stated Goals get back to PLOF    Currently in Pain? Yes  Lt knee jt    Pain Score 2                          [facility] Adult PT Treatment/Exercise - 10/31/15 0001      Knee/Hip Exercises: Standing   Other Standing Knee Exercises Knee flexion on 12" step 15x10 sec     Knee/Hip Exercises: Seated   Knee/Hip Flexion Lt knee flexion stretch and hold 10x10sec     Knee/Hip Exercises: Supine   Heel Slides AAROM;Left;1 set;10 reps   Heel Slides Limitations 10 sec hold      Manual Therapy   Manual Therapy Joint mobilization;Edema management;Muscle Energy Technique   Manual therapy comments Manual complete separate rest of treatment    Joint Mobilization Posterior/medial rotation lt tibial glides at end range knee flexion   Soft tissue mobilization STM distal hamstring    Muscle Energy Technique Lt quad contract relax and stretch x5 reps             Balance Exercises - 10/31/15 0848      Balance Exercises: Standing   Tandem Stance Eyes open;Foam/compliant surface;2 reps;30 secs  foam L: 15, R: 25 max   SLS Eyes open;2 reps;20 secs  R: 13 sec, L: 20 sec           PT Education - 10/31/15 0855    Education provided Yes   Education Details encouraged HEP adherence to improve knee ROM; encouraged daily walking; encouraged pt to ice/elevate when he gets home    Person(s) Educated Patient   Methods Explanation   Comprehension Verbalized understanding          PT Short Term Goals - 10/29/15 0836      PT SHORT TERM GOAL #1   Title Patient will demonstrate L knee ROM of 8-100 degrees in order to improve mechanics and reduce discomfort   Baseline 10/29/2015:  AROM 6-98 degrees   Status Partially Met     PT SHORT TERM GOAL #2   Title Patient to be able to ambulate unlimited distances with SPC with pain L knee no more than 2/10 in order to improve mobility and QOL    Baseline 10/29/2015:  Ambulates with no AD for 35-40 minutes with no pain   Status  Achieved     PT SHORT TERM GOAL #3   Title Patient to be independent in correctly and consistently applying ice, performing scar massage, and regular skin care of L knee in order to assist in improving condition    Baseline 10/29/2015:  Reports has been completeing manual techniques, ice application for pain and edema    Status Achieved     PT SHORT TERM GOAL #4   Title Patient to be independent in correctly and consistently performign appropriate HEP, to be updated PRN    Baseline 10/29/2015:  Reports compliance wiht HEP daily   Status Achieved           PT Long Term Goals - 10/29/15 0839      PT LONG TERM GOAL #1   Title Patient to demonstrate L knee ROM 0-110 degrees in order to improve mechanics and  general gait and mobility   Status Not Met     PT LONG TERM GOAL #2   Title Patient to be able to ambulate unlimited distances with no assistive device, correct mechanics including full heel-toe pattern, equal step/stance lengths/times, upright posture in order to allow him to access community    Baseline 10/29/2015: decreased stance time, knee flexion noted during mid stance    Status On-going     PT LONG TERM GOAL #3   Title Patient to be able to reciprocally ascend/descent full flight of stairs with U railing, minimal unsteadiness, good eccentric control in order to allow him to access the community    Baseline 10/29/2015:  Step to pattern   Status Not Met     PT LONG TERM GOAL #4   Title Patient to report he has been able to retun to regular exercise program, at least 30 minutes in duration, at least 4 days per week, in order to [facility] functional gains and improve overall health status    Baseline 10/29/2015:  Reports going to gym 2-3 days a week working on upper body strength   Status Partially Met               Plan - 10/31/15 0857    Clinical Impression Statement Today's session focused on improving knee flexion ROM with therex and manual techniques. He  demonstrates soft tissue and joint restriction limiting primarily knee flexion. He performed well with static balance on firm surface, noting increased difficulty when foam surface was added to tandem/SLS. Did note some improvement in  flexion post treatment today, and I encouraged pt to increase adherence to HEP and walk several days a week at home. He verbalized understanding.   Rehab Potential Good   PT Frequency 3x / week   PT Duration 6 weeks   PT Treatment/Interventions ADLs/Self Care Home Management;Cryotherapy;Moist Heat;DME Instruction;Gait training;Stair training;Functional mobility training;Therapeutic activities;Therapeutic exercise;Balance training;Neuromuscular re-education;Patient/family education;Manual techniques;Scar mobilization;Passive range of motion;Energy conservation;Taping   PT Next Visit Plan manual techniques to improve knee ext/flexion ROM; fibula mobility; continue gastroc strength/pre-gait/introduce more balance next visit   PT Home Exercise Plan no updates    Consulted and Agree with Plan of Care Patient      Patient will benefit from skilled therapeutic intervention in order to improve the following deficits and impairments:  Abnormal gait, Decreased skin integrity, Hypomobility, Decreased scar mobility, Increased edema, Decreased activity tolerance, Decreased strength, Pain, Decreased balance, Decreased mobility, Difficulty walking, Decreased range of motion, Improper body mechanics, Decreased coordination, Impaired flexibility, Postural dysfunction  Visit Diagnosis: Stiffness of left knee, not elsewhere classified  Localized edema  Muscle weakness (generalized)  Difficulty in walking, not elsewhere classified     Problem List Patient Active Problem List   Diagnosis Date Noted  . Essential hypertension 09/08/2015  . Macular degeneration 09/08/2015  . IBS (irritable bowel syndrome) 09/08/2015  . Dyslipidemia 09/08/2015  . Primary osteoarthritis of left  knee   . ACHILLES TENDINITIS 10/02/2009  . RUPTURE ROTATOR CUFF 03/25/2009  . IMPINGEMENT SYNDROME 03/06/2009   9:14 AM,10/31/15 Elly Modena PT, DPT [facility] [address] [place], [place], [address] Phone: [phone]   Fax:  [phone]  Name: Christopher Warner MRN: [id number] Date of Birth: Aug 20, 1927

## 2015-11-03 ENCOUNTER — Ambulatory Visit (HOSPITAL_COMMUNITY): Payer: Medicare Other | Admitting: Physical Therapy

## 2015-11-03 DIAGNOSIS — R600 Localized edema: Secondary | ICD-10-CM | POA: Diagnosis not present

## 2015-11-03 DIAGNOSIS — M25662 Stiffness of left knee, not elsewhere classified: Secondary | ICD-10-CM

## 2015-11-03 DIAGNOSIS — M6281 Muscle weakness (generalized): Secondary | ICD-10-CM

## 2015-11-03 DIAGNOSIS — R262 Difficulty in walking, not elsewhere classified: Secondary | ICD-10-CM

## 2015-11-03 NOTE — Therapy (Signed)
[facility] [address] [place], [place], [address] Phone: [phone]   Fax:  [phone]  Physical Therapy Treatment  Patient Details  Name: Christopher Warner MRN: [id number] Date of Birth: 03/23/1928 Referring Provider: Fuller Canada  Encounter Date: 11/03/2015      PT End of Session - 11/03/15 0858    Visit Number 12   Number of Visits 18   Date for PT Re-Evaluation 11/10/15   Authorization Type [employer] Medicare (G-codes done 6th visit)   Authorization Time Period 09/29/15 to 11/10/15   Authorization - Visit Number 12   Authorization - Number of Visits 16   PT Start Time 0819   PT Stop Time 0857   PT Time Calculation (min) 38 min   Activity Tolerance Patient tolerated treatment well   Behavior During Therapy [facility] for tasks assessed/performed      Past Medical History:  Diagnosis Date  . Arthritis   . Dyslipidemia   . Hypertension   . IBS (irritable bowel syndrome)    With loose stool  . Macular degeneration disease     Past Surgical History:  Procedure Laterality Date  . back surgeries  x   x 3  . ELBOW SURGERY    . KNEE CARTILAGE SURGERY    . SHOULDER SURGERY     rotator cuff repair (rt)  . TOE SURGERY    . TONSILLECTOMY    . TOTAL KNEE ARTHROPLASTY Left 09/03/2015   Procedure: TOTAL KNEE ARTHROPLASTY;  Surgeon: Vickki Hearing, MD;  Location: [facility];  Service: Orthopedics;  Laterality: Left;  . YAG LASER APPLICATION Right 05/29/2012   Procedure: YAG LASER APPLICATION;  Surgeon: Susa Simmonds, MD;  Location: [facility];  Service: Ophthalmology;  Laterality: Right;    There were no vitals filed for this visit.      Subjective Assessment - 11/03/15 0821    Subjective Patient arrives today stating that he has been having quite a bit of pain since last session, it has stayed around 4-5/10 and has not let up. Nothing else major going on.    Pertinent History HTN, macular degeneration, history of several back surgeries     Currently in Pain? Yes   Pain Score 5    Pain Location Knee   Pain Orientation Left   Pain Descriptors / Indicators Discomfort;Sore   Pain Type Surgical pain   Pain Radiating Towards none    Pain Onset More than a month ago   Pain Frequency Constant   Aggravating Factors  not sure    Pain Relieving Factors nothing    Effect of Pain on Daily Activities nothing more than usual             [facility] PT Assessment - 11/03/15 0001      Observation/Other Assessments   Observations L knee appears to have increased edema, however no acute discoloration or other possible DVT  symptomology noted today                      [facility] Adult PT Treatment/Exercise - 11/03/15 0001      Knee/Hip Exercises: Stretches   Active Hamstring Stretch 3 reps;30 seconds   Active Hamstring Stretch Limitations 12 inch box    Knee: Self-Stretch Limitations knee drives 10x 10" holds on 12in step   Gastroc Stretch Both;3 reps;30 seconds   Gastroc Stretch Limitations slant board     Knee/Hip Exercises: Supine   Quad Sets Left;1 set;20 reps  Quad Sets Limitations 3 second holds    Knee Extension PROM   Knee Flexion PROM  done in prone      Manual Therapy   Manual Therapy Edema management   Manual therapy comments Manual complete separate rest of treatment    Edema Management retrograde massage L knee with LEs elevated              Balance Exercises - 11/03/15 0849      Balance Exercises: Standing   Standing Eyes Closed Narrow base of support (BOS);Foam/compliant surface;3 reps;20 secs   Tandem Stance Eyes open;3 reps   SLS Eyes open;Solid surface;4 reps           PT Education - 11/03/15 0857    Education provided Yes   Education Details remain active throughout the day, continue to ice/elevate knee    Person(s) Educated Patient   Methods Explanation   Comprehension Verbalized understanding          PT Short Term Goals - 10/29/15 0836      PT SHORT TERM GOAL #1    Title Patient will demonstrate L knee ROM of 8-100 degrees in order to improve mechanics and reduce discomfort   Baseline 10/29/2015:  AROM 6-98 degrees   Status Partially Met     PT SHORT TERM GOAL #2   Title Patient to be able to ambulate unlimited distances with SPC with pain L knee no more than 2/10 in order to improve mobility and QOL    Baseline 10/29/2015:  Ambulates with no AD for 35-40 minutes with no pain   Status Achieved     PT SHORT TERM GOAL #3   Title Patient to be independent in correctly and consistently applying ice, performing scar massage, and regular skin care of L knee in order to assist in improving condition    Baseline 10/29/2015:  Reports has been completeing manual techniques, ice application for pain and edema    Status Achieved     PT SHORT TERM GOAL #4   Title Patient to be independent in correctly and consistently performign appropriate HEP, to be updated PRN    Baseline 10/29/2015:  Reports compliance wiht HEP daily   Status Achieved           PT Long Term Goals - 10/29/15 0839      PT LONG TERM GOAL #1   Title Patient to demonstrate L knee ROM 0-110 degrees in order to improve mechanics and  general gait and mobility   Status Not Met     PT LONG TERM GOAL #2   Title Patient to be able to ambulate unlimited distances with no assistive device, correct mechanics including full heel-toe pattern, equal step/stance lengths/times, upright posture in order to allow him to access community    Baseline 10/29/2015: decreased stance time, knee flexion noted during mid stance    Status On-going     PT LONG TERM GOAL #3   Title Patient to be able to reciprocally ascend/descent full flight of stairs with U railing, minimal unsteadiness, good eccentric control in order to allow him to access the community    Baseline 10/29/2015:  Step to pattern   Status Not Met     PT LONG TERM GOAL #4   Title Patient to report he has been able to retun to regular exercise  program, at least 30 minutes in duration, at least 4 days per week, in order to [facility] functional gains and improve overall health status  Baseline 10/29/2015:  Reports going to gym 2-3 days a week working on upper body strength   Status Partially Met               Plan - 11/03/15 0858    Clinical Impression Statement Patient arrives today stating he has been having increased pain since his last session; it has not let up and not a whole lot has been making it feel better, he also feels like it is more swollen at this time. Continued with functional stretching however reduced intensity of manual to knee today due to ongoing increased pain, focused mainly on edema control with manual today. Otherwise continued with functional exercises based on increasing ROM, as well as balance tasks for remainder of session.    Rehab Potential Good   PT Frequency 3x / week   PT Duration 6 weeks   PT Treatment/Interventions ADLs/Self Care Home Management;Cryotherapy;Moist Heat;DME Instruction;Gait training;Stair training;Functional mobility training;Therapeutic activities;Therapeutic exercise;Balance training;Neuromuscular re-education;Patient/family education;Manual techniques;Scar mobilization;Passive range of motion;Energy conservation;Taping   PT Next Visit Plan manual techniques to improve knee ext/flexion ROM; fibula mobility; continue gastroc strength/pre-gait/introduce more balance next visit   PT Home Exercise Plan no updates    Consulted and Agree with Plan of Care Patient      Patient will benefit from skilled therapeutic intervention in order to improve the following deficits and impairments:  Abnormal gait, Decreased skin integrity, Hypomobility, Decreased scar mobility, Increased edema, Decreased activity tolerance, Decreased strength, Pain, Decreased balance, Decreased mobility, Difficulty walking, Decreased range of motion, Improper body mechanics, Decreased coordination, Impaired  flexibility, Postural dysfunction  Visit Diagnosis: Stiffness of left knee, not elsewhere classified  Localized edema  Muscle weakness (generalized)  Difficulty in walking, not elsewhere classified     Problem List Patient Active Problem List   Diagnosis Date Noted  . Essential hypertension 09/08/2015  . Macular degeneration 09/08/2015  . IBS (irritable bowel syndrome) 09/08/2015  . Dyslipidemia 09/08/2015  . Primary osteoarthritis of left knee   . ACHILLES TENDINITIS 10/02/2009  . RUPTURE ROTATOR CUFF 03/25/2009  . IMPINGEMENT SYNDROME 03/06/2009    Deniece Ree PT, DPT [facility] [address] [place], [place], [address] Phone: [phone]   Fax:  [phone]  Name: Christopher Warner MRN: [id number] Date of Birth: Sep 24, 1927

## 2015-11-05 ENCOUNTER — Ambulatory Visit (HOSPITAL_COMMUNITY): Payer: Medicare Other | Admitting: Physical Therapy

## 2015-11-05 DIAGNOSIS — R600 Localized edema: Secondary | ICD-10-CM | POA: Diagnosis not present

## 2015-11-05 DIAGNOSIS — R262 Difficulty in walking, not elsewhere classified: Secondary | ICD-10-CM | POA: Diagnosis not present

## 2015-11-05 DIAGNOSIS — M6281 Muscle weakness (generalized): Secondary | ICD-10-CM

## 2015-11-05 DIAGNOSIS — M25662 Stiffness of left knee, not elsewhere classified: Secondary | ICD-10-CM | POA: Diagnosis not present

## 2015-11-05 NOTE — Therapy (Signed)
[facility] [address] [place], [place], [address] Phone: [phone]   Fax:  [phone]  Physical Therapy Treatment  Patient Details  Name: Christopher Warner MRN: [id number] Date of Birth: [birth year] Referring Provider: Arther Abbott  Encounter Date: 11/05/2015      PT End of Session - 11/05/15 0914    Visit Number 12   Number of Visits 18   Date for PT Re-Evaluation 11/10/15   Authorization Type [employer] Medicare (G-codes done 6th visit)   Authorization Time Period 09/29/15 to 11/10/15   Authorization - Visit Number 12   Authorization - Number of Visits 16   PT Start Time 0818   PT Stop Time 0856   PT Time Calculation (min) 38 min   Equipment Utilized During Treatment Gait belt   Activity Tolerance Patient tolerated treatment well   Behavior During Therapy [facility] for tasks assessed/performed      Past Medical History:  Diagnosis Date  . Arthritis   . Dyslipidemia   . Hypertension   . IBS (irritable bowel syndrome)    With loose stool  . Macular degeneration disease     Past Surgical History:  Procedure Laterality Date  . back surgeries  x   x 3  . ELBOW SURGERY    . KNEE CARTILAGE SURGERY    . SHOULDER SURGERY     rotator cuff repair (rt)  . TOE SURGERY    . TONSILLECTOMY    . TOTAL KNEE ARTHROPLASTY Left 09/03/2015   Procedure: TOTAL KNEE ARTHROPLASTY;  Surgeon: Carole Civil, MD;  Location: [facility];  Service: Orthopedics;  Laterality: Left;  . YAG LASER APPLICATION Right 09/03/5914   Procedure: YAG LASER APPLICATION;  Surgeon: Williams Che, MD;  Location: [facility];  Service: Ophthalmology;  Laterality: Right;    There were no vitals filed for this visit.      Subjective Assessment - 11/05/15 0836    Subjective Pt states he's been sore in his Lt knee since last Wednesday when he was stretched into extension.  No real pain, just soreness.   Currently in Pain? No/denies                          [facility] Adult PT Treatment/Exercise - 11/05/15 0001      Knee/Hip Exercises: Stretches   Active Hamstring Stretch 3 reps;30 seconds   Active Hamstring Stretch Limitations 12 inch box    Knee: Self-Stretch Limitations knee drives 38G 10" holds on 12in step   Gastroc Stretch Both;3 reps;30 seconds   Gastroc Stretch Limitations slant board     Knee/Hip Exercises: Standing   Knee Flexion Left;10 reps   Knee Flexion Limitations TKF on 6" box     Knee/Hip Exercises: Supine   Quad Sets Left;20 reps   Quad Sets Limitations 3" holds prior to PROM   Heel Slides AAROM;Left;1 set;10 reps   Heel Slides Limitations 10 sec hold    Knee Extension PROM   Knee Flexion PROM     Manual Therapy   Manual Therapy Soft tissue mobilization;Myofascial release   Manual therapy comments Manual complete separate rest of treatment    Edema Management --   Soft tissue mobilization perimeter of scar and scar to decrease adhesions   Myofascial Release scar tissue massage distal quad/incision   Passive ROM flexion and extension  PT Short Term Goals - 10/29/15 0836      PT SHORT TERM GOAL #1   Title Patient will demonstrate L knee ROM of 8-100 degrees in order to improve mechanics and reduce discomfort   Baseline 10/29/2015:  AROM 6-98 degrees   Status Partially Met     PT SHORT TERM GOAL #2   Title Patient to be able to ambulate unlimited distances with SPC with pain L knee no more than 2/10 in order to improve mobility and QOL    Baseline 10/29/2015:  Ambulates with no AD for 35-40 minutes with no pain   Status Achieved     PT SHORT TERM GOAL #3   Title Patient to be independent in correctly and consistently applying ice, performing scar massage, and regular skin care of L knee in order to assist in improving condition    Baseline 10/29/2015:  Reports has been completeing manual techniques, ice application for pain and edema    Status Achieved     PT SHORT TERM GOAL #4    Title Patient to be independent in correctly and consistently performign appropriate HEP, to be updated PRN    Baseline 10/29/2015:  Reports compliance wiht HEP daily   Status Achieved           PT Long Term Goals - 10/29/15 0839      PT LONG TERM GOAL #1   Title Patient to demonstrate L knee ROM 0-110 degrees in order to improve mechanics and  general gait and mobility   Status Not Met     PT LONG TERM GOAL #2   Title Patient to be able to ambulate unlimited distances with no assistive device, correct mechanics including full heel-toe pattern, equal step/stance lengths/times, upright posture in order to allow him to access community    Baseline 10/29/2015: decreased stance time, knee flexion noted during mid stance    Status On-going     PT LONG TERM GOAL #3   Title Patient to be able to reciprocally ascend/descent full flight of stairs with U railing, minimal unsteadiness, good eccentric control in order to allow him to access the community    Baseline 10/29/2015:  Step to pattern   Status Not Met     PT LONG TERM GOAL #4   Title Patient to report he has been able to retun to regular exercise program, at least 30 minutes in duration, at least 4 days per week, in order to [facility] functional gains and improve overall health status    Baseline 10/29/2015:  Reports going to gym 2-3 days a week working on upper body strength   Status Partially Met               Plan - 11/05/15 0914    Clinical Impression Statement Continued focus on improving ROM.  Soreness is persistent in knee, however no pain.  Spent large amount of time on manual techniques to decrease adhesions and scar tissue.  Distal quad/proximal scar with large amount of scar tissue that was able to soften with myofascial techniques.  Also completed manual wtih knee in flexed position to further utilize adhesions.  Pt reported overall "looser" at end of session.     Rehab Potential Good   PT Frequency 3x / week   PT  Duration 6 weeks   PT Treatment/Interventions ADLs/Self Care Home Management;Cryotherapy;Moist Heat;DME Instruction;Gait training;Stair training;Functional mobility training;Therapeutic activities;Therapeutic exercise;Balance training;Neuromuscular re-education;Patient/family education;Manual techniques;Scar mobilization;Passive range of motion;Energy conservation;Taping   PT Next Visit Plan manual  techniques to improve knee ext/flexion ROM; fibula mobility; continue gastroc strength/pre-gait/introduce more balance next visit   PT Home Exercise Plan no updates    Consulted and Agree with Plan of Care Patient      Patient will benefit from skilled therapeutic intervention in order to improve the following deficits and impairments:  Abnormal gait, Decreased skin integrity, Hypomobility, Decreased scar mobility, Increased edema, Decreased activity tolerance, Decreased strength, Pain, Decreased balance, Decreased mobility, Difficulty walking, Decreased range of motion, Improper body mechanics, Decreased coordination, Impaired flexibility, Postural dysfunction  Visit Diagnosis: Stiffness of left knee, not elsewhere classified  Localized edema  Muscle weakness (generalized)  Difficulty in walking, not elsewhere classified     Problem List Patient Active Problem List   Diagnosis Date Noted  . Essential hypertension 09/08/2015  . Macular degeneration 09/08/2015  . IBS (irritable bowel syndrome) 09/08/2015  . Dyslipidemia 09/08/2015  . Primary osteoarthritis of left knee   . ACHILLES TENDINITIS 10/02/2009  . RUPTURE ROTATOR CUFF 03/25/2009  . IMPINGEMENT SYNDROME 03/06/2009    Teena Irani, PTA/CLT [phone]  11/05/2015, 11:13 AM  [facility] [address] [place], [place], [address] Phone: [phone]   Fax:  [phone]  Name: ALA CAPRI MRN: [id number] Date of Birth: 03/30/1927

## 2015-11-07 ENCOUNTER — Ambulatory Visit (HOSPITAL_COMMUNITY): Payer: Medicare Other

## 2015-11-07 DIAGNOSIS — R600 Localized edema: Secondary | ICD-10-CM | POA: Diagnosis not present

## 2015-11-07 DIAGNOSIS — M25662 Stiffness of left knee, not elsewhere classified: Secondary | ICD-10-CM | POA: Diagnosis not present

## 2015-11-07 DIAGNOSIS — R262 Difficulty in walking, not elsewhere classified: Secondary | ICD-10-CM

## 2015-11-07 DIAGNOSIS — M6281 Muscle weakness (generalized): Secondary | ICD-10-CM

## 2015-11-07 NOTE — Therapy (Signed)
[facility] [address] [place], [place], [address] Phone: [phone]   Fax:  [phone]  Physical Therapy Treatment  Patient Details  Name: Christopher Warner MRN: [id number] Date of Birth: [birth year] Referring Provider: Arther Abbott  Encounter Date: 11/07/2015      PT End of Session - 11/07/15 0821    Visit Number 13   Number of Visits 18   Date for PT Re-Evaluation 11/10/15   Authorization Type [employer] Medicare (G-codes done 6th visit)   Authorization Time Period 09/29/15 to 11/10/15   Authorization - Visit Number 66   Authorization - Number of Visits 16   PT Start Time 0815   PT Stop Time 0856   PT Time Calculation (min) 41 min   Activity Tolerance Patient tolerated treatment well   Behavior During Therapy [facility] for tasks assessed/performed      Past Medical History:  Diagnosis Date  . Arthritis   . Dyslipidemia   . Hypertension   . IBS (irritable bowel syndrome)    With loose stool  . Macular degeneration disease     Past Surgical History:  Procedure Laterality Date  . back surgeries  x   x 3  . ELBOW SURGERY    . KNEE CARTILAGE SURGERY    . SHOULDER SURGERY     rotator cuff repair (rt)  . TOE SURGERY    . TONSILLECTOMY    . TOTAL KNEE ARTHROPLASTY Left 09/03/2015   Procedure: TOTAL KNEE ARTHROPLASTY;  Surgeon: Carole Civil, MD;  Location: [facility];  Service: Orthopedics;  Laterality: Left;  . YAG LASER APPLICATION Right 10/28/6413   Procedure: YAG LASER APPLICATION;  Surgeon: Williams Che, MD;  Location: [facility];  Service: Ophthalmology;  Laterality: Right;    There were no vitals filed for this visit.      Subjective Assessment - 11/07/15 0813    Subjective Pt stated Lt knee is sore today, pain scale 3/10.   Pertinent History HTN, macular degeneration, history of several back surgeries    Patient Stated Goals get back to PLOF    Currently in Pain? Yes   Pain Score 3    Pain Location Knee   Pain  Orientation Left   Pain Descriptors / Indicators Sore;Discomfort   Pain Type Surgical pain   Pain Radiating Towards none   Pain Onset More than a month ago   Pain Frequency Constant   Aggravating Factors  not sure    Pain Relieving Factors nothing   Effect of Pain on Daily Activities nothing more than usual             [facility] Adult PT Treatment/Exercise - 11/07/15 0001      Knee/Hip Exercises: Stretches   Active Hamstring Stretch 3 reps;30 seconds   Active Hamstring Stretch Limitations 12 inch box    Knee: Self-Stretch Limitations knee drives 83E 10" holds on 12in step   Gastroc Stretch Both;3 reps;30 seconds   Gastroc Stretch Limitations slant board     Knee/Hip Exercises: Standing   Knee Flexion Left;10 reps   Knee Flexion Limitations TKF on 6" box     Knee/Hip Exercises: Supine   Quad Sets Left;20 reps   Quad Sets Limitations 3" holds   Heel Slides AAROM;Left;10 reps;2 sets     Manual Therapy   Manual Therapy Soft tissue mobilization;Myofascial release   Manual therapy comments Manual complete separate rest of treatment    Soft tissue mobilization perimeter of scar and scar  to decrease adhesions   Myofascial Release scar tissue massage distal quad/incision   Passive ROM flexion and extension                   PT Short Term Goals - 10/29/15 0836      PT SHORT TERM GOAL #1   Title Patient will demonstrate L knee ROM of 8-100 degrees in order to improve mechanics and reduce discomfort   Baseline 10/29/2015:  AROM 6-98 degrees   Status Partially Met     PT SHORT TERM GOAL #2   Title Patient to be able to ambulate unlimited distances with SPC with pain L knee no more than 2/10 in order to improve mobility and QOL    Baseline 10/29/2015:  Ambulates with no AD for 35-40 minutes with no pain   Status Achieved     PT SHORT TERM GOAL #3   Title Patient to be independent in correctly and consistently applying ice, performing scar massage, and regular skin care  of L knee in order to assist in improving condition    Baseline 10/29/2015:  Reports has been completeing manual techniques, ice application for pain and edema    Status Achieved     PT SHORT TERM GOAL #4   Title Patient to be independent in correctly and consistently performign appropriate HEP, to be updated PRN    Baseline 10/29/2015:  Reports compliance wiht HEP daily   Status Achieved           PT Long Term Goals - 10/29/15 0839      PT LONG TERM GOAL #1   Title Patient to demonstrate L knee ROM 0-110 degrees in order to improve mechanics and  general gait and mobility   Status Not Met     PT LONG TERM GOAL #2   Title Patient to be able to ambulate unlimited distances with no assistive device, correct mechanics including full heel-toe pattern, equal step/stance lengths/times, upright posture in order to allow him to access community    Baseline 10/29/2015: decreased stance time, knee flexion noted during mid stance    Status On-going     PT LONG TERM GOAL #3   Title Patient to be able to reciprocally ascend/descent full flight of stairs with U railing, minimal unsteadiness, good eccentric control in order to allow him to access the community    Baseline 10/29/2015:  Step to pattern   Status Not Met     PT LONG TERM GOAL #4   Title Patient to report he has been able to retun to regular exercise program, at least 30 minutes in duration, at least 4 days per week, in order to [facility] functional gains and improve overall health status    Baseline 10/29/2015:  Reports going to gym 2-3 days a week working on upper body strength   Status Partially Met               Plan - 11/07/15 0853    Clinical Impression Statement Continues session focus on improving AROM.  Pt continues to present with adhesions perimeter of knee and scar tissue.  Manual technqiues complete to reduce fascial restrictions to assist with ROM.  Pt reports knee feels "looser" at EOS, no reports of pain.      Rehab Potential Good   PT Frequency 3x / week   PT Duration 6 weeks   PT Treatment/Interventions ADLs/Self Care Home Management;Cryotherapy;Moist Heat;DME Instruction;Gait training;Stair training;Functional mobility training;Therapeutic activities;Therapeutic exercise;Balance training;Neuromuscular re-education;Patient/family education;Manual techniques;Scar mobilization;Passive  range of motion;Energy conservation;Taping   PT Next Visit Plan manual techniques to improve knee ext/flexion ROM; fibula mobility; continue gastroc strength/pre-gait/introduce more balance next visit      Patient will benefit from skilled therapeutic intervention in order to improve the following deficits and impairments:  Abnormal gait, Decreased skin integrity, Hypomobility, Decreased scar mobility, Increased edema, Decreased activity tolerance, Decreased strength, Pain, Decreased balance, Decreased mobility, Difficulty walking, Decreased range of motion, Improper body mechanics, Decreased coordination, Impaired flexibility, Postural dysfunction  Visit Diagnosis: Stiffness of left knee, not elsewhere classified  Localized edema  Muscle weakness (generalized)  Difficulty in walking, not elsewhere classified     Problem List Patient Active Problem List   Diagnosis Date Noted  . Essential hypertension 09/08/2015  . Macular degeneration 09/08/2015  . IBS (irritable bowel syndrome) 09/08/2015  . Dyslipidemia 09/08/2015  . Primary osteoarthritis of left knee   . ACHILLES TENDINITIS 10/02/2009  . RUPTURE ROTATOR CUFF 03/25/2009  . IMPINGEMENT SYNDROME 03/06/2009   Ihor Austin, LPTA; [place]  Aldona Lento 11/07/2015, 9:00 AM  [employer] [address] [place], [place], [address] Phone: [phone]   Fax:  [phone]  Name: Christopher Warner MRN: [id number] Date of Birth: 21-Apr-1927

## 2015-11-10 ENCOUNTER — Ambulatory Visit (HOSPITAL_COMMUNITY): Payer: Medicare Other | Admitting: Physical Therapy

## 2015-11-10 DIAGNOSIS — R262 Difficulty in walking, not elsewhere classified: Secondary | ICD-10-CM | POA: Diagnosis not present

## 2015-11-10 DIAGNOSIS — R600 Localized edema: Secondary | ICD-10-CM | POA: Diagnosis not present

## 2015-11-10 DIAGNOSIS — M25662 Stiffness of left knee, not elsewhere classified: Secondary | ICD-10-CM

## 2015-11-10 DIAGNOSIS — M6281 Muscle weakness (generalized): Secondary | ICD-10-CM

## 2015-11-10 NOTE — Therapy (Signed)
[place], [place], [address] Phone: [phone]   Fax:  [phone]  Physical Therapy Treatment/Reassessment  Patient Details  Name: Christopher Warner MRN: [id number] Date of Birth: 09-Dec-1927 Referring Provider: Arther Abbott, MD  Encounter Date: 11/10/2015      PT End of Session - 11/10/15 0959    Visit Number 14   Number of Visits 18   Date for PT Re-Evaluation 12/08/15   Authorization Type [employer] Medicare (G-codes done 14th visit)   Authorization Time Period 09/29/15 to 11/10/15 NEW: 11/11/15 to 12/08/15   Authorization - Visit Number 14   Authorization - Number of Visits 16   PT Start Time 0902   PT Stop Time 0941   PT Time Calculation (min) 39 min   Activity Tolerance Patient tolerated treatment well   Behavior During Therapy [facility] for tasks assessed/performed      Past Medical History:  Diagnosis Date  . Arthritis   . Dyslipidemia   . Hypertension   . IBS (irritable bowel syndrome)    With loose stool  . Macular degeneration disease     Past Surgical History:  Procedure Laterality Date  . back surgeries  x   x 3  . ELBOW SURGERY    . KNEE CARTILAGE SURGERY    . SHOULDER SURGERY     rotator cuff repair (rt)  . TOE SURGERY    . TONSILLECTOMY    . TOTAL KNEE ARTHROPLASTY Left 09/03/2015   Procedure: TOTAL KNEE ARTHROPLASTY;  Surgeon: Carole Civil, MD;  Location: [facility];  Service: Orthopedics;  Laterality: Left;  . YAG LASER APPLICATION Right 05/06/3660   Procedure: YAG LASER APPLICATION;  Surgeon: Williams Che, MD;  Location: [facility];  Service: Ophthalmology;  Laterality: Right;    There were no vitals filed for this visit.      Subjective Assessment - 11/10/15 0903    Subjective Pt reports things are good today. His soreness is gone, but he only has some stiffness. He feels he has improved 75%. He has been going to the gym 2-3x/week to work his arms.   Pertinent History HTN, macular  degeneration, history of several back surgeries    How long can you sit comfortably? unlimited    How long can you stand comfortably? unsure   How long can you walk comfortably? doesn't feel limited with this   Patient Stated Goals get back to PLOF    Currently in Pain? No/denies   Pain Onset More than a month ago            [facility] PT Assessment - 11/10/15 0001      Assessment   Medical Diagnosis L TKR    Referring Provider Arther Abbott, MD   Onset Date/Surgical Date 09/03/15   Next MD Visit end of August     Balance Screen   Has the patient fallen in the past 6 months No   Has the patient had a decrease in activity level because of a fear of falling?  No   Is the patient reluctant to leave their home because of a fear of falling?  No     Prior Function   Level of Independence Independent;Independent with basic ADLs;Independent with gait;Independent with transfers   Vocation Retired   Leisure grows plants for [employer]      Observation/Other Assessments   Skin Integrity incision appears well healing at this imte with no acute signs of inflammation or  infection   Focus on Therapeutic Outcomes (FOTO)  67% limited      Posture/Postural Control   Posture/Postural Control Postural limitations   Postural Limitations Rounded Shoulders;Forward head;Flexed trunk;Weight shift right     AROM   Left Knee Extension 10   Left Knee Flexion 86     Strength   Right Hip Flexion 5/5   Right Hip Extension 4-/5   Right Hip ABduction 5/5   Left Hip Flexion 5/5   Left Hip Extension 4-/5   Left Hip ABduction 5/5   Right Knee Flexion 5/5   Right Knee Extension 5/5   Left Knee Flexion 5/5  within available ROM; seated   Left Knee Extension 5/5  within available ROM    Right Ankle Dorsiflexion 5/5   Left Ankle Dorsiflexion 5/5     Ambulation/Gait   Gait Comments flexed posture, decreased L knee extension during IC, decreased stance time LT, decreased RUE swing     6 minute  walk test results    Aerobic Endurance Distance Walked 652   Endurance additional comments 3MWT without AD     Balance   Balance Assessed Yes     High Level Balance   High Level Balance Comments TUG: 8 sec, no AD                     [facility] Adult PT Treatment/Exercise - 11/10/15 0001      Knee/Hip Exercises: Seated   Knee/Hip Flexion Lt knee flexion stretch 5x10 sec hold      Knee/Hip Exercises: Supine   Heel Slides AAROM;Left;1 set;5 reps   Heel Slides Limitations 10 sec hold       manual Treatment: LLE retrograde massage with BLE elevated           PT Education - 11/10/15 0958    Education provided Yes   Education Details encouraged pt to include LE work at the gym; HEP and adherence to exercises provided by PT to address specific impairments   Person(s) Educated Patient   Methods Explanation;Demonstration;Handout   Comprehension Need further instruction;Verbalized understanding;Returned demonstration          PT Short Term Goals - 11/10/15 0920      PT SHORT TERM GOAL #1   Title Patient will demonstrate L knee ROM of 8-100 degrees in order to improve mechanics and reduce discomfort   Baseline 10/29/2015:  AROM 6-98 degrees; 11/10/15: AROM 10-86 degrees   Status Partially Met     PT SHORT TERM GOAL #2   Title Patient to be able to ambulate unlimited distances with SPC with pain L knee no more than 2/10 in order to improve mobility and QOL    Baseline 10/29/2015:  Ambulates with no AD for 35-40 minutes with no pain; 11/10/15: only using SPC when going outdoors, 4/10 max pain, unsure how far he can go, but doesn't feel like he is limited   Status Achieved     PT SHORT TERM GOAL #3   Title Patient to be independent in correctly and consistently applying ice, performing scar massage, and regular skin care of L knee in order to assist in improving condition    Baseline 10/29/2015:  Reports has been completeing manual techniques, ice application for pain  and edema; 11/10/15: "occasionally icing/elevating knee"   Status Partially Met     PT SHORT TERM GOAL #4   Title Patient to be independent in correctly and consistently performign appropriate HEP, to be updated PRN  Baseline 10/29/2015:  Reports compliance wiht HEP daily; 11/10/15: non-compliant with knee flexion stretches, but reports he is performing his other exercises   Status Partially Met           PT Long Term Goals - 11/10/15 0924      PT LONG TERM GOAL #1   Title Patient to demonstrate L knee ROM 0-110 degrees in order to improve mechanics and  general gait and mobility   Baseline 11/10/15:    Status Not Met     PT LONG TERM GOAL #2   Title Patient to be able to ambulate unlimited distances with no assistive device, correct mechanics including full heel-toe pattern, equal step/stance lengths/times, upright posture in order to allow him to access community    Baseline 10/29/2015: decreased stance time, knee flexion noted during mid stance; 11/10/15: same    Status On-going     PT LONG TERM GOAL #3   Title Patient to be able to reciprocally ascend/descent full flight of stairs with U railing, minimal unsteadiness, good eccentric control in order to allow him to access the community    Baseline 10/29/2015:  Step to pattern; 11/10/15:   Status Not Met     PT LONG TERM GOAL #4   Title Patient to report he has been able to retun to regular exercise program, at least 30 minutes in duration, at least 4 days per week, in order to [facility] functional gains and improve overall health status    Baseline 10/29/2015:  Reports going to gym 2-3 days a week working on upper body strength; 11/10/15: going 2-3x/week to work on UE only    Status Partially Met               Plan - 11/10/15 1000    Clinical Impression Statement Mr. Beske was re-evaluated this visit having made progress in functional strength, balance and endurance. He continues to present with swelling and joint  limitations which are impacting his knee ROM (10-86deg). Also noting gait impairments and increased difficulty maintaining straight path during 3MWT, without LOB.  Overall, he feels he has made ~75% improvement since beginning PT, but is concerned with the stiffness he continues to have. He requires assistance and encouragement to correctly perform therex addressing his knee flexion ROM and would benefit from several more sessions of skilled PT to address knee swelling/ROM and ensure proper technique and HEP adherence.    Rehab Potential Good   PT Frequency 2x / week   PT Duration 4 weeks   PT Treatment/Interventions ADLs/Self Care Home Management;Cryotherapy;Moist Heat;DME Instruction;Gait training;Stair training;Functional mobility training;Therapeutic activities;Therapeutic exercise;Balance training;Neuromuscular re-education;Patient/family education;Manual techniques;Scar mobilization;Passive range of motion;Energy conservation;Taping   PT Next Visit Plan Follow up on technique of HEP provided this visit. Manual techniques to improve swelling, knee ext/flexion ROM; fibula mobility; work on dynamic balance   PT Home Exercise Plan seated knee flexion stretch, heel slide knee flexion stretch   Recommended Other Services possible compression garment if swelling does no improve   Consulted and Agree with Plan of Care Patient      Patient will benefit from skilled therapeutic intervention in order to improve the following deficits and impairments:  Abnormal gait, Decreased skin integrity, Hypomobility, Decreased scar mobility, Increased edema, Decreased activity tolerance, Decreased strength, Pain, Decreased balance, Decreased mobility, Difficulty walking, Decreased range of motion, Improper body mechanics, Decreased coordination, Impaired flexibility, Postural dysfunction  Visit Diagnosis: Stiffness of left knee, not elsewhere classified  Localized edema  Muscle weakness (generalized)  Difficulty  in walking, not elsewhere classified       G-Codes - 2015/11/23 1014    Functional Assessment Tool Used clinical judgement based on assessment of ROM, strength and functional mobility   Functional Limitation Mobility: Walking and moving around   Mobility: Walking and Moving Around Current Status [phone]) At least 20 percent but less than 40 percent impaired, limited or restricted   Mobility: Walking and Moving Around Goal Status [phone]) At least 20 percent but less than 40 percent impaired, limited or restricted      Problem List Patient Active Problem List   Diagnosis Date Noted  . Essential hypertension 09/08/2015  . Macular degeneration 09/08/2015  . IBS (irritable bowel syndrome) 09/08/2015  . Dyslipidemia 09/08/2015  . Primary osteoarthritis of left knee   . ACHILLES TENDINITIS 10/02/2009  . RUPTURE ROTATOR CUFF 03/25/2009  . IMPINGEMENT SYNDROME 03/06/2009    10:18 AM,2015/11/23 Elly Modena PT, DPT [facility] [place] [address] [place], [place], [address] Phone: [phone]   Fax:  [phone]  Name: Christopher Warner MRN: [id number] Date of Birth: Jan 11, 1928

## 2015-11-12 ENCOUNTER — Encounter (HOSPITAL_COMMUNITY): Payer: Medicare Other | Admitting: Physical Therapy

## 2015-11-13 DIAGNOSIS — S88012A Complete traumatic amputation at knee level, left lower leg, initial encounter: Secondary | ICD-10-CM | POA: Diagnosis not present

## 2015-11-13 DIAGNOSIS — G3184 Mild cognitive impairment, so stated: Secondary | ICD-10-CM | POA: Diagnosis not present

## 2015-11-13 DIAGNOSIS — I10 Essential (primary) hypertension: Secondary | ICD-10-CM | POA: Diagnosis not present

## 2015-11-13 DIAGNOSIS — E782 Mixed hyperlipidemia: Secondary | ICD-10-CM | POA: Diagnosis not present

## 2015-11-14 ENCOUNTER — Ambulatory Visit (HOSPITAL_COMMUNITY): Payer: Medicare Other

## 2015-11-14 DIAGNOSIS — R600 Localized edema: Secondary | ICD-10-CM

## 2015-11-14 DIAGNOSIS — M6281 Muscle weakness (generalized): Secondary | ICD-10-CM

## 2015-11-14 DIAGNOSIS — M25662 Stiffness of left knee, not elsewhere classified: Secondary | ICD-10-CM

## 2015-11-14 DIAGNOSIS — R262 Difficulty in walking, not elsewhere classified: Secondary | ICD-10-CM | POA: Diagnosis not present

## 2015-11-14 NOTE — Therapy (Signed)
[place], [place], [address] Phone: [phone]   Fax:  [phone]  Physical Therapy Treatment  Patient Details  Name: Christopher Warner MRN: [id number] Date of Birth: [birth year] Referring Provider: Arther Abbott, MD  Encounter Date: 11/14/2015      PT End of Session - 11/14/15 0848    Visit Number 15   Number of Visits 18   Date for PT Re-Evaluation 12/08/15   Authorization Type [employer] Medicare (G-codes done 14th visit)   Authorization Time Period 09/29/15 to 11/10/15 NEW: 11/11/15 to 12/08/15   Authorization - Visit Number 15   Authorization - Number of Visits 24   PT Start Time 0816   PT Stop Time 0901   PT Time Calculation (min) 45 min   Activity Tolerance Patient tolerated treatment well   Behavior During Therapy [facility] for tasks assessed/performed      Past Medical History:  Diagnosis Date  . Arthritis   . Dyslipidemia   . Hypertension   . IBS (irritable bowel syndrome)    With loose stool  . Macular degeneration disease     Past Surgical History:  Procedure Laterality Date  . back surgeries  x   x 3  . ELBOW SURGERY    . KNEE CARTILAGE SURGERY    . SHOULDER SURGERY     rotator cuff repair (rt)  . TOE SURGERY    . TONSILLECTOMY    . TOTAL KNEE ARTHROPLASTY Left 09/03/2015   Procedure: TOTAL KNEE ARTHROPLASTY;  Surgeon: Carole Civil, MD;  Location: [facility];  Service: Orthopedics;  Laterality: Left;  . YAG LASER APPLICATION Right 0/0/9233   Procedure: YAG LASER APPLICATION;  Surgeon: Williams Che, MD;  Location: [facility];  Service: Ophthalmology;  Laterality: Right;    There were no vitals filed for this visit.      Subjective Assessment - 11/14/15 0819    Subjective Pt stated knee is feeling okay today, minor pain and stiffness this morning.  Pain scale 2/10.  Reports compliance with new HEP with no questions.  Knee continues to exhibit swelling.   Pertinent History HTN, macular  degeneration, history of several back surgeries    Patient Stated Goals get back to PLOF    Currently in Pain? Yes   Pain Score 2    Pain Location Knee   Pain Orientation Left   Pain Descriptors / Indicators Sore;Tightness  Stiffness   Pain Type Surgical pain   Pain Radiating Towards none   Pain Onset More than a month ago   Pain Frequency Constant   Aggravating Factors  not sure   Pain Relieving Factors nothing   Effect of Pain on Daily Activities nothing more than usual.                          [facility] Adult PT Treatment/Exercise - 11/14/15 0001      Ambulation/Gait   Gait Comments flexed posture, decreased L knee extension during IC, decreased stance time LT, decreased RUE swing     Posture/Postural Control   Posture/Postural Control Postural limitations   Postural Limitations Rounded Shoulders;Forward head;Flexed trunk;Weight shift right     Knee/Hip Exercises: Stretches   Active Hamstring Stretch 3 reps;30 seconds   Active Hamstring Stretch Limitations 12 inch box    Knee: Self-Stretch Limitations knee drives 00T 10" holds on 12in step   Gastroc Stretch Both;3 reps;30 seconds   Gastroc Stretch Limitations  slant board     Knee/Hip Exercises: Standing   Knee Flexion Left;10 reps   Knee Flexion Limitations TKF on 6" box   Other Standing Knee Exercises tandem stance Lt LE behind with TKE 3x 30"     Knee/Hip Exercises: Seated   Knee/Hip Flexion Lt knee flexion stretch 10x10 sec hold      Knee/Hip Exercises: Supine   Quad Sets Left;20 reps   Short Arc [employer] 15 reps   Short Arc Quad Sets Limitations 3" holds   Heel Slides AAROM;Left;1 set;5 reps   Heel Slides Limitations 10 sec hold      Manual Therapy   Manual Therapy Soft tissue mobilization;Myofascial release   Manual therapy comments Manual complete separate rest of treatment    Edema Management retrograde massage L knee with LEs elevated    Joint Mobilization tib/fib A/P, patella mobs all  directions   Soft tissue mobilization Distal quad    Myofascial Release scar tissue massage distal quad/incision   Passive ROM flexion and extension                   PT Short Term Goals - 11/10/15 0920      PT SHORT TERM GOAL #1   Title Patient will demonstrate L knee ROM of 8-100 degrees in order to improve mechanics and reduce discomfort   Baseline 10/29/2015:  AROM 6-98 degrees; 11/10/15: AROM 10-86 degrees   Status Partially Met     PT SHORT TERM GOAL #2   Title Patient to be able to ambulate unlimited distances with SPC with pain L knee no more than 2/10 in order to improve mobility and QOL    Baseline 10/29/2015:  Ambulates with no AD for 35-40 minutes with no pain; 11/10/15: only using SPC when going outdoors, 4/10 max pain, unsure how far he can go, but doesn't feel like he is limited   Status Achieved     PT SHORT TERM GOAL #3   Title Patient to be independent in correctly and consistently applying ice, performing scar massage, and regular skin care of L knee in order to assist in improving condition    Baseline 10/29/2015:  Reports has been completeing manual techniques, ice application for pain and edema; 11/10/15: "occasionally icing/elevating knee"   Status Partially Met     PT SHORT TERM GOAL #4   Title Patient to be independent in correctly and consistently performign appropriate HEP, to be updated PRN    Baseline 10/29/2015:  Reports compliance wiht HEP daily; 11/10/15: non-compliant with knee flexion stretches, but reports he is performing his other exercises   Status Partially Met           PT Long Term Goals - 11/10/15 0924      PT LONG TERM GOAL #1   Title Patient to demonstrate L knee ROM 0-110 degrees in order to improve mechanics and  general gait and mobility   Baseline 11/10/15:    Status Not Met     PT LONG TERM GOAL #2   Title Patient to be able to ambulate unlimited distances with no assistive device, correct mechanics including full  heel-toe pattern, equal step/stance lengths/times, upright posture in order to allow him to access community    Baseline 10/29/2015: decreased stance time, knee flexion noted during mid stance; 11/10/15: same    Status On-going     PT LONG TERM GOAL #3   Title Patient to be able to reciprocally ascend/descent full flight of stairs with  U railing, minimal unsteadiness, good eccentric control in order to allow him to access the community    Baseline 10/29/2015:  Step to pattern; 11/10/15:   Status Not Met     PT LONG TERM GOAL #4   Title Patient to report he has been able to retun to regular exercise program, at least 30 minutes in duration, at least 4 days per week, in order to [facility] functional gains and improve overall health status    Baseline 10/29/2015:  Reports going to gym 2-3 days a week working on upper body strength; 11/10/15: going 2-3x/week to work on UE only    Status Partially [place] - 11/14/15 0849    Clinical Impression Statement Continued session focus on improving AROM for extension and flexion.  Began session with manual techniques to reduce edema present proximal knee and joint mobilization technqiues.  Pt continues to display joint and facial restrictions limiting ROM.  Pt educated on benefits of compression hose to assist with edema control.  Referral sent to MD for thigh compression hose 20-30 mmHg.  Reviewed technique with new HEP to assist with ROM, pt able to verbalize and demonstrate appropriate form with new exercises.  No reports of pain through session.     Rehab Potential Good   PT Frequency 2x / week   PT Duration 4 weeks   PT Treatment/Interventions ADLs/Self Care Home Management;Cryotherapy;Moist Heat;DME Instruction;Gait training;Stair training;Functional mobility training;Therapeutic activities;Therapeutic exercise;Balance training;Neuromuscular re-education;Patient/family education;Manual techniques;Scar mobilization;Passive range of  motion;Energy conservation;Taping   PT Next Visit Plan  Manual techniques to improve swelling, knee ext/flexion ROM; fibula mobility; work on dynamic balance.   PT Home Exercise Plan seated knee flexion stretch, heel slide knee flexion stretch   Recommended Other Services Referral sent to MD on 11/14/2015 for thigh high compression hose 20-33mHg      Patient will benefit from skilled therapeutic intervention in order to improve the following deficits and impairments:  Abnormal gait, Decreased skin integrity, Hypomobility, Decreased scar mobility, Increased edema, Decreased activity tolerance, Decreased strength, Pain, Decreased balance, Decreased mobility, Difficulty walking, Decreased range of motion, Improper body mechanics, Decreased coordination, Impaired flexibility, Postural dysfunction  Visit Diagnosis: Stiffness of left knee, not elsewhere classified  Localized edema  Muscle weakness (generalized)  Difficulty in walking, not elsewhere classified     Problem List Patient Active Problem List   Diagnosis Date Noted  . Essential hypertension 09/08/2015  . Macular degeneration 09/08/2015  . IBS (irritable bowel syndrome) 09/08/2015  . Dyslipidemia 09/08/2015  . Primary osteoarthritis of left knee   . ACHILLES TENDINITIS 10/02/2009  . RUPTURE ROTATOR CUFF 03/25/2009  . IMPINGEMENT SYNDROME 03/06/2009   CIhor Austin LPTA; CRabun CAldona Lento8/18/2017, 9:07 AM  [place] [address]Phone: [phone]  Fax:  [phone] Name: Christopher Warner: [id number] of Birth: 1December 30, 1929

## 2015-11-17 ENCOUNTER — Ambulatory Visit (HOSPITAL_COMMUNITY): Payer: Medicare Other | Admitting: Physical Therapy

## 2015-11-17 DIAGNOSIS — M6281 Muscle weakness (generalized): Secondary | ICD-10-CM | POA: Diagnosis not present

## 2015-11-17 DIAGNOSIS — R262 Difficulty in walking, not elsewhere classified: Secondary | ICD-10-CM

## 2015-11-17 DIAGNOSIS — M25662 Stiffness of left knee, not elsewhere classified: Secondary | ICD-10-CM

## 2015-11-17 DIAGNOSIS — R600 Localized edema: Secondary | ICD-10-CM

## 2015-11-17 NOTE — Therapy (Signed)
[facility] [address] [place], [place], [address] Phone: [phone]   Fax:  [phone]  Physical Therapy Treatment  Patient Details  Name: Christopher Warner MRN: [id number] Date of Birth: 03/22/1928 Referring Provider: Arther Abbott, MD  Encounter Date: 11/17/2015      PT End of Session - 11/17/15 0859    Visit Number 16   Number of Visits 18   Date for PT Re-Evaluation 12/08/15   Authorization Type [employer] Medicare (G-codes done 14th visit)   Authorization Time Period 09/29/15 to 11/10/15 NEW: 11/11/15 to 12/08/15   Authorization - Visit Number 70   Authorization - Number of Visits 24   PT Start Time 8115   PT Stop Time 0858   PT Time Calculation (min) 41 min   Activity Tolerance Patient tolerated treatment well   Behavior During Therapy [facility] for tasks assessed/performed      Past Medical History:  Diagnosis Date  . Arthritis   . Dyslipidemia   . Hypertension   . IBS (irritable bowel syndrome)    With loose stool  . Macular degeneration disease     Past Surgical History:  Procedure Laterality Date  . back surgeries  x   x 3  . ELBOW SURGERY    . KNEE CARTILAGE SURGERY    . SHOULDER SURGERY     rotator cuff repair (rt)  . TOE SURGERY    . TONSILLECTOMY    . TOTAL KNEE ARTHROPLASTY Left 09/03/2015   Procedure: TOTAL KNEE ARTHROPLASTY;  Surgeon: Carole Civil, MD;  Location: [facility];  Service: Orthopedics;  Laterality: Left;  . YAG LASER APPLICATION Right 09/27/6201   Procedure: YAG LASER APPLICATION;  Surgeon: Williams Che, MD;  Location: [facility];  Service: Ophthalmology;  Laterality: Right;    There were no vitals filed for this visit.      Subjective Assessment - 11/17/15 0819    Subjective Patient arrives stating he had a good weekend, he is not having any pain today and is doing well. Nothing major going on otherwise.    Pertinent History HTN, macular degeneration, history of several back surgeries    Currently in Pain? No/denies            [facility] PT Assessment - 11/17/15 0001      AROM   Right Knee Extension 6   Right Knee Flexion 114   Left Knee Extension 9   Left Knee Flexion 95  after manual                      [facility] Adult PT Treatment/Exercise - 11/17/15 0001      Knee/Hip Exercises: Stretches   Active Hamstring Stretch 3 reps;30 seconds   Active Hamstring Stretch Limitations 12 inch box    Knee: Self-Stretch Limitations knee drives 55H 10" holds on 12in step   Gastroc Stretch Both;3 reps;30 seconds   Gastroc Stretch Limitations slant board     Knee/Hip Exercises: Seated   Hamstring Curl 15 reps   Hamstring Limitations 5 second holds, AAROM      Manual Therapy   Manual Therapy Edema management;Joint mobilization   Manual therapy comments Manual complete separate rest of treatment    Edema Management retrograde massage L knee with LEs elevated    Joint Mobilization patella all directions, knee extension, knee flexion grade III    Soft tissue mobilization scar massage  Balance Exercises - 11/17/15 0849      Balance Exercises: Standing   Tandem Stance Eyes open;3 reps;Foam/compliant surface   SLS Eyes open;Solid surface;3 reps   Rockerboard Anterior/posterior;Lateral;Intermittent UE support;Other (comment)  1 min each way in // bars            PT Education - 11/17/15 0859    Education provided No          PT Short Term Goals - 11/10/15 0920      PT SHORT TERM GOAL #1   Title Patient will demonstrate L knee ROM of 8-100 degrees in order to improve mechanics and reduce discomfort   Baseline 10/29/2015:  AROM 6-98 degrees; 11/10/15: AROM 10-86 degrees   Status Partially Met     PT SHORT TERM GOAL #2   Title Patient to be able to ambulate unlimited distances with SPC with pain L knee no more than 2/10 in order to improve mobility and QOL    Baseline 10/29/2015:  Ambulates with no AD for 35-40 minutes with no pain;  11/10/15: only using SPC when going outdoors, 4/10 max pain, unsure how far he can go, but doesn't feel like he is limited   Status Achieved     PT SHORT TERM GOAL #3   Title Patient to be independent in correctly and consistently applying ice, performing scar massage, and regular skin care of L knee in order to assist in improving condition    Baseline 10/29/2015:  Reports has been completeing manual techniques, ice application for pain and edema; 11/10/15: "occasionally icing/elevating knee"   Status Partially Met     PT SHORT TERM GOAL #4   Title Patient to be independent in correctly and consistently performign appropriate HEP, to be updated PRN    Baseline 10/29/2015:  Reports compliance wiht HEP daily; 11/10/15: non-compliant with knee flexion stretches, but reports he is performing his other exercises   Status Partially Met           PT Long Term Goals - 11/10/15 0924      PT LONG TERM GOAL #1   Title Patient to demonstrate L knee ROM 0-110 degrees in order to improve mechanics and  general gait and mobility   Baseline 11/10/15:    Status Not Met     PT LONG TERM GOAL #2   Title Patient to be able to ambulate unlimited distances with no assistive device, correct mechanics including full heel-toe pattern, equal step/stance lengths/times, upright posture in order to allow him to access community    Baseline 10/29/2015: decreased stance time, knee flexion noted during mid stance; 11/10/15: same    Status On-going     PT LONG TERM GOAL #3   Title Patient to be able to reciprocally ascend/descent full flight of stairs with U railing, minimal unsteadiness, good eccentric control in order to allow him to access the community    Baseline 10/29/2015:  Step to pattern; 11/10/15:   Status Not Met     PT LONG TERM GOAL #4   Title Patient to report he has been able to retun to regular exercise program, at least 30 minutes in duration, at least 4 days per week, in order to [facility] functional  gains and improve overall health status    Baseline 10/29/2015:  Reports going to gym 2-3 days a week working on upper body strength; 11/10/15: going 2-3x/week to work on UE only    Status Partially Met  Plan - 11/17/15 0859    Clinical Impression Statement Continued focus on functional stretching, manual for ROM/edema, and also incorporated dynamic balance techniques today; patient continues to report that he is doing well overall with no major problems or concerns besides his knee remaining stiff. Measured B knee ROM today with L knee lacking approximately 3 degrees extension from R however approximately 19 degrees flexion compared to R; continue to attribute this to soft tissue/scar tissue as well as edema remaining in knee- note that order for compression stocking was sent to MD last session. Also note that despite ROM deficits patient reports high level of function post-surgery.    Rehab Potential Good   PT Frequency 2x / week   PT Duration 4 weeks   PT Treatment/Interventions ADLs/Self Care Home Management;Cryotherapy;Moist Heat;DME Instruction;Gait training;Stair training;Functional mobility training;Therapeutic activities;Therapeutic exercise;Balance training;Neuromuscular re-education;Patient/family education;Manual techniques;Scar mobilization;Passive range of motion;Energy conservation;Taping   PT Next Visit Plan continue manual for ROM/edema; advance balance tasks and make bike more regular activity    Consulted and Agree with Plan of Care Patient      Patient will benefit from skilled therapeutic intervention in order to improve the following deficits and impairments:  Abnormal gait, Decreased skin integrity, Hypomobility, Decreased scar mobility, Increased edema, Decreased activity tolerance, Decreased strength, Pain, Decreased balance, Decreased mobility, Difficulty walking, Decreased range of motion, Improper body mechanics, Decreased coordination, Impaired  flexibility, Postural dysfunction  Visit Diagnosis: Stiffness of left knee, not elsewhere classified  Localized edema  Muscle weakness (generalized)  Difficulty in walking, not elsewhere classified     Problem List Patient Active Problem List   Diagnosis Date Noted  . Essential hypertension 09/08/2015  . Macular degeneration 09/08/2015  . IBS (irritable bowel syndrome) 09/08/2015  . Dyslipidemia 09/08/2015  . Primary osteoarthritis of left knee   . ACHILLES TENDINITIS 10/02/2009  . RUPTURE ROTATOR CUFF 03/25/2009  . IMPINGEMENT SYNDROME 03/06/2009    Deniece Ree PT, DPT [facility] [address] [place], [place], [address] Phone: [phone]   Fax:  [phone]  Name: CORDARIOUS ZEEK MRN: [id number] Date of Birth: 1927-12-18

## 2015-11-20 ENCOUNTER — Ambulatory Visit (HOSPITAL_COMMUNITY): Payer: Medicare Other | Admitting: Physical Therapy

## 2015-11-20 DIAGNOSIS — M25662 Stiffness of left knee, not elsewhere classified: Secondary | ICD-10-CM | POA: Diagnosis not present

## 2015-11-20 DIAGNOSIS — M6281 Muscle weakness (generalized): Secondary | ICD-10-CM

## 2015-11-20 DIAGNOSIS — R600 Localized edema: Secondary | ICD-10-CM

## 2015-11-20 DIAGNOSIS — R262 Difficulty in walking, not elsewhere classified: Secondary | ICD-10-CM | POA: Diagnosis not present

## 2015-11-20 NOTE — Therapy (Signed)
[facility] [address] [place], [place], [address] Phone: [phone]   Fax:  [phone]  Physical Therapy Treatment  Patient Details  Name: Christopher Warner MRN: [id number] Date of Birth: 1927-12-06 Referring Provider: Arther Abbott, MD  Encounter Date: 11/20/2015      PT End of Session - 11/20/15 1034    Visit Number 17   Number of Visits 24   Date for PT Re-Evaluation 12/08/15   Authorization Type [employer] Medicare (G-codes done 14th visit)   Authorization Time Period 09/29/15 to 11/10/15 NEW: 11/11/15 to 12/08/15   Authorization - Visit Number 55   Authorization - Number of Visits 24   PT Start Time 0950   PT Stop Time 1030   PT Time Calculation (min) 40 min   Activity Tolerance Patient tolerated treatment well   Behavior During Therapy [facility] for tasks assessed/performed      Past Medical History:  Diagnosis Date  . Arthritis   . Dyslipidemia   . Hypertension   . IBS (irritable bowel syndrome)    With loose stool  . Macular degeneration disease     Past Surgical History:  Procedure Laterality Date  . back surgeries  x   x 3  . ELBOW SURGERY    . KNEE CARTILAGE SURGERY    . SHOULDER SURGERY     rotator cuff repair (rt)  . TOE SURGERY    . TONSILLECTOMY    . TOTAL KNEE ARTHROPLASTY Left 09/03/2015   Procedure: TOTAL KNEE ARTHROPLASTY;  Surgeon: Carole Civil, MD;  Location: [facility];  Service: Orthopedics;  Laterality: Left;  . YAG LASER APPLICATION Right 07/04/2705   Procedure: YAG LASER APPLICATION;  Surgeon: Williams Che, MD;  Location: [facility];  Service: Ophthalmology;  Laterality: Right;    There were no vitals filed for this visit.                       [facility] Adult PT Treatment/Exercise - 11/20/15 0001      Knee/Hip Exercises: Stretches   Active Hamstring Stretch 3 reps;30 seconds   Active Hamstring Stretch Limitations 12 inch box    Knee: Self-Stretch Limitations knee drives 86L 10" holds  on 12in step   Gastroc Stretch Both;3 reps;30 seconds   Gastroc Stretch Limitations slant board     Knee/Hip Exercises: Standing   Knee Flexion Left;15 reps   Knee Flexion Limitations TKF on 6" box   Other Standing Knee Exercises tandem stance Lt LE behind with TKE 3x 30"     Knee/Hip Exercises: Supine   Quad Sets Left;20 reps   Short Arc [employer] 15 reps   Short Arc Quad Sets Limitations 3" holds   Heel Slides AAROM;Left;1 set;5 reps   Heel Slides Limitations 10 sec hold    Knee Extension AROM;AAROM;PROM   Knee Extension Limitations 10   Knee Flexion AROM;AAROM;PROM   Knee Flexion Limitations 104     Manual Therapy   Manual Therapy Myofascial release;Soft tissue mobilization   Manual therapy comments Manual complete separate rest of treatment    Soft tissue mobilization scar tissue massage   Myofascial Release adhesions and scar tissue surrounding knee, entire perimeter   Passive ROM flexion and extension                   PT Short Term Goals - 11/10/15 0920      PT SHORT TERM GOAL #1   Title Patient will  demonstrate L knee ROM of 8-100 degrees in order to improve mechanics and reduce discomfort   Baseline 10/29/2015:  AROM 6-98 degrees; 11/10/15: AROM 10-86 degrees   Status Partially Met     PT SHORT TERM GOAL #2   Title Patient to be able to ambulate unlimited distances with SPC with pain L knee no more than 2/10 in order to improve mobility and QOL    Baseline 10/29/2015:  Ambulates with no AD for 35-40 minutes with no pain; 11/10/15: only using SPC when going outdoors, 4/10 max pain, unsure how far he can go, but doesn't feel like he is limited   Status Achieved     PT SHORT TERM GOAL #3   Title Patient to be independent in correctly and consistently applying ice, performing scar massage, and regular skin care of L knee in order to assist in improving condition    Baseline 10/29/2015:  Reports has been completeing manual techniques, ice application for pain  and edema; 11/10/15: "occasionally icing/elevating knee"   Status Partially Met     PT SHORT TERM GOAL #4   Title Patient to be independent in correctly and consistently performign appropriate HEP, to be updated PRN    Baseline 10/29/2015:  Reports compliance wiht HEP daily; 11/10/15: non-compliant with knee flexion stretches, but reports he is performing his other exercises   Status Partially Met           PT Long Term Goals - 11/10/15 0924      PT LONG TERM GOAL #1   Title Patient to demonstrate L knee ROM 0-110 degrees in order to improve mechanics and  general gait and mobility   Baseline 11/10/15:    Status Not Met     PT LONG TERM GOAL #2   Title Patient to be able to ambulate unlimited distances with no assistive device, correct mechanics including full heel-toe pattern, equal step/stance lengths/times, upright posture in order to allow him to access community    Baseline 10/29/2015: decreased stance time, knee flexion noted during mid stance; 11/10/15: same    Status On-going     PT LONG TERM GOAL #3   Title Patient to be able to reciprocally ascend/descent full flight of stairs with U railing, minimal unsteadiness, good eccentric control in order to allow him to access the community    Baseline 10/29/2015:  Step to pattern; 11/10/15:   Status Not Met     PT LONG TERM GOAL #4   Title Patient to report he has been able to retun to regular exercise program, at least 30 minutes in duration, at least 4 days per week, in order to [facility] functional gains and improve overall health status    Baseline 10/29/2015:  Reports going to gym 2-3 days a week working on upper body strength; 11/10/15: going 2-3x/week to work on UE only    Status Partially Met               Plan - 11/20/15 1035    Clinical Impression Statement Focus on improving ROM Lt knee this session.  Signed order received for compression stocking.  Pt given order as well as list of businesses to obtain garment.   Instructed to call and make an appoitnment.  spent majority of time on myofascial and scar techniques as patient with extreme tightness at distal quad/proximal scar anteriorly.  Able to achieve 5 more degrees extension and flexion at EOS.     Rehab Potential Good   PT Frequency 2x /  week   PT Duration 4 weeks   PT Treatment/Interventions ADLs/Self Care Home Management;Cryotherapy;Moist Heat;DME Instruction;Gait training;Stair training;Functional mobility training;Therapeutic activities;Therapeutic exercise;Balance training;Neuromuscular re-education;Patient/family education;Manual techniques;Scar mobilization;Passive range of motion;Energy conservation;Taping   PT Next Visit Plan continue manual to decrease adhesions/scar tissue, edema as needed.  Add balance actvities and finish session with bike for added ROM.   Consulted and Agree with Plan of Care Patient      Patient will benefit from skilled therapeutic intervention in order to improve the following deficits and impairments:  Abnormal gait, Decreased skin integrity, Hypomobility, Decreased scar mobility, Increased edema, Decreased activity tolerance, Decreased strength, Pain, Decreased balance, Decreased mobility, Difficulty walking, Decreased range of motion, Improper body mechanics, Decreased coordination, Impaired flexibility, Postural dysfunction  Visit Diagnosis: Stiffness of left knee, not elsewhere classified  Localized edema  Muscle weakness (generalized)  Difficulty in walking, not elsewhere classified     Problem List Patient Active Problem List   Diagnosis Date Noted  . Essential hypertension 09/08/2015  . Macular degeneration 09/08/2015  . IBS (irritable bowel syndrome) 09/08/2015  . Dyslipidemia 09/08/2015  . Primary osteoarthritis of left knee   . ACHILLES TENDINITIS 10/02/2009  . RUPTURE ROTATOR CUFF 03/25/2009  . IMPINGEMENT SYNDROME 03/06/2009    Teena Irani, PTA/CLT [phone]  11/20/2015, 10:40  AM  [facility] [address] [place], [place], [address] Phone: [phone]   Fax:  [phone]  Name: DERIAN DIMALANTA MRN: [id number] Date of Birth: Oct 13, 1927

## 2015-11-24 ENCOUNTER — Ambulatory Visit (HOSPITAL_COMMUNITY): Payer: Medicare Other | Admitting: Physical Therapy

## 2015-11-24 DIAGNOSIS — R600 Localized edema: Secondary | ICD-10-CM | POA: Diagnosis not present

## 2015-11-24 DIAGNOSIS — R262 Difficulty in walking, not elsewhere classified: Secondary | ICD-10-CM

## 2015-11-24 DIAGNOSIS — M25662 Stiffness of left knee, not elsewhere classified: Secondary | ICD-10-CM | POA: Diagnosis not present

## 2015-11-24 DIAGNOSIS — M6281 Muscle weakness (generalized): Secondary | ICD-10-CM

## 2015-11-24 NOTE — Therapy (Signed)
[facility] [address] [place], [place], [address] Phone: [phone]   Fax:  [phone]  Physical Therapy Treatment  Patient Details  Name: Christopher Warner MRN: [id number] Date of Birth: 1927/10/31 Referring Provider: Arther Abbott, MD  Encounter Date: 11/24/2015      PT End of Session - 11/24/15 0943    Visit Number 18   Number of Visits 24   Date for PT Re-Evaluation 12/08/15   Authorization Type [employer] Medicare (G-codes done 14th visit)   Authorization Time Period 09/29/15 to 11/10/15 NEW: 11/11/15 to 12/08/15   Authorization - Visit Number 94   Authorization - Number of Visits 24   PT Start Time 0901   PT Stop Time 0939   PT Time Calculation (min) 38 min   Equipment Utilized During Treatment Gait belt   Activity Tolerance Patient tolerated treatment well   Behavior During Therapy [facility] for tasks assessed/performed      Past Medical History:  Diagnosis Date  . Arthritis   . Dyslipidemia   . Hypertension   . IBS (irritable bowel syndrome)    With loose stool  . Macular degeneration disease     Past Surgical History:  Procedure Laterality Date  . back surgeries  x   x 3  . ELBOW SURGERY    . KNEE CARTILAGE SURGERY    . SHOULDER SURGERY     rotator cuff repair (rt)  . TOE SURGERY    . TONSILLECTOMY    . TOTAL KNEE ARTHROPLASTY Left 09/03/2015   Procedure: TOTAL KNEE ARTHROPLASTY;  Surgeon: Carole Civil, MD;  Location: [facility];  Service: Orthopedics;  Laterality: Left;  . YAG LASER APPLICATION Right 06/04/4534   Procedure: YAG LASER APPLICATION;  Surgeon: Williams Che, MD;  Location: [facility];  Service: Ophthalmology;  Laterality: Right;    There were no vitals filed for this visit.      Subjective Assessment - 11/24/15 0904    Subjective Pt reports that his knee is just a little sore today. He does not have any other complaints.    Pertinent History HTN, macular degeneration, history of several back surgeries     Currently in Pain? Yes   Pain Score 3   Lt knee soreness             [facility] PT Assessment - 11/24/15 0001      AROM   Left Knee Flexion 105  post manual                      [facility] Adult PT Treatment/Exercise - 11/24/15 0001      Knee/Hip Exercises: Standing   Heel Raises Left;2 sets;15 reps   Knee Flexion Left;1 set;10 reps   Knee Flexion Limitations 10 sec hold      Knee/Hip Exercises: Seated   Sit to Sand 3 sets;10 reps;without UE support     Manual Therapy   Manual Therapy Soft tissue mobilization;Joint mobilization   Manual therapy comments Manual complete separate rest of treatment    Joint Mobilization Grade III-IV Lt fibular mobs, MWM Lt knee flexion in supine, seated medial tibiofemoral glides at end range knee flexion   Myofascial Release lateral knee, distal quad             Balance Exercises - 11/24/15 0941      Balance Exercises: Standing   SLS Eyes open;2 reps;30 secs;Solid surface   Tandem Gait Forward;3 reps;Other (comment)  x2 LOB,  CGA           PT Education - 11/24/15 [phone]    Education provided Yes   Education Details technique with therex, improvements in ROM post manual    Person(s) Educated Patient   Methods Explanation   Comprehension Verbalized understanding          PT Short Term Goals - 11/10/15 0920      PT SHORT TERM GOAL #1   Title Patient will demonstrate L knee ROM of 8-100 degrees in order to improve mechanics and reduce discomfort   Baseline 10/29/2015:  AROM 6-98 degrees; 11/10/15: AROM 10-86 degrees   Status Partially Met     PT SHORT TERM GOAL #2   Title Patient to be able to ambulate unlimited distances with SPC with pain L knee no more than 2/10 in order to improve mobility and QOL    Baseline 10/29/2015:  Ambulates with no AD for 35-40 minutes with no pain; 11/10/15: only using SPC when going outdoors, 4/10 max pain, unsure how far he can go, but doesn't feel like he is limited   Status Achieved      PT SHORT TERM GOAL #3   Title Patient to be independent in correctly and consistently applying ice, performing scar massage, and regular skin care of L knee in order to assist in improving condition    Baseline 10/29/2015:  Reports has been completeing manual techniques, ice application for pain and edema; 11/10/15: "occasionally icing/elevating knee"   Status Partially Met     PT SHORT TERM GOAL #4   Title Patient to be independent in correctly and consistently performign appropriate HEP, to be updated PRN    Baseline 10/29/2015:  Reports compliance wiht HEP daily; 11/10/15: non-compliant with knee flexion stretches, but reports he is performing his other exercises   Status Partially Met           PT Long Term Goals - 11/10/15 0924      PT LONG TERM GOAL #1   Title Patient to demonstrate L knee ROM 0-110 degrees in order to improve mechanics and  general gait and mobility   Baseline 11/10/15:    Status Not Met     PT LONG TERM GOAL #2   Title Patient to be able to ambulate unlimited distances with no assistive device, correct mechanics including full heel-toe pattern, equal step/stance lengths/times, upright posture in order to allow him to access community    Baseline 10/29/2015: decreased stance time, knee flexion noted during mid stance; 11/10/15: same    Status On-going     PT LONG TERM GOAL #3   Title Patient to be able to reciprocally ascend/descent full flight of stairs with U railing, minimal unsteadiness, good eccentric control in order to allow him to access the community    Baseline 10/29/2015:  Step to pattern; 11/10/15:   Status Not Met     PT LONG TERM GOAL #4   Title Patient to report he has been able to retun to regular exercise program, at least 30 minutes in duration, at least 4 days per week, in order to [facility] functional gains and improve overall health status    Baseline 10/29/2015:  Reports going to gym 2-3 days a week working on upper body strength;  11/10/15: going 2-3x/week to work on UE only    Status Partially Met               Plan - 11/24/15 0943    Clinical Impression Statement  Today's session continued to focus on improving knee flexion ROM and functional strength/balance. Pt with noted improvements in ROM from his last session to 105 degrees after manual techniques were performed. Will continue with current POC.    Rehab Potential Good   PT Frequency 2x / week   PT Duration 4 weeks   PT Treatment/Interventions ADLs/Self Care Home Management;Cryotherapy;Moist Heat;DME Instruction;Gait training;Stair training;Functional mobility training;Therapeutic activities;Therapeutic exercise;Balance training;Neuromuscular re-education;Patient/family education;Manual techniques;Scar mobilization;Passive range of motion;Energy conservation;Taping   PT Next Visit Plan continue manual to decrease adhesions/scar tissue, edema as needed. fucntional strength progressions.    PT Home Exercise Plan seated knee flexion stretch, heel slide knee flexion stretch   Consulted and Agree with Plan of Care Patient      Patient will benefit from skilled therapeutic intervention in order to improve the following deficits and impairments:  Abnormal gait, Decreased skin integrity, Hypomobility, Decreased scar mobility, Increased edema, Decreased activity tolerance, Decreased strength, Pain, Decreased balance, Decreased mobility, Difficulty walking, Decreased range of motion, Improper body mechanics, Decreased coordination, Impaired flexibility, Postural dysfunction  Visit Diagnosis: Stiffness of left knee, not elsewhere classified  Localized edema  Muscle weakness (generalized)  Difficulty in walking, not elsewhere classified     Problem List Patient Active Problem List   Diagnosis Date Noted  . Essential hypertension 09/08/2015  . Macular degeneration 09/08/2015  . IBS (irritable bowel syndrome) 09/08/2015  . Dyslipidemia 09/08/2015  . Primary  osteoarthritis of left knee   . ACHILLES TENDINITIS 10/02/2009  . RUPTURE ROTATOR CUFF 03/25/2009  . IMPINGEMENT SYNDROME 03/06/2009    11:44 AM,11/24/15 Elly Modena PT, DPT [facility] [address] [place], [place], [address] Phone: [phone]   Fax:  [phone]  Name: Christopher Warner MRN: [id number] Date of Birth: [birth year]

## 2015-11-26 ENCOUNTER — Ambulatory Visit (HOSPITAL_COMMUNITY): Payer: Medicare Other

## 2015-11-26 DIAGNOSIS — R262 Difficulty in walking, not elsewhere classified: Secondary | ICD-10-CM

## 2015-11-26 DIAGNOSIS — M25662 Stiffness of left knee, not elsewhere classified: Secondary | ICD-10-CM

## 2015-11-26 DIAGNOSIS — R600 Localized edema: Secondary | ICD-10-CM

## 2015-11-26 DIAGNOSIS — M6281 Muscle weakness (generalized): Secondary | ICD-10-CM

## 2015-11-26 NOTE — Therapy (Signed)
[facility] [address] [place], [place], [address] Phone: [phone]   Fax:  [phone]  Physical Therapy Treatment  Patient Details  Name: Christopher Warner MRN: [id number] Date of Birth: [birth year] Referring Provider: Arther Abbott  Encounter Date: 11/26/2015      PT End of Session - 11/26/15 0956    Visit Number 19   Number of Visits 24   Date for PT Re-Evaluation 12/08/15   Authorization Type [employer] Medicare (G-codes done 14th visit)   Authorization Time Period 09/29/15 to 11/10/15 NEW: 11/11/15 to 12/08/15   Authorization - Visit Number 45   Authorization - Number of Visits 24   PT Start Time 7824   PT Stop Time 1033   PT Time Calculation (min) 46 min   Activity Tolerance Patient tolerated treatment well   Behavior During Therapy [facility] for tasks assessed/performed      Past Medical History:  Diagnosis Date  . Arthritis   . Dyslipidemia   . Hypertension   . IBS (irritable bowel syndrome)    With loose stool  . Macular degeneration disease     Past Surgical History:  Procedure Laterality Date  . back surgeries  x   x 3  . ELBOW SURGERY    . KNEE CARTILAGE SURGERY    . SHOULDER SURGERY     rotator cuff repair (rt)  . TOE SURGERY    . TONSILLECTOMY    . TOTAL KNEE ARTHROPLASTY Left 09/03/2015   Procedure: TOTAL KNEE ARTHROPLASTY;  Surgeon: Carole Civil, MD;  Location: [facility];  Service: Orthopedics;  Laterality: Left;  . YAG LASER APPLICATION Right 05/02/5359   Procedure: YAG LASER APPLICATION;  Surgeon: Williams Che, MD;  Location: [facility];  Service: Ophthalmology;  Laterality: Right;    There were no vitals filed for this visit.      Subjective Assessment - 11/26/15 0946    Subjective Pt stated knee is a little sore and stiff today, pain minimal 3/10   Pertinent History HTN, macular degeneration, history of several back surgeries    How long can you sit comfortably? unlimited    How long can you stand  comfortably? at least 1 hour   How long can you walk comfortably? doesn't feel limited with this   Patient Stated Goals get back to PLOF    Currently in Pain? Yes   Pain Score 3    Pain Location Knee   Pain Orientation Left   Pain Descriptors / Indicators Sore;Tightness   Pain Type Surgical pain   Pain Radiating Towards none   Pain Onset More than a month ago   Pain Frequency Constant   Aggravating Factors  not sure   Pain Relieving Factors nothing   Effect of Pain on Daily Activities nothing more than usual            [facility] PT Assessment - 11/26/15 0001      Assessment   Medical Diagnosis L TKR    Referring Provider Arther Abbott   Onset Date/Surgical Date 09/03/15   Next MD Visit 11/28/2015     Observation/Other Assessments   Observations L knee appears to have increased edema, however no acute discoloration or other possible DVT  symptomology noted today    Skin Integrity incision appears well healing at this imte with no acute signs of inflammation or infection     AROM   Right Knee Extension 6   Right Knee Flexion 114   Left  Knee Extension 7   Left Knee Flexion 106     Strength   Right Hip Flexion 5/5   Right Hip Extension 4/5  was 4/5   Right Hip ABduction 5/5   Left Hip Flexion 5/5   Left Hip Extension 4/5  was 4-/5   Left Hip ABduction 5/5   Right Knee Flexion 5/5   Right Knee Extension 5/5   Left Knee Flexion 5/5   Left Knee Extension 5/5   Right Ankle Dorsiflexion 5/5   Left Ankle Dorsiflexion 5/5                     [facility] Adult PT Treatment/Exercise - 11/26/15 0001      Knee/Hip Exercises: Stretches   Active Hamstring Stretch 3 reps;30 seconds   Active Hamstring Stretch Limitations supine with rope   Quad Stretch Left;3 reps;30 seconds   Quad Stretch Limitations prone with rope   Knee: Self-Stretch Limitations knee drives 25W 10" holds on 12in step     Knee/Hip Exercises: Standing   Heel Raises 15 reps   Heel Raises  Limitations heel and toe raises   Knee Flexion Left;1 set;10 reps   Knee Flexion Limitations 10 sec hold    Stairs 4RT reciprocal pattern 1HR cueing for posture and eccentric control   SLS Lt 27', Rt 18"   Other Standing Knee Exercises tandem stance on airex 2x 30"     Knee/Hip Exercises: Supine   Quad Sets Left;20 reps   Short Arc Quad Sets 15 reps   Heel Slides AAROM;Left;1 set;15 reps   Heel Slides Limitations 10 sec hold      Manual Therapy   Manual Therapy Soft tissue mobilization;Joint mobilization   Manual therapy comments Manual complete separate rest of treatment    Joint Mobilization Grade III-IV Lt fibular mobs, MWM Lt knee flexion in supine, seated medial tibiofemoral glides at end range knee flexion   Myofascial Release lateral knee, distal quad                  PT Short Term Goals - 11/26/15 1008      PT SHORT TERM GOAL #1   Title Patient will demonstrate L knee ROM of 8-100 degrees in order to improve mechanics and reduce discomfort   Baseline 11/26/2015: AROM 7-106 degrees   Status Achieved     PT SHORT TERM GOAL #2   Title Patient to be able to ambulate unlimited distances with SPC with pain L knee no more than 2/10 in order to improve mobility and QOL    Baseline 10/29/2015:  Ambulates with no AD for 35-40 minutes with no pain; 11/10/15: only using SPC when going outdoors, 4/10 max pain, unsure how far he can go, but doesn't feel like he is limited   Status Achieved     PT SHORT TERM GOAL #3   Title Patient to be independent in correctly and consistently applying ice, performing scar massage, and regular skin care of L knee in order to assist in improving condition    Baseline 10/29/2015:  Reports has been completeing manual techniques, ice application for pain and edema; 11/10/15: "occasionally icing/elevating knee" 11/26/2015:  Reports compliance with ice 2x/ daily, using rolling pen for scar massage and lotion.  Plans to pick up compression hose from  [place] tomorrow   Status Partially Met     PT [place] #4   Title Patient to be independent in correctly and consistently performign appropriate HEP,  to be updated PRN    Baseline 11/26/2015:  Reports compliance daily wiht ROM based exercises   Status Achieved           PT Long Term Goals - 11/26/15 1011      PT LONG TERM GOAL #1   Title Patient to demonstrate L knee ROM 0-110 degrees in order to improve mechanics and  general gait and mobility   Baseline 11/26/2015:  7-106 degrees   Status On-going     PT LONG TERM GOAL #2   Title Patient to be able to ambulate unlimited distances with no assistive device, correct mechanics including full heel-toe pattern, equal step/stance lengths/times, upright posture in order to allow him to access community    Baseline 10/29/2015: decreased stance time, knee flexion noted during mid stance; 11/10/15: same    Status On-going     PT LONG TERM GOAL #3   Title Patient to be able to reciprocally ascend/descent full flight of stairs with U railing, minimal unsteadiness, good eccentric control in order to allow him to access the community    Baseline 11/26/2015:  Able to complete reciprocal pattern ascend/descent with Uni railing, minimal unsteadiness, cueing to improve posture and improve eccentric control.  Reports completeing stairs at church   Status Partially Met     PT LONG TERM GOAL #4   Title Patient to report he has been able to retun to regular exercise program, at least 30 minutes in duration, at least 4 days per week, in order to [facility] functional gains and improve overall health status    Baseline 11/26/2015:  Reports going to gym Mon-Fri working on UE strength   Status Achieved               Plan - 11/26/15 1015    Clinical Impression Statement Continued session focus on improving knee ROM both directions and functional strengthening/balance training.  Pt able to achieve ROM 7-106 degrees following manual  this session.  Pt does continue to exhibit hard end feels with ROM and scar tissue adhesions limiting range.  Pt reports he has plans to purchase compression hose from [facility] [employer] to assist with edema control, reviewed application and frequency to wear hose.  Strength is progressing well.  Reviewed goals with 3/4 STGs acheived and progressings towards LTGs.  Pt able to demonstrate reciprocal pattern stair training with 1 HR and minimal unsteadiness, did require cueing for eccentric control and posture awareness.  Continued balance training with CGA for safety.  No reports of pain through session.     Rehab Potential Good   PT Frequency 2x / week   PT Duration 4 weeks   PT Treatment/Interventions ADLs/Self Care Home Management;Cryotherapy;Moist Heat;DME Instruction;Gait training;Stair training;Functional mobility training;Therapeutic activities;Therapeutic exercise;Balance training;Neuromuscular re-education;Patient/family education;Manual techniques;Scar mobilization;Passive range of motion;Energy conservation;Taping   PT Next Visit Plan continue manual to decrease adhesions/scar tissue, edema as needed. fucntional strength progressions.    PT Home Exercise Plan seated knee flexion stretch, heel slide knee flexion stretch      Patient will benefit from skilled therapeutic intervention in order to improve the following deficits and impairments:  Abnormal gait, Decreased skin integrity, Hypomobility, Decreased scar mobility, Increased edema, Decreased activity tolerance, Decreased strength, Pain, Decreased balance, Decreased mobility, Difficulty walking, Decreased range of motion, Improper body mechanics, Decreased coordination, Impaired flexibility, Postural dysfunction  Visit Diagnosis: Stiffness of left knee, not elsewhere classified  Localized edema  Muscle weakness (generalized)  Difficulty in walking, not elsewhere classified  Problem List Patient Active Problem List    Diagnosis Date Noted  . Essential hypertension 09/08/2015  . Macular degeneration 09/08/2015  . IBS (irritable bowel syndrome) 09/08/2015  . Dyslipidemia 09/08/2015  . Primary osteoarthritis of left knee   . ACHILLES TENDINITIS 10/02/2009  . RUPTURE ROTATOR CUFF 03/25/2009  . IMPINGEMENT SYNDROME 03/06/2009   Ihor Austin, LPTA; [place]  Aldona Lento 11/26/2015, 10:46 AM  [place], [place], [address] Phone: [phone]   Fax:  [phone]  Name: Christopher Warner MRN: [id number] Date of Birth: Sep 23, 1927

## 2015-11-28 ENCOUNTER — Encounter: Payer: Self-pay | Admitting: Orthopedic Surgery

## 2015-11-28 ENCOUNTER — Ambulatory Visit (INDEPENDENT_AMBULATORY_CARE_PROVIDER_SITE_OTHER): Payer: Medicare Other | Admitting: Orthopedic Surgery

## 2015-11-28 VITALS — BP 150/76 | HR 61 | Ht 68.0 in | Wt 171.0 lb

## 2015-11-28 DIAGNOSIS — Z96652 Presence of left artificial knee joint: Secondary | ICD-10-CM

## 2015-11-28 DIAGNOSIS — Z4789 Encounter for other orthopedic aftercare: Secondary | ICD-10-CM

## 2015-11-28 NOTE — Progress Notes (Signed)
Patient ID: Christopher Warner, male   DOB: [birth year], 82 y.o.   MRN: [id number]  Post op visit   Chief Complaint  Patient presents with  . Follow-up    Recheck on left total knee replacement. DOS 09-13-15.    BP (!) 150/76   Pulse 61   Ht 5\' 8"  (1.727 m)   Wt 171 lb (77.6 kg)   BMI 26.00 kg/m     Months left total knee doing well in terms of pain still has some stiffness. Has a flexion contracture on the left is about 8 on the right is 5 he has 110 of knee flexion on the right, 105 on the left    ASSESSMENT AND PLAN   PT NOTES  AROM   Right Knee Extension 6  Right Knee Flexion 114  Left Knee Extension 7  Left Knee Flexion 106    Encounter Diagnoses  Name Primary?  . Surgical aftercare, musculoskeletal system Yes  . Status post total left knee replacement     Return 3 months continue to work on flexion

## 2015-12-02 ENCOUNTER — Ambulatory Visit (HOSPITAL_COMMUNITY): Payer: Medicare Other | Attending: Internal Medicine | Admitting: Physical Therapy

## 2015-12-02 DIAGNOSIS — R600 Localized edema: Secondary | ICD-10-CM | POA: Insufficient documentation

## 2015-12-02 DIAGNOSIS — M6281 Muscle weakness (generalized): Secondary | ICD-10-CM | POA: Insufficient documentation

## 2015-12-02 DIAGNOSIS — M25662 Stiffness of left knee, not elsewhere classified: Secondary | ICD-10-CM | POA: Diagnosis not present

## 2015-12-02 DIAGNOSIS — R262 Difficulty in walking, not elsewhere classified: Secondary | ICD-10-CM | POA: Diagnosis not present

## 2015-12-02 NOTE — Therapy (Addendum)
[facility] [address] [place], [place], [address] Phone: [phone]   Fax:  [phone]  Physical Therapy Treatment/Discharge  Patient Details  Name: Christopher Warner MRN: [id number] Date of Birth: 1927-12-10 Referring Provider: Arther Abbott  Encounter Date: 12/02/2015      PT End of Session - 12/02/15 0944    Visit Number 20   Number of Visits 24   Date for PT Re-Evaluation 12/08/15   Authorization Type [employer] (G-codes done 14th visit)   Authorization Time Period 09/29/15 to 11/10/15 NEW: 11/11/15 to 12/08/15   Authorization - Visit Number 40   Authorization - Number of Visits 24   PT Start Time 0902   PT Stop Time 0943   PT Time Calculation (min) 41 min   Activity Tolerance Patient tolerated treatment well   Behavior During Therapy [facility] for tasks assessed/performed      Past Medical History:  Diagnosis Date  . Arthritis   . Dyslipidemia   . Hypertension   . IBS (irritable bowel syndrome)    With loose stool  . Macular degeneration disease     Past Surgical History:  Procedure Laterality Date  . back surgeries  x   x 3  . ELBOW SURGERY    . KNEE CARTILAGE SURGERY    . SHOULDER SURGERY     rotator cuff repair (rt)  . TOE SURGERY    . TONSILLECTOMY    . TOTAL KNEE ARTHROPLASTY Left 09/03/2015   Procedure: TOTAL KNEE ARTHROPLASTY;  Surgeon: Carole Civil, MD;  Location: [facility];  Service: Orthopedics;  Laterality: Left;  . YAG LASER APPLICATION Right 06/27/6604   Procedure: YAG LASER APPLICATION;  Surgeon: Williams Che, MD;  Location: [facility];  Service: Ophthalmology;  Laterality: Right;    There were no vitals filed for this visit.      Subjective Assessment - 12/02/15 0906    Subjective Pt reports things are going good. He saw Dr. Aline Brochure and he said things are looking good as well. He got compression garments on Friday and thinks it has helped with the swelling. No other complaints.    How long can  you sit comfortably? unlimited    How long can you stand comfortably? unlimited    How long can you walk comfortably? unlimited    Patient Stated Goals get back to PLOF    Currently in Pain? No/denies            [facility] PT Assessment - 12/02/15 0001      Observation/Other Assessments   Other Surveys  Other Surveys   Lower Extremity Functional Scale  76/80     AROM   Right Knee Flexion 112   Left Knee Extension 9   Left Knee Flexion 109  post session                     [facility] Adult PT Treatment/Exercise - 12/02/15 0001      Knee/Hip Exercises: Stretches   Active Hamstring Stretch Left;30 seconds;4 reps   Active Hamstring Stretch Limitations standing    Knee: Self-Stretch to increase Flexion Left;10 seconds   Knee: Self-Stretch Limitations 20 reps      Knee/Hip Exercises: Standing   Forward Step Up 2 sets;Left;Hand Hold: 1;Step Height: 6";15 reps     Knee/Hip Exercises: Supine   Bridges Limitations 2x15             Balance Exercises - 12/02/15 [phone]  Balance Exercises: Standing   SLS Eyes open;2 reps;30 secs           PT Education - 12/02/15 1000    Education provided Yes   Education Details Warner/goals; updated HEP and reviewed technique; encouraged daily walking and exercise to continue to improve mobility   Person(s) Educated Patient   Methods Explanation;Demonstration;Handout   Comprehension Verbalized understanding;Returned demonstration          PT Short Term Goals - 12/02/15 0938      PT SHORT TERM GOAL #1   Title Patient will demonstrate L knee ROM of 8-100 degrees in order to improve mechanics and reduce discomfort   Baseline 11/26/2015: AROM 7-106 degrees   12/02/15: 0-9-109 degrees    Status Achieved     PT SHORT TERM GOAL #2   Title Patient to be able to ambulate unlimited distances with SPC with pain L knee no more than 2/10 in order to improve mobility and QOL    Baseline 10/29/2015:  Ambulates with no AD for 35-40  minutes with no pain; 11/10/15: only using SPC when going outdoors, 4/10 max pain, unsure how far he can go, but doesn't feel like he is limited   Status Achieved     PT SHORT TERM GOAL #3   Title Patient to be independent in correctly and consistently applying ice, performing scar massage, and regular skin care of L knee in order to assist in improving condition    Baseline 10/29/2015:  Reports has been completeing manual techniques, ice application for pain and edema; 11/10/15: "occasionally icing/elevating knee" 11/26/2015:  Reports compliance with ice 2x/ daily, using rolling pen for scar massage and lotion.  Plans to pick up compression hose from [place] #4   Title Patient to be independent in correctly and consistently performign appropriate HEP, to be updated PRN    Baseline 11/26/2015:  Reports compliance daily wiht ROM based exercises   Status Achieved           PT Long Term Goals - 12/02/15 0939      PT LONG TERM GOAL #1   Title Patient to demonstrate L knee ROM 0-110 degrees in order to improve mechanics and  general gait and mobility   Baseline 11/26/2015:  7-106 degrees   Status Partially Met     PT LONG TERM GOAL #2   Title Patient to be able to ambulate unlimited distances with no assistive device, correct mechanics including full heel-toe pattern, equal step/stance lengths/times, upright posture in order to allow him to access community    Baseline 10/29/2015: decreased stance time, knee flexion noted during mid stance; 11/10/15: same    Status Achieved     PT LONG TERM GOAL #3   Title Patient to be able to reciprocally ascend/descent full flight of stairs with U railing, minimal unsteadiness, good eccentric control in order to allow him to access the community    Baseline 11/26/2015:  Able to complete reciprocal pattern ascend/descent with Uni railing, minimal unsteadiness, cueing to improve posture and  improve eccentric control.  Reports completeing stairs at church   Status Achieved     PT LONG TERM GOAL #4   Title Patient to report he has been able to retun to regular exercise program, at least 30 minutes in duration, at least 4 days per week, in order to [facility] functional gains and improve overall health status    Baseline 11/26/2015:  Reports going to gym Mon-Fri working on UE strength   Status Achieved               Plan - Dec 27, 2015 1212    Clinical Impression Statement Christopher Warner since beginning PT several weeks ago. Today, he demonstrates knee ROM 0-9-109 degrees on the Lt which is not far from the Rt knee which is also lacking extension and flexion. He demonstrates improved strength, balance and activity tolerance as well. He is able to maintain single leg balance up to 25 sec on each LE without LOB and reports he has returned to all activity without limitation. At this time, his only remaining limitation is his knee ROM which has improved minimally in the past several sessions despite efforts to address with manual techniques and therex. He is pleased with his Warner and is comfortable with d/c at this time so that he can continue to work on his impairments at home. I provided an advanced HEP to address remaining limitations and pt returned correct demonstration of technique.   Rehab Potential Good   PT Frequency 2x / week   PT Duration 4 weeks   PT Treatment/Interventions ADLs/Self Care Home Management;Cryotherapy;Moist Heat;DME Instruction;Gait training;Stair training;Functional mobility training;Therapeutic activities;Therapeutic exercise;Balance training;Neuromuscular re-education;Patient/family education;Manual techniques;Scar mobilization;Passive range of motion;Energy conservation;Taping   PT Next Visit Plan continue manual to decrease adhesions/scar tissue, edema as needed. fucntional strength progressions.    PT Home Exercise Plan hamstring  stretch, standing knee flexion stretch, heel raises, bridge, single leg stance    Consulted and Agree with Plan of Care Patient      Patient will benefit from skilled therapeutic intervention in order to improve the following deficits and impairments:  Abnormal gait, Decreased skin integrity, Hypomobility, Decreased scar mobility, Increased edema, Decreased activity tolerance, Decreased strength, Pain, Decreased balance, Decreased mobility, Difficulty walking, Decreased range of motion, Improper body mechanics, Decreased coordination, Impaired flexibility, Postural dysfunction  Visit Diagnosis: Stiffness of left knee, not elsewhere classified  Localized edema  Muscle weakness (generalized)  Difficulty in walking, not elsewhere classified       G-Codes - 12-27-15 0943    Functional Assessment Tool Used clinical judgement based on assessment of ROM, strength and functional mobility   Functional Limitation Mobility: Walking and moving around   Mobility: Walking and Moving Around Goal Status [phone]) At least 20 percent but less than 40 percent impaired, limited or restricted   Mobility: Walking and Moving Around Discharge Status [phone]) At least 20 percent but less than 40 percent impaired, limited or restricted      Problem List Patient Active Problem List   Diagnosis Date Noted  . Essential hypertension 09/08/2015  . Macular degeneration 09/08/2015  . IBS (irritable bowel syndrome) 09/08/2015  . Dyslipidemia 09/08/2015  . Primary osteoarthritis of left knee   . ACHILLES TENDINITIS 10/02/2009  . RUPTURE ROTATOR CUFF 03/25/2009  . IMPINGEMENT SYNDROME 03/06/2009   PHYSICAL THERAPY DISCHARGE SUMMARY  Visits from Start of Care: 20  Current functional level related to goals / functional outcomes: Improved balance, strength grossly 5/5 except hip ext 4/5 at last assessment. See clinical impression for more details.    Remaining deficits: Limited knee ext/flexion ROM 0-9-109  approx which may continue to improve with provided HEP   Education / Equipment: Advanced HEP Plan: Patient agrees to discharge.  Patient goals were partially met. Patient is being discharged due to being pleased with the current functional level.  ?????  12:18 PM,12/02/15 Elly Modena PT, DPT [facility] [place] [address] [place], [place], [address] Phone: [phone]   Fax:  [phone]  Name: Christopher Warner MRN: [id number] Date of Birth: 1927/05/31   *Addendum to closed episode of care   12:50 PM,12/02/15 Elly Modena PT, DPT [facility] [phone]

## 2015-12-03 DIAGNOSIS — H353221 Exudative age-related macular degeneration, left eye, with active choroidal neovascularization: Secondary | ICD-10-CM | POA: Diagnosis not present

## 2015-12-04 ENCOUNTER — Encounter (HOSPITAL_COMMUNITY): Payer: Medicare Other | Admitting: Physical Therapy

## 2015-12-08 DIAGNOSIS — E782 Mixed hyperlipidemia: Secondary | ICD-10-CM | POA: Diagnosis not present

## 2015-12-10 DIAGNOSIS — Z0000 Encounter for general adult medical examination without abnormal findings: Secondary | ICD-10-CM | POA: Diagnosis not present

## 2015-12-10 DIAGNOSIS — S88012D Complete traumatic amputation at knee level, left lower leg, subsequent encounter: Secondary | ICD-10-CM | POA: Diagnosis not present

## 2015-12-10 DIAGNOSIS — E782 Mixed hyperlipidemia: Secondary | ICD-10-CM | POA: Diagnosis not present

## 2015-12-10 DIAGNOSIS — I10 Essential (primary) hypertension: Secondary | ICD-10-CM | POA: Diagnosis not present

## 2016-02-17 DIAGNOSIS — H353221 Exudative age-related macular degeneration, left eye, with active choroidal neovascularization: Secondary | ICD-10-CM | POA: Diagnosis not present

## 2016-02-27 ENCOUNTER — Ambulatory Visit (INDEPENDENT_AMBULATORY_CARE_PROVIDER_SITE_OTHER): Payer: Medicare Other | Admitting: Orthopedic Surgery

## 2016-02-27 ENCOUNTER — Encounter: Payer: Self-pay | Admitting: Orthopedic Surgery

## 2016-02-27 DIAGNOSIS — Z96652 Presence of left artificial knee joint: Secondary | ICD-10-CM | POA: Diagnosis not present

## 2016-02-27 NOTE — Progress Notes (Signed)
Patient ID: Christopher CatalanVester H Duren, male   DOB: 26-Apr-1927, 82 y.o.   MRN: [id number]  Chief Complaint  Patient presents with  . Follow-up    Left TKA, DOS 08/28/15    HPI Christopher Warner is a 82 y.o. male.   HPI 6 months postop left total knee doing well  Review of Systems Review of Systems Normal neuro  Denies fever  Examination There were no vitals taken for this visit.  Gen. appearance the patient's appearance is normal with normal grooming and  hygiene The patient is oriented to person place and time Mood and affect are normal   Ortho Exam Gait is remarkable for he has a good gait he walks with a crouched gait no limp Inspection reveals well-healed incision no swelling in the joint ROM is 5-1 05 Stability tests are normal  Motor exam 5/5 manual muscle testing , no atrophy  Skin is normal (no rash or erythema)    Medical decision-making Diagnosis, Data, Plan (risk)  Encounter Diagnosis  Name Primary?  . Status post total left knee replacement Yes   Return 6 months for x-ray  Christopher CanadaStanley Lache Dagher, MD 02/27/2016 8:54 AM

## 2016-04-12 ENCOUNTER — Ambulatory Visit (INDEPENDENT_AMBULATORY_CARE_PROVIDER_SITE_OTHER): Payer: Medicare Other | Admitting: Orthopedic Surgery

## 2016-04-12 ENCOUNTER — Encounter: Payer: Self-pay | Admitting: Orthopedic Surgery

## 2016-04-12 ENCOUNTER — Ambulatory Visit (INDEPENDENT_AMBULATORY_CARE_PROVIDER_SITE_OTHER): Payer: Medicare Other

## 2016-04-12 VITALS — BP 151/84 | HR 65 | Ht 70.0 in | Wt 188.0 lb

## 2016-04-12 DIAGNOSIS — M1711 Unilateral primary osteoarthritis, right knee: Secondary | ICD-10-CM | POA: Diagnosis not present

## 2016-04-12 DIAGNOSIS — R21 Rash and other nonspecific skin eruption: Secondary | ICD-10-CM | POA: Diagnosis not present

## 2016-04-12 DIAGNOSIS — M25561 Pain in right knee: Secondary | ICD-10-CM | POA: Diagnosis not present

## 2016-04-12 NOTE — Progress Notes (Signed)
Patient ID: YAZEN ROSKO, male   DOB: [birth year], 83 y.o.   MRN: [id number]  Chief Complaint  Patient presents with  . Knee Pain    RIGHT KNEE PAIN    HPI Latrel Szymczak Cordon is a 83 y.o. male.  Status post left total knee doing well, 2 weeks increasing pain right knee. History of right knee arthroscopy. Intermittent pain mild to moderate facet 2 weeks duration seems to be intermittent no trauma  Review of Systems Review of Systems  Constitutional: Negative for activity change, fatigue and fever.  Skin: Positive for color change and rash.     Past Medical History:  Diagnosis Date  . Arthritis   . Dyslipidemia   . Hypertension   . IBS (irritable bowel syndrome)    With loose stool  . Macular degeneration disease     Past Surgical History:  Procedure Laterality Date  . back surgeries  x   x 3  . ELBOW SURGERY    . KNEE CARTILAGE SURGERY    . SHOULDER SURGERY     rotator cuff repair (rt)  . TOE SURGERY    . TONSILLECTOMY    . TOTAL KNEE ARTHROPLASTY Left 09/03/2015   Procedure: TOTAL KNEE ARTHROPLASTY;  Surgeon: Vickki Hearing, MD;  Location: [facility];  Service: Orthopedics;  Laterality: Left;  . YAG LASER APPLICATION Right 05/29/2012   Procedure: YAG LASER APPLICATION;  Surgeon: Susa Simmonds, MD;  Location: [facility];  Service: Ophthalmology;  Laterality: Right;    Social History Social History  Substance Use Topics  . Smoking status: Never Smoker  . Smokeless tobacco: Never Used  . Alcohol use No    Allergies  Allergen Reactions  . Sulfa Antibiotics Nausea And Vomiting    Current Meds  Medication Sig  . amLODipine (NORVASC) 5 MG tablet Take 5 mg by mouth daily.  Marland Kitchen aspirin EC 325 MG EC tablet Take 1 tablet (325 mg total) by mouth daily with breakfast.  . Calcium Carb-Cholecalciferol (CALCIUM 600 + D) 600-200 MG-UNIT TABS Take 1 tablet by mouth 2 (two) times daily. Reported on 06/09/2015  . fish oil-omega-3 fatty acids 1000 MG capsule Take 1 g by mouth 2 (two)  times daily. Reported on 06/09/2015  . LORazepam (ATIVAN) 0.5 MG tablet Take 0.25 mg by mouth every 8 (eight) hours.  Marland Kitchen losartan (COZAAR) 50 MG tablet Take 50 mg by mouth at bedtime.   . Misc Natural Products (BLACK CHERRY CONCENTRATE PO) Take 1 capsule by mouth daily.  . Multiple Vitamins-Minerals (MULTIVITAMIN WITH MINERALS) tablet Take 1 tablet by mouth at bedtime.  . Multiple Vitamins-Minerals (PRESERVISION AREDS 2 PO) Take 250-200-40-1 mg-unit-mg-mg by mouth twice a day  . polycarbophil (FIBERCON) 625 MG tablet Take 1,250 mg by mouth 2 (two) times daily. Reported on 06/09/2015  . Probiotic Product (DIGESTIVE ADVANTAGE) CAPS Take 1 capsule by mouth daily.  . psyllium (METAMUCIL) 58.6 % powder Take 1 packet by mouth 2 (two) times daily. Reported on 06/09/2015  . simvastatin (ZOCOR) 40 MG tablet Take 20 mg by mouth at bedtime. Reported on 06/09/2015      Physical Exam Physical Exam BP (!) 151/84   Pulse 65   Ht 5\' 10"  (1.778 m)   Wt 188 lb (85.3 kg)   BMI 26.98 kg/m   Gen. appearance. The patient is well-developed and well-nourished, grooming and hygiene are normal. There are no gross congenital abnormalities  The patient is alert and oriented to person place and time  Mood and  affect are normal  Ambulation Remains normal  Examination reveals the following: On inspection we find tenderness over the medial compartment of the right knee  With the range of motion of  120  Stability tests were normal  in sagittal and coronal plane  Strength tests revealed grade 5 motor strength  Skin bilateral lower extremity rash unknown cause [facility] Sensation remains intact  Impression vascular system shows no peripheral edema in either ankle  Data Reviewed X-ray right knee osteoarthritis medial compartment severe lateral compartment moderate  Assessment    Encounter Diagnoses  Name Primary?  . Acute pain of right knee Yes  . Primary osteoarthritis  of right knee   . Rash and nonspecific skin eruption        Plan    Inject right knee  Referral to dermatologist follow-up for annual knee replacement x-rays on the left knee       Fuller CanadaStanley Deandrew Hoecker 04/12/2016, 9:30 AM

## 2016-04-12 NOTE — Patient Instructions (Signed)

## 2016-04-12 NOTE — Addendum Note (Signed)
Addended by: Adella HareBOOTHE, JAIME B on: 04/12/2016 10:01 AM   Modules accepted: Orders

## 2016-04-29 DIAGNOSIS — L308 Other specified dermatitis: Secondary | ICD-10-CM | POA: Diagnosis not present

## 2016-05-04 DIAGNOSIS — H353221 Exudative age-related macular degeneration, left eye, with active choroidal neovascularization: Secondary | ICD-10-CM | POA: Diagnosis not present

## 2016-05-31 DIAGNOSIS — S61310A Laceration without foreign body of right index finger with damage to nail, initial encounter: Secondary | ICD-10-CM | POA: Diagnosis not present

## 2016-06-01 DIAGNOSIS — S61210A Laceration without foreign body of right index finger without damage to nail, initial encounter: Secondary | ICD-10-CM | POA: Diagnosis not present

## 2016-06-02 DIAGNOSIS — S66821A Laceration of other specified muscles, fascia and tendons at wrist and hand level, right hand, initial encounter: Secondary | ICD-10-CM | POA: Diagnosis not present

## 2016-06-02 DIAGNOSIS — S61210A Laceration without foreign body of right index finger without damage to nail, initial encounter: Secondary | ICD-10-CM | POA: Diagnosis not present

## 2016-06-08 DIAGNOSIS — S61210D Laceration without foreign body of right index finger without damage to nail, subsequent encounter: Secondary | ICD-10-CM | POA: Diagnosis not present

## 2016-06-15 DIAGNOSIS — S61210D Laceration without foreign body of right index finger without damage to nail, subsequent encounter: Secondary | ICD-10-CM | POA: Diagnosis not present

## 2016-06-21 DIAGNOSIS — S61210D Laceration without foreign body of right index finger without damage to nail, subsequent encounter: Secondary | ICD-10-CM | POA: Diagnosis not present

## 2016-06-21 DIAGNOSIS — G3184 Mild cognitive impairment, so stated: Secondary | ICD-10-CM | POA: Diagnosis not present

## 2016-07-23 DIAGNOSIS — G3184 Mild cognitive impairment, so stated: Secondary | ICD-10-CM | POA: Diagnosis not present

## 2016-07-26 DIAGNOSIS — H348122 Central retinal vein occlusion, left eye, stable: Secondary | ICD-10-CM | POA: Diagnosis not present

## 2016-07-26 DIAGNOSIS — H353221 Exudative age-related macular degeneration, left eye, with active choroidal neovascularization: Secondary | ICD-10-CM | POA: Diagnosis not present

## 2016-07-26 DIAGNOSIS — H43811 Vitreous degeneration, right eye: Secondary | ICD-10-CM | POA: Diagnosis not present

## 2016-07-26 DIAGNOSIS — H353134 Nonexudative age-related macular degeneration, bilateral, advanced atrophic with subfoveal involvement: Secondary | ICD-10-CM | POA: Diagnosis not present

## 2016-07-26 DIAGNOSIS — H353212 Exudative age-related macular degeneration, right eye, with inactive choroidal neovascularization: Secondary | ICD-10-CM | POA: Diagnosis not present

## 2016-08-24 DIAGNOSIS — Z79899 Other long term (current) drug therapy: Secondary | ICD-10-CM | POA: Diagnosis not present

## 2016-08-24 DIAGNOSIS — G3184 Mild cognitive impairment, so stated: Secondary | ICD-10-CM | POA: Diagnosis not present

## 2016-08-27 ENCOUNTER — Encounter: Payer: Self-pay | Admitting: Orthopedic Surgery

## 2016-08-27 ENCOUNTER — Ambulatory Visit: Payer: Medicare Other | Admitting: Orthopedic Surgery

## 2016-08-27 ENCOUNTER — Ambulatory Visit (INDEPENDENT_AMBULATORY_CARE_PROVIDER_SITE_OTHER): Payer: Medicare Other | Admitting: Orthopedic Surgery

## 2016-08-27 ENCOUNTER — Ambulatory Visit (INDEPENDENT_AMBULATORY_CARE_PROVIDER_SITE_OTHER): Payer: Medicare Other

## 2016-08-27 DIAGNOSIS — Z96652 Presence of left artificial knee joint: Secondary | ICD-10-CM

## 2016-08-27 DIAGNOSIS — M1710 Unilateral primary osteoarthritis, unspecified knee: Secondary | ICD-10-CM | POA: Diagnosis not present

## 2016-08-27 NOTE — Progress Notes (Signed)
ANNUAL FOLLOW UP FOR  left TKA   Chief Complaint  Patient presents with  . Follow-up    ANNUAL XRAY LT TKA, DOS 09/03/15     HPI: The patient is here for the annual  follow-up x-ray for knee replacement   Review of Systems  Constitutional: Negative for fever.  Skin: Positive for rash.      Examination of the LEFT KNEE   Inspection shows : incision healed nicely without erythema, no tenderness no swelling  Range of motion total range of motion is 120  Stability the knee is stable anterior to posterior as well as medial to lateral  Strength quadriceps strength is normal  Skin no erythema around the skin incision  Cardiovascular mild  EDEMA    Medical decision-making section  X-rays ordered with the following personal interpretation  Normal alignment without loosening   Diagnosis  Encounter Diagnosis  Name Primary?  Marland Kitchen. Arthritis of knee Yes     Plan follow-up 1 year repeat x-rays

## 2016-10-25 DIAGNOSIS — H348122 Central retinal vein occlusion, left eye, stable: Secondary | ICD-10-CM | POA: Diagnosis not present

## 2016-10-25 DIAGNOSIS — H353212 Exudative age-related macular degeneration, right eye, with inactive choroidal neovascularization: Secondary | ICD-10-CM | POA: Diagnosis not present

## 2016-10-25 DIAGNOSIS — H353134 Nonexudative age-related macular degeneration, bilateral, advanced atrophic with subfoveal involvement: Secondary | ICD-10-CM | POA: Diagnosis not present

## 2016-10-25 DIAGNOSIS — H353231 Exudative age-related macular degeneration, bilateral, with active choroidal neovascularization: Secondary | ICD-10-CM | POA: Diagnosis not present

## 2016-10-25 DIAGNOSIS — H353222 Exudative age-related macular degeneration, left eye, with inactive choroidal neovascularization: Secondary | ICD-10-CM | POA: Diagnosis not present

## 2016-12-08 DIAGNOSIS — I10 Essential (primary) hypertension: Secondary | ICD-10-CM | POA: Diagnosis not present

## 2016-12-10 DIAGNOSIS — L309 Dermatitis, unspecified: Secondary | ICD-10-CM | POA: Diagnosis not present

## 2016-12-10 DIAGNOSIS — Z6824 Body mass index (BMI) 24.0-24.9, adult: Secondary | ICD-10-CM | POA: Diagnosis not present

## 2016-12-10 DIAGNOSIS — I10 Essential (primary) hypertension: Secondary | ICD-10-CM | POA: Diagnosis not present

## 2016-12-10 DIAGNOSIS — E782 Mixed hyperlipidemia: Secondary | ICD-10-CM | POA: Diagnosis not present

## 2016-12-10 DIAGNOSIS — Z0000 Encounter for general adult medical examination without abnormal findings: Secondary | ICD-10-CM | POA: Diagnosis not present

## 2017-01-03 DIAGNOSIS — H348122 Central retinal vein occlusion, left eye, stable: Secondary | ICD-10-CM | POA: Diagnosis not present

## 2017-01-03 DIAGNOSIS — H353212 Exudative age-related macular degeneration, right eye, with inactive choroidal neovascularization: Secondary | ICD-10-CM | POA: Diagnosis not present

## 2017-01-03 DIAGNOSIS — H353232 Exudative age-related macular degeneration, bilateral, with inactive choroidal neovascularization: Secondary | ICD-10-CM | POA: Diagnosis not present

## 2017-01-03 DIAGNOSIS — H353134 Nonexudative age-related macular degeneration, bilateral, advanced atrophic with subfoveal involvement: Secondary | ICD-10-CM | POA: Diagnosis not present

## 2017-01-03 DIAGNOSIS — H353222 Exudative age-related macular degeneration, left eye, with inactive choroidal neovascularization: Secondary | ICD-10-CM | POA: Diagnosis not present

## 2017-02-07 DIAGNOSIS — Z23 Encounter for immunization: Secondary | ICD-10-CM | POA: Diagnosis not present

## 2017-02-10 DIAGNOSIS — G3184 Mild cognitive impairment, so stated: Secondary | ICD-10-CM | POA: Diagnosis not present

## 2017-02-10 DIAGNOSIS — L309 Dermatitis, unspecified: Secondary | ICD-10-CM | POA: Diagnosis not present

## 2017-02-10 DIAGNOSIS — Z6825 Body mass index (BMI) 25.0-25.9, adult: Secondary | ICD-10-CM | POA: Diagnosis not present

## 2017-02-16 DIAGNOSIS — I872 Venous insufficiency (chronic) (peripheral): Secondary | ICD-10-CM | POA: Diagnosis not present

## 2017-03-10 DIAGNOSIS — G3184 Mild cognitive impairment, so stated: Secondary | ICD-10-CM | POA: Diagnosis not present

## 2017-03-10 DIAGNOSIS — Z710 Person encountering health services to consult on behalf of another person: Secondary | ICD-10-CM | POA: Diagnosis not present

## 2017-03-10 DIAGNOSIS — L309 Dermatitis, unspecified: Secondary | ICD-10-CM | POA: Diagnosis not present

## 2017-03-10 DIAGNOSIS — F0390 Unspecified dementia without behavioral disturbance: Secondary | ICD-10-CM | POA: Diagnosis not present

## 2017-03-24 DIAGNOSIS — F418 Other specified anxiety disorders: Secondary | ICD-10-CM | POA: Diagnosis not present

## 2017-03-24 DIAGNOSIS — F0390 Unspecified dementia without behavioral disturbance: Secondary | ICD-10-CM | POA: Diagnosis not present

## 2017-03-24 DIAGNOSIS — Z6824 Body mass index (BMI) 24.0-24.9, adult: Secondary | ICD-10-CM | POA: Diagnosis not present

## 2017-03-24 DIAGNOSIS — Z029 Encounter for administrative examinations, unspecified: Secondary | ICD-10-CM | POA: Diagnosis not present

## 2017-03-27 DIAGNOSIS — G3184 Mild cognitive impairment, so stated: Secondary | ICD-10-CM | POA: Diagnosis not present

## 2017-03-27 DIAGNOSIS — Z9181 History of falling: Secondary | ICD-10-CM | POA: Diagnosis not present

## 2017-03-27 DIAGNOSIS — Z6825 Body mass index (BMI) 25.0-25.9, adult: Secondary | ICD-10-CM | POA: Diagnosis not present

## 2017-03-27 DIAGNOSIS — F0151 Vascular dementia with behavioral disturbance: Secondary | ICD-10-CM | POA: Diagnosis not present

## 2017-03-27 DIAGNOSIS — I10 Essential (primary) hypertension: Secondary | ICD-10-CM | POA: Diagnosis not present

## 2017-03-27 DIAGNOSIS — L309 Dermatitis, unspecified: Secondary | ICD-10-CM | POA: Diagnosis not present

## 2017-03-27 DIAGNOSIS — Z7982 Long term (current) use of aspirin: Secondary | ICD-10-CM | POA: Diagnosis not present

## 2017-03-27 DIAGNOSIS — S81801D Unspecified open wound, right lower leg, subsequent encounter: Secondary | ICD-10-CM | POA: Diagnosis not present

## 2017-03-27 DIAGNOSIS — Z7189 Other specified counseling: Secondary | ICD-10-CM | POA: Diagnosis not present

## 2017-03-27 DIAGNOSIS — F0390 Unspecified dementia without behavioral disturbance: Secondary | ICD-10-CM | POA: Diagnosis not present

## 2017-03-27 DIAGNOSIS — Z710 Person encountering health services to consult on behalf of another person: Secondary | ICD-10-CM | POA: Diagnosis not present

## 2017-03-31 DIAGNOSIS — Z9181 History of falling: Secondary | ICD-10-CM | POA: Diagnosis not present

## 2017-03-31 DIAGNOSIS — I10 Essential (primary) hypertension: Secondary | ICD-10-CM | POA: Diagnosis not present

## 2017-03-31 DIAGNOSIS — F0390 Unspecified dementia without behavioral disturbance: Secondary | ICD-10-CM | POA: Diagnosis not present

## 2017-03-31 DIAGNOSIS — L309 Dermatitis, unspecified: Secondary | ICD-10-CM | POA: Diagnosis not present

## 2017-03-31 DIAGNOSIS — S81801D Unspecified open wound, right lower leg, subsequent encounter: Secondary | ICD-10-CM | POA: Diagnosis not present

## 2017-03-31 DIAGNOSIS — Z7982 Long term (current) use of aspirin: Secondary | ICD-10-CM | POA: Diagnosis not present

## 2017-04-04 DIAGNOSIS — F0390 Unspecified dementia without behavioral disturbance: Secondary | ICD-10-CM | POA: Diagnosis not present

## 2017-04-04 DIAGNOSIS — Z9181 History of falling: Secondary | ICD-10-CM | POA: Diagnosis not present

## 2017-04-04 DIAGNOSIS — Z7982 Long term (current) use of aspirin: Secondary | ICD-10-CM | POA: Diagnosis not present

## 2017-04-04 DIAGNOSIS — I10 Essential (primary) hypertension: Secondary | ICD-10-CM | POA: Diagnosis not present

## 2017-04-04 DIAGNOSIS — L309 Dermatitis, unspecified: Secondary | ICD-10-CM | POA: Diagnosis not present

## 2017-04-04 DIAGNOSIS — S81801D Unspecified open wound, right lower leg, subsequent encounter: Secondary | ICD-10-CM | POA: Diagnosis not present

## 2017-04-07 DIAGNOSIS — F0390 Unspecified dementia without behavioral disturbance: Secondary | ICD-10-CM | POA: Diagnosis not present

## 2017-04-07 DIAGNOSIS — S81801D Unspecified open wound, right lower leg, subsequent encounter: Secondary | ICD-10-CM | POA: Diagnosis not present

## 2017-04-07 DIAGNOSIS — Z7982 Long term (current) use of aspirin: Secondary | ICD-10-CM | POA: Diagnosis not present

## 2017-04-07 DIAGNOSIS — Z9181 History of falling: Secondary | ICD-10-CM | POA: Diagnosis not present

## 2017-04-07 DIAGNOSIS — L309 Dermatitis, unspecified: Secondary | ICD-10-CM | POA: Diagnosis not present

## 2017-04-07 DIAGNOSIS — I10 Essential (primary) hypertension: Secondary | ICD-10-CM | POA: Diagnosis not present

## 2017-04-11 DIAGNOSIS — S81801D Unspecified open wound, right lower leg, subsequent encounter: Secondary | ICD-10-CM | POA: Diagnosis not present

## 2017-04-11 DIAGNOSIS — L309 Dermatitis, unspecified: Secondary | ICD-10-CM | POA: Diagnosis not present

## 2017-04-11 DIAGNOSIS — I10 Essential (primary) hypertension: Secondary | ICD-10-CM | POA: Diagnosis not present

## 2017-04-11 DIAGNOSIS — F0390 Unspecified dementia without behavioral disturbance: Secondary | ICD-10-CM | POA: Diagnosis not present

## 2017-04-11 DIAGNOSIS — Z9181 History of falling: Secondary | ICD-10-CM | POA: Diagnosis not present

## 2017-04-11 DIAGNOSIS — Z7982 Long term (current) use of aspirin: Secondary | ICD-10-CM | POA: Diagnosis not present

## 2017-04-14 DIAGNOSIS — Z7982 Long term (current) use of aspirin: Secondary | ICD-10-CM | POA: Diagnosis not present

## 2017-04-14 DIAGNOSIS — S81801D Unspecified open wound, right lower leg, subsequent encounter: Secondary | ICD-10-CM | POA: Diagnosis not present

## 2017-04-14 DIAGNOSIS — I10 Essential (primary) hypertension: Secondary | ICD-10-CM | POA: Diagnosis not present

## 2017-04-14 DIAGNOSIS — L309 Dermatitis, unspecified: Secondary | ICD-10-CM | POA: Diagnosis not present

## 2017-04-14 DIAGNOSIS — Z9181 History of falling: Secondary | ICD-10-CM | POA: Diagnosis not present

## 2017-04-14 DIAGNOSIS — F0390 Unspecified dementia without behavioral disturbance: Secondary | ICD-10-CM | POA: Diagnosis not present

## 2017-04-18 DIAGNOSIS — I10 Essential (primary) hypertension: Secondary | ICD-10-CM | POA: Diagnosis not present

## 2017-04-18 DIAGNOSIS — S81801D Unspecified open wound, right lower leg, subsequent encounter: Secondary | ICD-10-CM | POA: Diagnosis not present

## 2017-04-18 DIAGNOSIS — F0390 Unspecified dementia without behavioral disturbance: Secondary | ICD-10-CM | POA: Diagnosis not present

## 2017-04-18 DIAGNOSIS — Z9181 History of falling: Secondary | ICD-10-CM | POA: Diagnosis not present

## 2017-04-18 DIAGNOSIS — L309 Dermatitis, unspecified: Secondary | ICD-10-CM | POA: Diagnosis not present

## 2017-04-18 DIAGNOSIS — Z7982 Long term (current) use of aspirin: Secondary | ICD-10-CM | POA: Diagnosis not present

## 2017-04-21 DIAGNOSIS — S81801D Unspecified open wound, right lower leg, subsequent encounter: Secondary | ICD-10-CM | POA: Diagnosis not present

## 2017-04-21 DIAGNOSIS — L309 Dermatitis, unspecified: Secondary | ICD-10-CM | POA: Diagnosis not present

## 2017-04-21 DIAGNOSIS — F0390 Unspecified dementia without behavioral disturbance: Secondary | ICD-10-CM | POA: Diagnosis not present

## 2017-04-21 DIAGNOSIS — Z7982 Long term (current) use of aspirin: Secondary | ICD-10-CM | POA: Diagnosis not present

## 2017-04-21 DIAGNOSIS — Z9181 History of falling: Secondary | ICD-10-CM | POA: Diagnosis not present

## 2017-04-21 DIAGNOSIS — I10 Essential (primary) hypertension: Secondary | ICD-10-CM | POA: Diagnosis not present

## 2017-04-25 DIAGNOSIS — Z7982 Long term (current) use of aspirin: Secondary | ICD-10-CM | POA: Diagnosis not present

## 2017-04-25 DIAGNOSIS — Z9181 History of falling: Secondary | ICD-10-CM | POA: Diagnosis not present

## 2017-04-25 DIAGNOSIS — F0390 Unspecified dementia without behavioral disturbance: Secondary | ICD-10-CM | POA: Diagnosis not present

## 2017-04-25 DIAGNOSIS — L309 Dermatitis, unspecified: Secondary | ICD-10-CM | POA: Diagnosis not present

## 2017-04-25 DIAGNOSIS — I10 Essential (primary) hypertension: Secondary | ICD-10-CM | POA: Diagnosis not present

## 2017-04-25 DIAGNOSIS — S81801D Unspecified open wound, right lower leg, subsequent encounter: Secondary | ICD-10-CM | POA: Diagnosis not present

## 2017-04-28 DIAGNOSIS — I10 Essential (primary) hypertension: Secondary | ICD-10-CM | POA: Diagnosis not present

## 2017-04-28 DIAGNOSIS — L309 Dermatitis, unspecified: Secondary | ICD-10-CM | POA: Diagnosis not present

## 2017-04-28 DIAGNOSIS — Z9181 History of falling: Secondary | ICD-10-CM | POA: Diagnosis not present

## 2017-04-28 DIAGNOSIS — Z7982 Long term (current) use of aspirin: Secondary | ICD-10-CM | POA: Diagnosis not present

## 2017-04-28 DIAGNOSIS — S81801D Unspecified open wound, right lower leg, subsequent encounter: Secondary | ICD-10-CM | POA: Diagnosis not present

## 2017-04-28 DIAGNOSIS — F0390 Unspecified dementia without behavioral disturbance: Secondary | ICD-10-CM | POA: Diagnosis not present

## 2017-05-02 DIAGNOSIS — I10 Essential (primary) hypertension: Secondary | ICD-10-CM | POA: Diagnosis not present

## 2017-05-02 DIAGNOSIS — S81801D Unspecified open wound, right lower leg, subsequent encounter: Secondary | ICD-10-CM | POA: Diagnosis not present

## 2017-05-02 DIAGNOSIS — Z7982 Long term (current) use of aspirin: Secondary | ICD-10-CM | POA: Diagnosis not present

## 2017-05-02 DIAGNOSIS — Z9181 History of falling: Secondary | ICD-10-CM | POA: Diagnosis not present

## 2017-05-02 DIAGNOSIS — L309 Dermatitis, unspecified: Secondary | ICD-10-CM | POA: Diagnosis not present

## 2017-05-02 DIAGNOSIS — F0390 Unspecified dementia without behavioral disturbance: Secondary | ICD-10-CM | POA: Diagnosis not present

## 2017-05-05 DIAGNOSIS — Z7982 Long term (current) use of aspirin: Secondary | ICD-10-CM | POA: Diagnosis not present

## 2017-05-05 DIAGNOSIS — I10 Essential (primary) hypertension: Secondary | ICD-10-CM | POA: Diagnosis not present

## 2017-05-05 DIAGNOSIS — Z9181 History of falling: Secondary | ICD-10-CM | POA: Diagnosis not present

## 2017-05-05 DIAGNOSIS — S81801D Unspecified open wound, right lower leg, subsequent encounter: Secondary | ICD-10-CM | POA: Diagnosis not present

## 2017-05-05 DIAGNOSIS — L309 Dermatitis, unspecified: Secondary | ICD-10-CM | POA: Diagnosis not present

## 2017-05-05 DIAGNOSIS — F0390 Unspecified dementia without behavioral disturbance: Secondary | ICD-10-CM | POA: Diagnosis not present

## 2017-05-09 DIAGNOSIS — L309 Dermatitis, unspecified: Secondary | ICD-10-CM | POA: Diagnosis not present

## 2017-05-09 DIAGNOSIS — F0390 Unspecified dementia without behavioral disturbance: Secondary | ICD-10-CM | POA: Diagnosis not present

## 2017-05-09 DIAGNOSIS — S81801D Unspecified open wound, right lower leg, subsequent encounter: Secondary | ICD-10-CM | POA: Diagnosis not present

## 2017-05-09 DIAGNOSIS — Z7982 Long term (current) use of aspirin: Secondary | ICD-10-CM | POA: Diagnosis not present

## 2017-05-09 DIAGNOSIS — I10 Essential (primary) hypertension: Secondary | ICD-10-CM | POA: Diagnosis not present

## 2017-05-09 DIAGNOSIS — Z9181 History of falling: Secondary | ICD-10-CM | POA: Diagnosis not present

## 2017-05-12 DIAGNOSIS — S81801D Unspecified open wound, right lower leg, subsequent encounter: Secondary | ICD-10-CM | POA: Diagnosis not present

## 2017-05-12 DIAGNOSIS — L309 Dermatitis, unspecified: Secondary | ICD-10-CM | POA: Diagnosis not present

## 2017-05-12 DIAGNOSIS — Z7982 Long term (current) use of aspirin: Secondary | ICD-10-CM | POA: Diagnosis not present

## 2017-05-12 DIAGNOSIS — Z9181 History of falling: Secondary | ICD-10-CM | POA: Diagnosis not present

## 2017-05-12 DIAGNOSIS — F0390 Unspecified dementia without behavioral disturbance: Secondary | ICD-10-CM | POA: Diagnosis not present

## 2017-05-12 DIAGNOSIS — I10 Essential (primary) hypertension: Secondary | ICD-10-CM | POA: Diagnosis not present

## 2017-05-16 DIAGNOSIS — L309 Dermatitis, unspecified: Secondary | ICD-10-CM | POA: Diagnosis not present

## 2017-05-16 DIAGNOSIS — F0390 Unspecified dementia without behavioral disturbance: Secondary | ICD-10-CM | POA: Diagnosis not present

## 2017-05-16 DIAGNOSIS — I10 Essential (primary) hypertension: Secondary | ICD-10-CM | POA: Diagnosis not present

## 2017-05-16 DIAGNOSIS — Z9181 History of falling: Secondary | ICD-10-CM | POA: Diagnosis not present

## 2017-05-16 DIAGNOSIS — S81801D Unspecified open wound, right lower leg, subsequent encounter: Secondary | ICD-10-CM | POA: Diagnosis not present

## 2017-05-16 DIAGNOSIS — Z7982 Long term (current) use of aspirin: Secondary | ICD-10-CM | POA: Diagnosis not present

## 2017-06-03 IMAGING — CR DG KNEE 1-2V PORT*L*
2 series · 2 of 2 positions shown · non-contrast
Comparison: 06/09/2015

CLINICAL DATA: Total knee replacement.

EXAM:
PORTABLE LEFT KNEE - 1-2 VIEW

[ap]
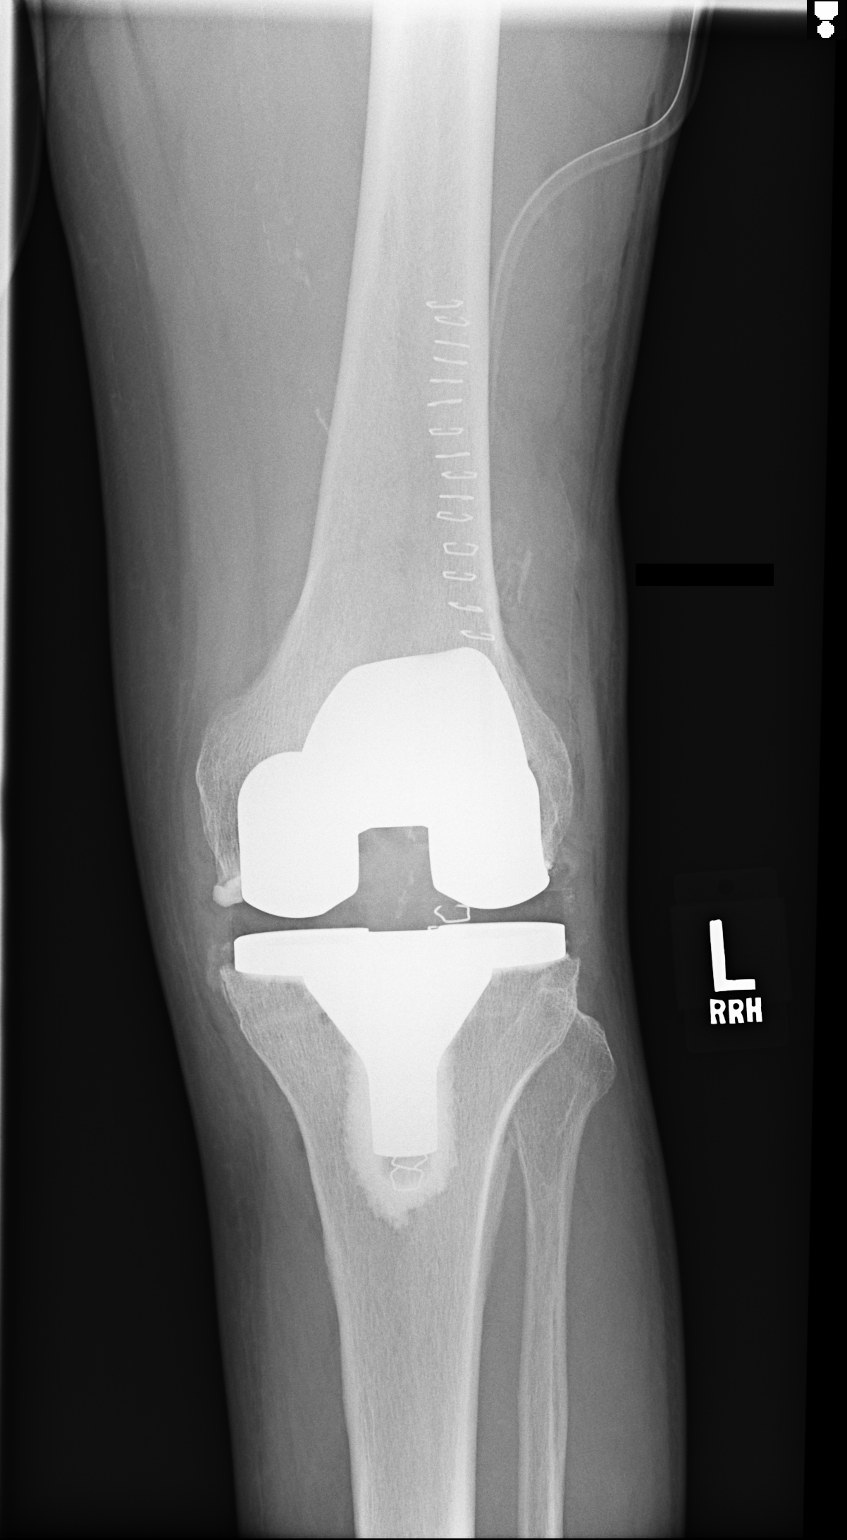

[lat]
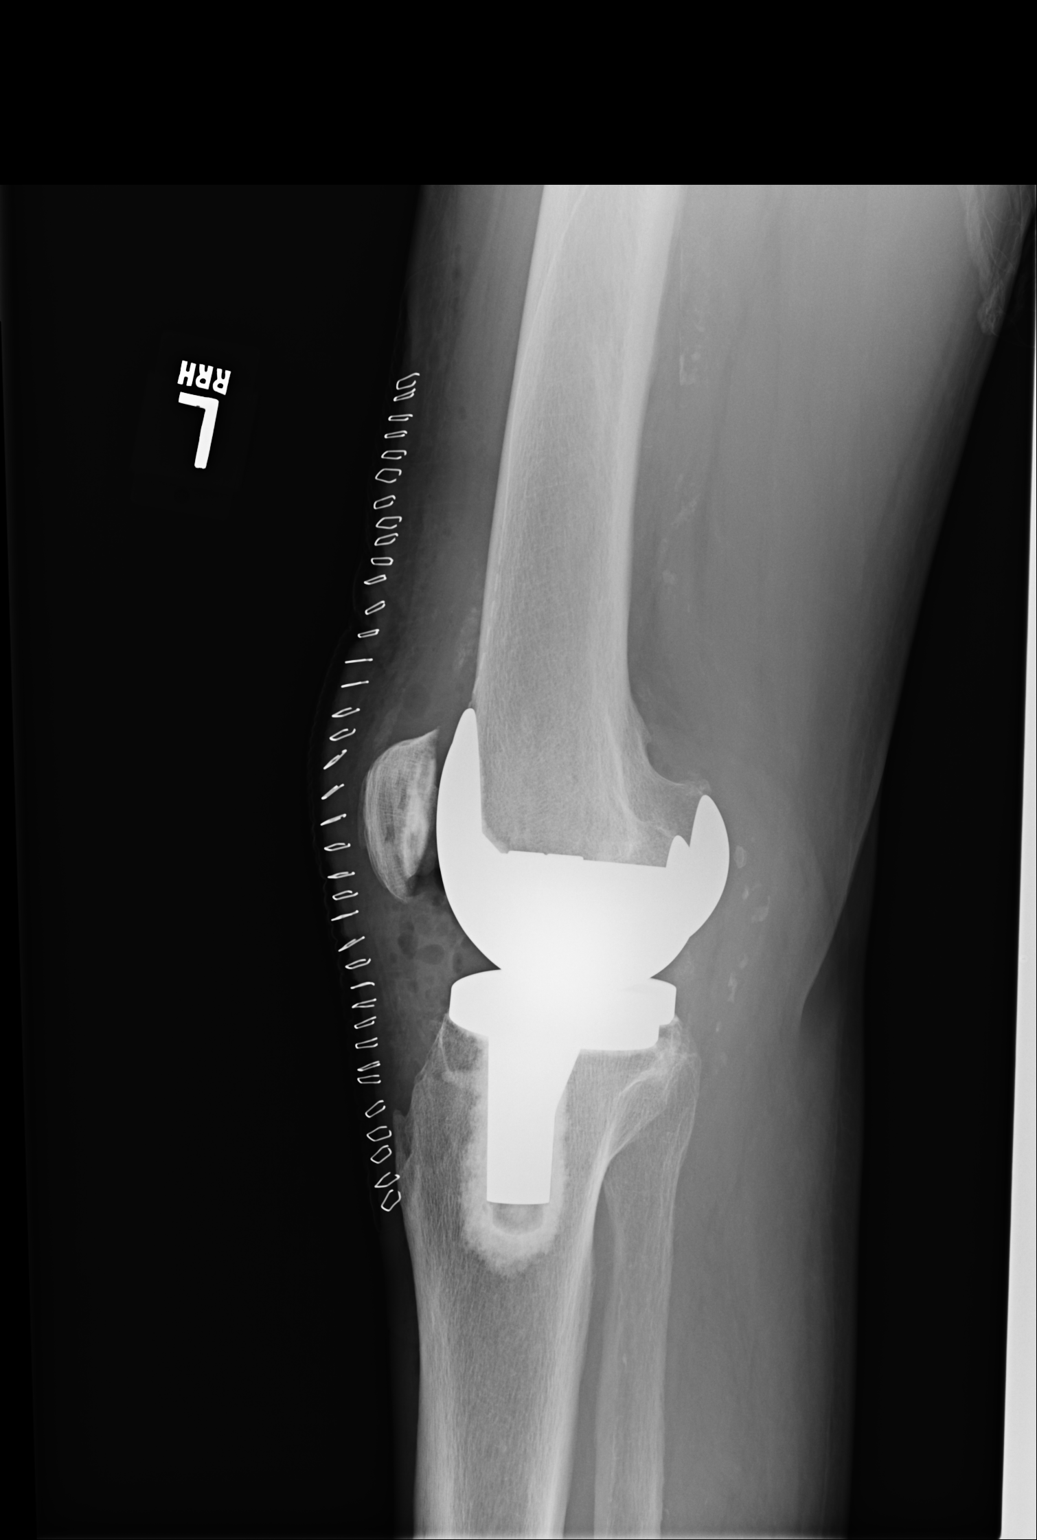

[2 of 2 positions shown; findings below may reference images not displayed]

FINDINGS: Evidence of a interval right total knee arthroplasty with prosthetic
components intact and normally located. Post surgical drain over the
superior aspect of the knee joint. Skin staples over the anterior
soft tissues. Vascular calcifications are present.
IMPRESSION: Expected findings post left total knee arthroplasty. No complicating
features.

## 2017-06-09 DIAGNOSIS — Z710 Person encountering health services to consult on behalf of another person: Secondary | ICD-10-CM | POA: Diagnosis not present

## 2017-06-09 DIAGNOSIS — Z23 Encounter for immunization: Secondary | ICD-10-CM | POA: Diagnosis not present

## 2017-06-09 DIAGNOSIS — S81801D Unspecified open wound, right lower leg, subsequent encounter: Secondary | ICD-10-CM | POA: Diagnosis not present

## 2017-06-09 DIAGNOSIS — L309 Dermatitis, unspecified: Secondary | ICD-10-CM | POA: Diagnosis not present

## 2017-06-09 DIAGNOSIS — G3184 Mild cognitive impairment, so stated: Secondary | ICD-10-CM | POA: Diagnosis not present

## 2017-06-09 DIAGNOSIS — Z7189 Other specified counseling: Secondary | ICD-10-CM | POA: Diagnosis not present

## 2017-06-09 DIAGNOSIS — F0151 Vascular dementia with behavioral disturbance: Secondary | ICD-10-CM | POA: Diagnosis not present

## 2017-06-09 DIAGNOSIS — F0390 Unspecified dementia without behavioral disturbance: Secondary | ICD-10-CM | POA: Diagnosis not present

## 2017-06-09 DIAGNOSIS — Z6825 Body mass index (BMI) 25.0-25.9, adult: Secondary | ICD-10-CM | POA: Diagnosis not present

## 2017-06-20 DIAGNOSIS — G3184 Mild cognitive impairment, so stated: Secondary | ICD-10-CM | POA: Diagnosis not present

## 2017-06-20 DIAGNOSIS — E782 Mixed hyperlipidemia: Secondary | ICD-10-CM | POA: Diagnosis not present

## 2017-06-20 DIAGNOSIS — K219 Gastro-esophageal reflux disease without esophagitis: Secondary | ICD-10-CM | POA: Diagnosis not present

## 2017-06-20 DIAGNOSIS — Z6824 Body mass index (BMI) 24.0-24.9, adult: Secondary | ICD-10-CM | POA: Diagnosis not present

## 2017-06-20 DIAGNOSIS — R634 Abnormal weight loss: Secondary | ICD-10-CM | POA: Diagnosis not present

## 2017-06-20 DIAGNOSIS — L309 Dermatitis, unspecified: Secondary | ICD-10-CM | POA: Diagnosis not present

## 2017-06-20 DIAGNOSIS — I10 Essential (primary) hypertension: Secondary | ICD-10-CM | POA: Diagnosis not present

## 2017-06-20 DIAGNOSIS — D509 Iron deficiency anemia, unspecified: Secondary | ICD-10-CM | POA: Diagnosis not present

## 2017-06-20 DIAGNOSIS — F0390 Unspecified dementia without behavioral disturbance: Secondary | ICD-10-CM | POA: Diagnosis not present

## 2017-07-11 DIAGNOSIS — H353232 Exudative age-related macular degeneration, bilateral, with inactive choroidal neovascularization: Secondary | ICD-10-CM | POA: Diagnosis not present

## 2017-07-11 DIAGNOSIS — H348122 Central retinal vein occlusion, left eye, stable: Secondary | ICD-10-CM | POA: Diagnosis not present

## 2017-07-11 DIAGNOSIS — H353134 Nonexudative age-related macular degeneration, bilateral, advanced atrophic with subfoveal involvement: Secondary | ICD-10-CM | POA: Diagnosis not present

## 2017-08-09 ENCOUNTER — Encounter: Payer: Self-pay | Admitting: Orthopedic Surgery

## 2017-08-09 ENCOUNTER — Ambulatory Visit: Payer: Medicare HMO | Admitting: Orthopedic Surgery

## 2017-08-09 ENCOUNTER — Ambulatory Visit (INDEPENDENT_AMBULATORY_CARE_PROVIDER_SITE_OTHER): Payer: Medicare HMO

## 2017-08-09 DIAGNOSIS — M5442 Lumbago with sciatica, left side: Secondary | ICD-10-CM | POA: Diagnosis not present

## 2017-08-09 DIAGNOSIS — M5441 Lumbago with sciatica, right side: Secondary | ICD-10-CM

## 2017-08-09 DIAGNOSIS — G8929 Other chronic pain: Secondary | ICD-10-CM | POA: Diagnosis not present

## 2017-08-09 DIAGNOSIS — M47816 Spondylosis without myelopathy or radiculopathy, lumbar region: Secondary | ICD-10-CM | POA: Diagnosis not present

## 2017-08-09 MED ORDER — CYCLOBENZAPRINE HCL 10 MG PO TABS: 10.0000 mg | ORAL_TABLET | Freq: Two times a day (BID) | ORAL | 1 refills | Status: DC | PRN

## 2017-08-09 MED ORDER — IBUPROFEN 800 MG PO TABS
800.0000 mg | ORAL_TABLET | Freq: Three times a day (TID) | ORAL | 1 refills | Status: DC | PRN
Start: 2017-08-09 — End: 2020-08-03

## 2017-08-09 NOTE — Progress Notes (Signed)
Progress Note   Patient ID: Christopher Warner, male   DOB: 01-09-1928, 84 y.o.   MRN: [id number] Chief Complaint  Patient presents with  . Back Pain    low back pain x 3 months no injury      Medical decision-making  Imaging:  Xrays ordered: y/n ? yes  Encounter Diagnosis  Name Primary?  . Lumbar spondylosis Yes      Meds ordered this encounter  Medications  . cyclobenzaprine (FLEXERIL) 10 MG tablet    Sig: Take 1 tablet (10 mg total) by mouth 2 (two) times daily as needed for muscle spasms.    Dispense:  40 tablet    Refill:  1  . ibuprofen (ADVIL,MOTRIN) 800 MG tablet    Sig: Take 1 tablet (800 mg total) by mouth every 8 (eight) hours as needed.    Dispense:  90 tablet    Refill:  1    PLAN:  Patient is not a surgical candidate although he has had 3 back surgeries she is now 84 years old recommend oral medications    Chief Complaint  Patient presents with  . Back Pain    low back pain x 3 months no injury     89 history of 3 back surgeries none recently complains of back pain without leg pain the pain is dull described as an ache of moderate severity constant worse with standing for long periods of time present for 4 months    Review of Systems  Gastrointestinal: Negative.   Genitourinary: Negative.   Musculoskeletal: Positive for back pain and joint pain.  Neurological: Negative for tingling, tremors, sensory change, focal weakness and weakness.   Current Meds  Medication Sig  . amLODipine (NORVASC) 5 MG tablet Take 5 mg by mouth daily.  Marland Kitchen aspirin EC 325 MG EC tablet Take 1 tablet (325 mg total) by mouth daily with breakfast.  . Calcium Carb-Cholecalciferol (CALCIUM 600 + D) 600-200 MG-UNIT TABS Take 1 tablet by mouth 2 (two) times daily. Reported on 06/09/2015  . fish oil-omega-3 fatty acids 1000 MG capsule Take 1 g by mouth 2 (two) times daily. Reported on 06/09/2015  . LORazepam (ATIVAN) 0.5 MG tablet Take 0.25 mg by mouth every 8 (eight) hours.  Marland Kitchen  losartan (COZAAR) 50 MG tablet Take 50 mg by mouth at bedtime.   . Misc Natural Products (BLACK CHERRY CONCENTRATE PO) Take 1 capsule by mouth daily.  . Multiple Vitamins-Minerals (MULTIVITAMIN WITH MINERALS) tablet Take 1 tablet by mouth at bedtime.  . Multiple Vitamins-Minerals (PRESERVISION AREDS 2 PO) Take 250-200-40-1 mg-unit-mg-mg by mouth twice a day  . polycarbophil (FIBERCON) 625 MG tablet Take 1,250 mg by mouth 2 (two) times daily. Reported on 06/09/2015  . Probiotic Product (DIGESTIVE ADVANTAGE) CAPS Take 1 capsule by mouth daily.  . psyllium (METAMUCIL) 58.6 % powder Take 1 packet by mouth 2 (two) times daily. Reported on 06/09/2015  . QUEtiapine (SEROQUEL) 50 MG tablet   . simvastatin (ZOCOR) 40 MG tablet Take 20 mg by mouth at bedtime. Reported on 06/09/2015    Past Medical History:  Diagnosis Date  . Arthritis   . Dyslipidemia   . Hypertension   . IBS (irritable bowel syndrome)    With loose stool  . Macular degeneration disease      Allergies  Allergen Reactions  . Sulfa Antibiotics Nausea And Vomiting       Physical Exam  Constitutional: He is oriented to person, place, and time. He appears well-developed and well-nourished.  Vital signs have been reviewed and are stable. Gen. appearance the patient is well-developed and well-nourished with normal grooming and hygiene.   Neurological: He is alert and oriented to person, place, and time. Gait abnormal.  Skin: Skin is warm and dry. No erythema.  Psychiatric: He has a normal mood and affect.  Vitals reviewed.   Ortho Exam  Both lower extremities have normal strength distally as to his upper extremities all joints are stable without subluxation there is no restriction in range of motion other than chronic contractures which are age-related there is no tenderness in either lower extremity has a he has very very bad skin followed by dermatologist very red areas of both lower extremities and areas where he has been  scratching sensory exam normal reflexes normal   Fuller Canada, MD  12:18 PM 08/09/2017

## 2017-08-24 DIAGNOSIS — Z6824 Body mass index (BMI) 24.0-24.9, adult: Secondary | ICD-10-CM | POA: Diagnosis not present

## 2017-08-24 DIAGNOSIS — R0782 Intercostal pain: Secondary | ICD-10-CM | POA: Diagnosis not present

## 2017-08-24 DIAGNOSIS — L299 Pruritus, unspecified: Secondary | ICD-10-CM | POA: Diagnosis not present

## 2017-08-24 DIAGNOSIS — L309 Dermatitis, unspecified: Secondary | ICD-10-CM | POA: Diagnosis not present

## 2017-08-24 DIAGNOSIS — R21 Rash and other nonspecific skin eruption: Secondary | ICD-10-CM | POA: Diagnosis not present

## 2017-08-24 DIAGNOSIS — F0390 Unspecified dementia without behavioral disturbance: Secondary | ICD-10-CM | POA: Diagnosis not present

## 2017-08-26 ENCOUNTER — Ambulatory Visit: Payer: Self-pay | Admitting: Orthopedic Surgery

## 2017-08-29 DIAGNOSIS — L0101 Non-bullous impetigo: Secondary | ICD-10-CM | POA: Diagnosis not present

## 2017-08-29 DIAGNOSIS — L2089 Other atopic dermatitis: Secondary | ICD-10-CM | POA: Diagnosis not present

## 2017-08-29 DIAGNOSIS — L0109 Other impetigo: Secondary | ICD-10-CM | POA: Diagnosis not present

## 2017-09-07 ENCOUNTER — Ambulatory Visit (INDEPENDENT_AMBULATORY_CARE_PROVIDER_SITE_OTHER): Payer: Medicare HMO

## 2017-09-07 ENCOUNTER — Ambulatory Visit: Payer: Medicare HMO | Admitting: Orthopedic Surgery

## 2017-09-07 VITALS — BP 146/86 | HR 60 | Ht 67.0 in | Wt 172.0 lb

## 2017-09-07 DIAGNOSIS — Z96652 Presence of left artificial knee joint: Secondary | ICD-10-CM

## 2017-09-07 NOTE — Progress Notes (Signed)
ANNUAL FOLLOW UP FOR  Left  TKA   Chief Complaint  Patient presents with  . Follow-up    Recheck on left knee replacement, DOS 09-03-15.     HPI: The patient is here for the annual  follow-up x-ray for knee replacement. The patient is not complaining of pain weakness instability or stiffness in the repaired knee.   Review of Systems  Skin: Positive for rash.       Skin rash has cleared     Past Medical History:  Diagnosis Date  . Arthritis   . Dyslipidemia   . Hypertension   . IBS (irritable bowel syndrome)    With loose stool  . Macular degeneration disease      Examination of the left KNEE  BP (!) 146/86   Pulse 60   Ht 5\' 7"  (1.702 m)   Wt 172 lb (78 kg)   BMI 26.94 kg/m   General the patient is normally groomed in no distress  Mood normal Affect pleasant   The patient is Awake and alert ; oriented normal   Inspection shows : incision healed nicely without erythema, no tenderness no swelling  Range of motion total range of motion is 5-120 degrees  Stability the knee is stable anterior to posterior as well as medial to lateral  Strength quadriceps strength is normal  Skin no erythema around the skin incision  Cardiovascular NO EDEMA   Neuro: normal sensation in the operative leg  Gait: Slightly abnormal but overall doing well Medical decision-making section  X-rays ordered with the following personal interpretation  Normal alignment without loosening   Diagnosis  Encounter Diagnosis  Name Primary?  . S/P total knee replacement, left 09/03/15 Yes     Plan follow-up 1 year repeat x-rays

## 2017-09-23 DIAGNOSIS — L309 Dermatitis, unspecified: Secondary | ICD-10-CM | POA: Diagnosis not present

## 2017-09-23 DIAGNOSIS — R21 Rash and other nonspecific skin eruption: Secondary | ICD-10-CM | POA: Diagnosis not present

## 2017-09-23 DIAGNOSIS — Z6824 Body mass index (BMI) 24.0-24.9, adult: Secondary | ICD-10-CM | POA: Diagnosis not present

## 2017-09-23 DIAGNOSIS — F0390 Unspecified dementia without behavioral disturbance: Secondary | ICD-10-CM | POA: Diagnosis not present

## 2017-09-30 DIAGNOSIS — L2089 Other atopic dermatitis: Secondary | ICD-10-CM | POA: Diagnosis not present

## 2017-09-30 DIAGNOSIS — L0109 Other impetigo: Secondary | ICD-10-CM | POA: Diagnosis not present

## 2017-09-30 DIAGNOSIS — L0101 Non-bullous impetigo: Secondary | ICD-10-CM | POA: Diagnosis not present

## 2017-10-28 DIAGNOSIS — Z6824 Body mass index (BMI) 24.0-24.9, adult: Secondary | ICD-10-CM | POA: Diagnosis not present

## 2017-10-28 DIAGNOSIS — Z7189 Other specified counseling: Secondary | ICD-10-CM | POA: Diagnosis not present

## 2017-10-28 DIAGNOSIS — F0151 Vascular dementia with behavioral disturbance: Secondary | ICD-10-CM | POA: Diagnosis not present

## 2017-10-31 DIAGNOSIS — R0782 Intercostal pain: Secondary | ICD-10-CM | POA: Diagnosis not present

## 2017-10-31 DIAGNOSIS — K219 Gastro-esophageal reflux disease without esophagitis: Secondary | ICD-10-CM | POA: Diagnosis not present

## 2017-10-31 DIAGNOSIS — I10 Essential (primary) hypertension: Secondary | ICD-10-CM | POA: Diagnosis not present

## 2017-10-31 DIAGNOSIS — E782 Mixed hyperlipidemia: Secondary | ICD-10-CM | POA: Diagnosis not present

## 2017-12-10 DIAGNOSIS — G3184 Mild cognitive impairment, so stated: Secondary | ICD-10-CM | POA: Diagnosis not present

## 2017-12-10 DIAGNOSIS — Z7189 Other specified counseling: Secondary | ICD-10-CM | POA: Diagnosis not present

## 2017-12-10 DIAGNOSIS — Z6825 Body mass index (BMI) 25.0-25.9, adult: Secondary | ICD-10-CM | POA: Diagnosis not present

## 2017-12-10 DIAGNOSIS — R0782 Intercostal pain: Secondary | ICD-10-CM | POA: Diagnosis not present

## 2017-12-10 DIAGNOSIS — Z710 Person encountering health services to consult on behalf of another person: Secondary | ICD-10-CM | POA: Diagnosis not present

## 2017-12-10 DIAGNOSIS — R21 Rash and other nonspecific skin eruption: Secondary | ICD-10-CM | POA: Diagnosis not present

## 2017-12-10 DIAGNOSIS — L309 Dermatitis, unspecified: Secondary | ICD-10-CM | POA: Diagnosis not present

## 2017-12-10 DIAGNOSIS — L299 Pruritus, unspecified: Secondary | ICD-10-CM | POA: Diagnosis not present

## 2017-12-10 DIAGNOSIS — F0151 Vascular dementia with behavioral disturbance: Secondary | ICD-10-CM | POA: Diagnosis not present

## 2017-12-21 DIAGNOSIS — Z6825 Body mass index (BMI) 25.0-25.9, adult: Secondary | ICD-10-CM | POA: Diagnosis not present

## 2017-12-21 DIAGNOSIS — D509 Iron deficiency anemia, unspecified: Secondary | ICD-10-CM | POA: Diagnosis not present

## 2017-12-21 DIAGNOSIS — I10 Essential (primary) hypertension: Secondary | ICD-10-CM | POA: Diagnosis not present

## 2017-12-21 DIAGNOSIS — Z712 Person consulting for explanation of examination or test findings: Secondary | ICD-10-CM | POA: Diagnosis not present

## 2017-12-21 DIAGNOSIS — E782 Mixed hyperlipidemia: Secondary | ICD-10-CM | POA: Diagnosis not present

## 2017-12-21 DIAGNOSIS — L309 Dermatitis, unspecified: Secondary | ICD-10-CM | POA: Diagnosis not present

## 2017-12-21 DIAGNOSIS — K219 Gastro-esophageal reflux disease without esophagitis: Secondary | ICD-10-CM | POA: Diagnosis not present

## 2017-12-21 DIAGNOSIS — F0390 Unspecified dementia without behavioral disturbance: Secondary | ICD-10-CM | POA: Diagnosis not present

## 2017-12-21 DIAGNOSIS — R634 Abnormal weight loss: Secondary | ICD-10-CM | POA: Diagnosis not present

## 2018-02-06 DIAGNOSIS — H348122 Central retinal vein occlusion, left eye, stable: Secondary | ICD-10-CM | POA: Diagnosis not present

## 2018-02-06 DIAGNOSIS — H353232 Exudative age-related macular degeneration, bilateral, with inactive choroidal neovascularization: Secondary | ICD-10-CM | POA: Diagnosis not present

## 2018-02-06 DIAGNOSIS — H353134 Nonexudative age-related macular degeneration, bilateral, advanced atrophic with subfoveal involvement: Secondary | ICD-10-CM | POA: Diagnosis not present

## 2018-04-08 DIAGNOSIS — R21 Rash and other nonspecific skin eruption: Secondary | ICD-10-CM | POA: Diagnosis not present

## 2018-04-08 DIAGNOSIS — L309 Dermatitis, unspecified: Secondary | ICD-10-CM | POA: Diagnosis not present

## 2018-04-08 DIAGNOSIS — Z7189 Other specified counseling: Secondary | ICD-10-CM | POA: Diagnosis not present

## 2018-04-08 DIAGNOSIS — Z710 Person encountering health services to consult on behalf of another person: Secondary | ICD-10-CM | POA: Diagnosis not present

## 2018-04-08 DIAGNOSIS — R0782 Intercostal pain: Secondary | ICD-10-CM | POA: Diagnosis not present

## 2018-04-08 DIAGNOSIS — Z712 Person consulting for explanation of examination or test findings: Secondary | ICD-10-CM | POA: Diagnosis not present

## 2018-04-08 DIAGNOSIS — L299 Pruritus, unspecified: Secondary | ICD-10-CM | POA: Diagnosis not present

## 2018-04-08 DIAGNOSIS — G3184 Mild cognitive impairment, so stated: Secondary | ICD-10-CM | POA: Diagnosis not present

## 2018-04-08 DIAGNOSIS — Z6825 Body mass index (BMI) 25.0-25.9, adult: Secondary | ICD-10-CM | POA: Diagnosis not present

## 2018-04-12 DIAGNOSIS — L309 Dermatitis, unspecified: Secondary | ICD-10-CM | POA: Diagnosis not present

## 2018-04-12 DIAGNOSIS — I10 Essential (primary) hypertension: Secondary | ICD-10-CM | POA: Diagnosis not present

## 2018-04-12 DIAGNOSIS — K219 Gastro-esophageal reflux disease without esophagitis: Secondary | ICD-10-CM | POA: Diagnosis not present

## 2018-04-12 DIAGNOSIS — F0151 Vascular dementia with behavioral disturbance: Secondary | ICD-10-CM | POA: Diagnosis not present

## 2018-04-12 DIAGNOSIS — R634 Abnormal weight loss: Secondary | ICD-10-CM | POA: Diagnosis not present

## 2018-04-12 DIAGNOSIS — E782 Mixed hyperlipidemia: Secondary | ICD-10-CM | POA: Diagnosis not present

## 2018-04-12 DIAGNOSIS — M25512 Pain in left shoulder: Secondary | ICD-10-CM | POA: Diagnosis not present

## 2018-04-12 DIAGNOSIS — Z23 Encounter for immunization: Secondary | ICD-10-CM | POA: Diagnosis not present

## 2018-04-12 DIAGNOSIS — D649 Anemia, unspecified: Secondary | ICD-10-CM | POA: Diagnosis not present

## 2018-07-20 DIAGNOSIS — Z0001 Encounter for general adult medical examination with abnormal findings: Secondary | ICD-10-CM | POA: Diagnosis not present

## 2018-08-23 DIAGNOSIS — F22 Delusional disorders: Secondary | ICD-10-CM | POA: Diagnosis not present

## 2018-08-23 DIAGNOSIS — R634 Abnormal weight loss: Secondary | ICD-10-CM | POA: Diagnosis not present

## 2018-08-23 DIAGNOSIS — Z9181 History of falling: Secondary | ICD-10-CM | POA: Diagnosis not present

## 2018-08-23 DIAGNOSIS — L309 Dermatitis, unspecified: Secondary | ICD-10-CM | POA: Diagnosis not present

## 2018-08-23 DIAGNOSIS — F0151 Vascular dementia with behavioral disturbance: Secondary | ICD-10-CM | POA: Diagnosis not present

## 2018-09-21 DIAGNOSIS — M25559 Pain in unspecified hip: Secondary | ICD-10-CM | POA: Diagnosis not present

## 2018-09-22 ENCOUNTER — Ambulatory Visit: Payer: Self-pay | Admitting: Orthopedic Surgery

## 2018-10-04 ENCOUNTER — Ambulatory Visit (INDEPENDENT_AMBULATORY_CARE_PROVIDER_SITE_OTHER): Payer: Medicare HMO

## 2018-10-04 ENCOUNTER — Other Ambulatory Visit: Payer: Self-pay

## 2018-10-04 ENCOUNTER — Ambulatory Visit (INDEPENDENT_AMBULATORY_CARE_PROVIDER_SITE_OTHER): Payer: Medicare HMO | Admitting: Orthopedic Surgery

## 2018-10-04 VITALS — BP 132/78 | HR 75 | Temp 98.1°F | Wt 185.6 lb

## 2018-10-04 DIAGNOSIS — Z96652 Presence of left artificial knee joint: Secondary | ICD-10-CM | POA: Diagnosis not present

## 2018-10-04 DIAGNOSIS — I872 Venous insufficiency (chronic) (peripheral): Secondary | ICD-10-CM

## 2018-10-04 DIAGNOSIS — M25561 Pain in right knee: Secondary | ICD-10-CM

## 2018-10-04 DIAGNOSIS — R609 Edema, unspecified: Secondary | ICD-10-CM | POA: Diagnosis not present

## 2018-10-04 DIAGNOSIS — R600 Localized edema: Secondary | ICD-10-CM

## 2018-10-04 NOTE — Progress Notes (Signed)
Chief Complaint  Patient presents with  . Knee Pain    left knee DOS 09/03/2015     85 year old male had a left total knee roughly 3 years ago is developed dementia he fell 2 weeks ago complains of pain in his right knee leg and calf.  He had a virtual visit with primary care but no films were done.  He complains of pain and swelling in his left calf and lower leg does not actually complain of left knee pain and describes both areas of mild pain.  He is able to ambulate seems to be getting better but is persistent.  His left total knee is functioning well   Review of Systems  Constitutional: Negative for chills, fever and weight loss.  Psychiatric/Behavioral: Positive for memory loss.     Past Medical History:  Diagnosis Date  . Arthritis   . Dyslipidemia   . Hypertension   . IBS (irritable bowel syndrome)    With loose stool  . Macular degeneration disease     BP 132/78   Pulse 75   Temp 98.1 F (36.7 C)   Wt 185 lb 9.6 oz (84.2 kg)   BMI 29.07 kg/m   Physical Exam Vitals signs and nursing note reviewed.  Constitutional:      Appearance: Normal appearance.  Neurological:     Mental Status: He is alert and oriented to person, place, and time.     Gait: Gait abnormal.     Comments: Abnormal gait requires walker patient walks with a crouched knee gait and lumbopelvic flexion  Psychiatric:        Mood and Affect: Mood normal.    Left total knee incision looks good no swelling flexion arc is 120 degrees knee is stable extension power normal distally has skin discoloration but that is chronic from venous stasis mild pitting edema lower Calf and tibia normal sensation left leg  Right lower extremity no tenderness in the knee flexion extension arc is relatively normal knee is stable extension power was normal skin is intact with no bruising distally he has a 1 inch larger calf on the right and some calf and lower leg tenderness and normal sensation in the right leg  MED  DECX. MAKING  IMAGING:   Left knee total knee looks stable there is some cement on the medial edge which is stable and has not moved her calcifications in the back of the calf from popliteal vessel  Right knee See report   Encounter Diagnoses  Name Primary?  . S/P total knee replacement, left 09/03/15   . Acute pain of right knee   . Edema of both lower legs due to peripheral venous insufficiency Yes    RISK:  Recommend TED hose, leg elevation.  Leg swelling increases call primary care doctor or to order ultrasound for DVT surveillance

## 2018-10-04 NOTE — Patient Instructions (Addendum)
Elevate the legs when you are sitting  If the swelling gets worse or you get swelling of the knee and thigh call your doctor for evaluation  Edema  Edema is an abnormal buildup of fluids in the body tissues and under the skin. Swelling of the legs, feet, and ankles is a common symptom that becomes more likely as you get older. Swelling is also common in looser tissues, like around the eyes. When the affected area is squeezed, the fluid may move out of that spot and leave a dent for a few moments. This dent is called pitting edema. There are many possible causes of edema. Eating too much salt (sodium) and being on your feet or sitting for a long time can cause edema in your legs, feet, and ankles. Hot weather may make edema worse. Common causes of edema include:  Heart failure.  Liver or kidney disease.  Weak leg blood vessels.  Cancer.  An injury.  Pregnancy.  Medicines.  Being obese.  Low protein levels in the blood. Edema is usually painless. Your skin may look swollen or shiny. Follow these instructions at home:  Keep the affected body part raised (elevated) above the level of your heart when you are sitting or lying down.  Do not sit still or stand for long periods of time.  Do not wear tight clothing. Do not wear garters on your upper legs.  Exercise your legs to get your circulation going. This helps to move the fluid back into your blood vessels, and it may help the swelling go down.  Wear elastic bandages or support stockings to reduce swelling as told by your health care provider.  Eat a low-salt (low-sodium) diet to reduce fluid as told by your health care provider.  Depending on the cause of your swelling, you may need to limit how much fluid you drink (fluid restriction).  Take over-the-counter and prescription medicines only as told by your health care provider. Contact a health care provider if:  Your edema does not get better with treatment.  You have  heart, liver, or kidney disease and have symptoms of edema.  You have sudden and unexplained weight gain. Get help right away if:  You develop shortness of breath or chest pain.  You cannot breathe when you lie down.  You develop pain, redness, or warmth in the swollen areas.  You have heart, liver, or kidney disease and suddenly get edema.  You have a fever and your symptoms suddenly get worse. Summary  Edema is an abnormal buildup of fluids in the body tissues and under the skin.  Eating too much salt (sodium) and being on your feet or sitting for a long time can cause edema in your legs, feet, and ankles.  Keep the affected body part raised (elevated) above the level of your heart when you are sitting or lying down. This information is not intended to replace advice given to you by your health care provider. Make sure you discuss any questions you have with your health care provider. Document Released: 03/15/2005 Document Revised: 03/18/2017 Document Reviewed: 04/17/2016 Elsevier Patient Education  [place].   For pain Tylenol was okay and any of the following muscle creams are okay  These are the muscle and arthrits creams I recommend:  PLEASE READ THE PACKAGE INSTRUCTIONS BEFORE USING   Ben Gay arthritis cream  Icy hot vanishing gel  Aspercreme odor free  Myoflex Oderless pain reliever  Capzasin  Sportscreme  Max freeze

## 2018-10-17 ENCOUNTER — Emergency Department (HOSPITAL_COMMUNITY)
Admission: EM | Admit: 2018-10-17 | Discharge: 2018-10-17 | Discharge status: Against Medical Advice | Payer: Medicare HMO | Attending: Emergency Medicine | Admitting: Emergency Medicine

## 2018-10-17 ENCOUNTER — Encounter (HOSPITAL_COMMUNITY): Payer: Self-pay | Admitting: *Deleted

## 2018-10-17 ENCOUNTER — Other Ambulatory Visit: Payer: Self-pay

## 2018-10-17 DIAGNOSIS — R5381 Other malaise: Secondary | ICD-10-CM | POA: Diagnosis not present

## 2018-10-17 DIAGNOSIS — R55 Syncope and collapse: Secondary | ICD-10-CM | POA: Diagnosis not present

## 2018-10-17 DIAGNOSIS — R638 Other symptoms and signs concerning food and fluid intake: Secondary | ICD-10-CM | POA: Diagnosis not present

## 2018-10-17 DIAGNOSIS — R1111 Vomiting without nausea: Secondary | ICD-10-CM | POA: Diagnosis not present

## 2018-10-17 DIAGNOSIS — R197 Diarrhea, unspecified: Secondary | ICD-10-CM | POA: Diagnosis not present

## 2018-10-17 DIAGNOSIS — R1110 Vomiting, unspecified: Secondary | ICD-10-CM | POA: Diagnosis present

## 2018-10-17 DIAGNOSIS — Z5321 Procedure and treatment not carried out due to patient leaving prior to being seen by health care provider: Secondary | ICD-10-CM | POA: Insufficient documentation

## 2018-10-17 DIAGNOSIS — R112 Nausea with vomiting, unspecified: Secondary | ICD-10-CM | POA: Diagnosis not present

## 2018-10-17 DIAGNOSIS — Z209 Contact with and (suspected) exposure to unspecified communicable disease: Secondary | ICD-10-CM | POA: Diagnosis not present

## 2018-10-17 LAB — BASIC METABOLIC PANEL
Anion gap: 11 (ref 5–15)
BUN: 22 mg/dL (ref 8–23)
CO2: 23 mmol/L (ref 22–32)
Calcium: 9.3 mg/dL (ref 8.9–10.3)
Chloride: 107 mmol/L (ref 98–111)
Creatinine, Ser: 1.23 mg/dL (ref 0.61–1.24)
GFR calc Af Amer: >60 mL/min (ref >60)
GFR calc non Af Amer: 52 mL/min — ABNORMAL LOW (ref >60)
Glucose, Bld: 125 mg/dL — ABNORMAL HIGH (ref 70–99)
Potassium: 4.1 mmol/L (ref 3.5–5.1)
Sodium: 141 mmol/L (ref 135–145)

## 2018-10-17 LAB — CBC WITH DIFFERENTIAL/PLATELET
Abs Immature Granulocytes: 0.08 10*3/uL — ABNORMAL HIGH (ref 0.00–0.07)
Basophils Absolute: 0 10*3/uL (ref 0.0–0.1)
Basophils Relative: 0 %
Eosinophils Absolute: 0 10*3/uL (ref 0.0–0.5)
Eosinophils Relative: 0 %
HCT: 45.1 % (ref 39.0–52.0)
Hemoglobin: 14.7 g/dL (ref 13.0–17.0)
Immature Granulocytes: 1 %
Lymphocytes Relative: 7 %
Lymphs Abs: 0.8 10*3/uL (ref 0.7–4.0)
MCH: 32.7 pg (ref 26.0–34.0)
MCHC: 32.6 g/dL (ref 30.0–36.0)
MCV: 100.4 fL — ABNORMAL HIGH (ref 80.0–100.0)
Monocytes Absolute: 0.6 10*3/uL (ref 0.1–1.0)
Monocytes Relative: 5 %
Neutro Abs: 10.2 10*3/uL — ABNORMAL HIGH (ref 1.7–7.7)
Neutrophils Relative %: 87 %
Platelets: 278 10*3/uL (ref 150–400)
RBC: 4.49 MIL/uL (ref 4.22–5.81)
RDW: 13.2 % (ref 11.5–15.5)
WBC: 11.8 10*3/uL — ABNORMAL HIGH (ref 4.0–10.5)
nRBC: 0 % (ref 0.0–0.2)

## 2018-10-17 NOTE — ED Triage Notes (Signed)
Reported pt was sitting outside and vomited 2-3 times today.  Pt denies nausea in triage. Denies pain.

## 2018-10-18 DIAGNOSIS — R55 Syncope and collapse: Secondary | ICD-10-CM | POA: Diagnosis not present

## 2018-10-18 DIAGNOSIS — L309 Dermatitis, unspecified: Secondary | ICD-10-CM | POA: Diagnosis not present

## 2018-10-18 DIAGNOSIS — R197 Diarrhea, unspecified: Secondary | ICD-10-CM | POA: Diagnosis not present

## 2018-10-18 DIAGNOSIS — R410 Disorientation, unspecified: Secondary | ICD-10-CM | POA: Diagnosis not present

## 2018-10-18 DIAGNOSIS — R358 Other polyuria: Secondary | ICD-10-CM | POA: Diagnosis not present

## 2018-10-18 DIAGNOSIS — D649 Anemia, unspecified: Secondary | ICD-10-CM | POA: Diagnosis not present

## 2018-10-18 DIAGNOSIS — N390 Urinary tract infection, site not specified: Secondary | ICD-10-CM | POA: Diagnosis not present

## 2018-10-18 DIAGNOSIS — Z0001 Encounter for general adult medical examination with abnormal findings: Secondary | ICD-10-CM | POA: Diagnosis not present

## 2018-10-18 DIAGNOSIS — M25512 Pain in left shoulder: Secondary | ICD-10-CM | POA: Diagnosis not present

## 2018-10-18 DIAGNOSIS — N3941 Urge incontinence: Secondary | ICD-10-CM | POA: Diagnosis not present

## 2018-10-18 DIAGNOSIS — R112 Nausea with vomiting, unspecified: Secondary | ICD-10-CM | POA: Diagnosis not present

## 2018-10-18 DIAGNOSIS — R0782 Intercostal pain: Secondary | ICD-10-CM | POA: Diagnosis not present

## 2018-10-18 DIAGNOSIS — R21 Rash and other nonspecific skin eruption: Secondary | ICD-10-CM | POA: Diagnosis not present

## 2018-10-18 DIAGNOSIS — Z7189 Other specified counseling: Secondary | ICD-10-CM | POA: Diagnosis not present

## 2018-10-18 DIAGNOSIS — R638 Other symptoms and signs concerning food and fluid intake: Secondary | ICD-10-CM | POA: Diagnosis not present

## 2018-10-18 DIAGNOSIS — R634 Abnormal weight loss: Secondary | ICD-10-CM | POA: Diagnosis not present

## 2018-11-16 DIAGNOSIS — R197 Diarrhea, unspecified: Secondary | ICD-10-CM | POA: Diagnosis not present

## 2018-11-16 DIAGNOSIS — F0151 Vascular dementia with behavioral disturbance: Secondary | ICD-10-CM | POA: Diagnosis not present

## 2018-11-16 DIAGNOSIS — E782 Mixed hyperlipidemia: Secondary | ICD-10-CM | POA: Diagnosis not present

## 2018-11-16 DIAGNOSIS — K219 Gastro-esophageal reflux disease without esophagitis: Secondary | ICD-10-CM | POA: Diagnosis not present

## 2018-11-16 DIAGNOSIS — I10 Essential (primary) hypertension: Secondary | ICD-10-CM | POA: Diagnosis not present

## 2018-11-16 DIAGNOSIS — L309 Dermatitis, unspecified: Secondary | ICD-10-CM | POA: Diagnosis not present

## 2018-11-16 DIAGNOSIS — R634 Abnormal weight loss: Secondary | ICD-10-CM | POA: Diagnosis not present

## 2018-11-16 DIAGNOSIS — R944 Abnormal results of kidney function studies: Secondary | ICD-10-CM | POA: Diagnosis not present

## 2018-11-16 DIAGNOSIS — D649 Anemia, unspecified: Secondary | ICD-10-CM | POA: Diagnosis not present

## 2018-11-21 DIAGNOSIS — H353232 Exudative age-related macular degeneration, bilateral, with inactive choroidal neovascularization: Secondary | ICD-10-CM | POA: Diagnosis not present

## 2018-11-21 DIAGNOSIS — H353222 Exudative age-related macular degeneration, left eye, with inactive choroidal neovascularization: Secondary | ICD-10-CM | POA: Diagnosis not present

## 2018-11-21 DIAGNOSIS — H353134 Nonexudative age-related macular degeneration, bilateral, advanced atrophic with subfoveal involvement: Secondary | ICD-10-CM | POA: Diagnosis not present

## 2018-11-21 DIAGNOSIS — H348122 Central retinal vein occlusion, left eye, stable: Secondary | ICD-10-CM | POA: Diagnosis not present

## 2018-11-23 DIAGNOSIS — Z0001 Encounter for general adult medical examination with abnormal findings: Secondary | ICD-10-CM | POA: Diagnosis not present

## 2018-11-23 DIAGNOSIS — R944 Abnormal results of kidney function studies: Secondary | ICD-10-CM | POA: Diagnosis not present

## 2018-11-23 DIAGNOSIS — R634 Abnormal weight loss: Secondary | ICD-10-CM | POA: Diagnosis not present

## 2018-11-23 DIAGNOSIS — R0782 Intercostal pain: Secondary | ICD-10-CM | POA: Diagnosis not present

## 2018-11-23 DIAGNOSIS — R197 Diarrhea, unspecified: Secondary | ICD-10-CM | POA: Diagnosis not present

## 2018-11-23 DIAGNOSIS — L309 Dermatitis, unspecified: Secondary | ICD-10-CM | POA: Diagnosis not present

## 2018-11-23 DIAGNOSIS — R638 Other symptoms and signs concerning food and fluid intake: Secondary | ICD-10-CM | POA: Diagnosis not present

## 2018-11-23 DIAGNOSIS — D649 Anemia, unspecified: Secondary | ICD-10-CM | POA: Diagnosis not present

## 2018-11-23 DIAGNOSIS — F0151 Vascular dementia with behavioral disturbance: Secondary | ICD-10-CM | POA: Diagnosis not present

## 2018-11-23 DIAGNOSIS — M25512 Pain in left shoulder: Secondary | ICD-10-CM | POA: Diagnosis not present

## 2018-11-23 DIAGNOSIS — R55 Syncope and collapse: Secondary | ICD-10-CM | POA: Diagnosis not present

## 2018-11-23 DIAGNOSIS — Z7189 Other specified counseling: Secondary | ICD-10-CM | POA: Diagnosis not present

## 2018-11-23 DIAGNOSIS — Z0000 Encounter for general adult medical examination without abnormal findings: Secondary | ICD-10-CM | POA: Diagnosis not present

## 2018-12-26 DIAGNOSIS — L309 Dermatitis, unspecified: Secondary | ICD-10-CM | POA: Diagnosis not present

## 2018-12-26 DIAGNOSIS — I10 Essential (primary) hypertension: Secondary | ICD-10-CM | POA: Diagnosis not present

## 2018-12-26 DIAGNOSIS — D649 Anemia, unspecified: Secondary | ICD-10-CM | POA: Diagnosis not present

## 2018-12-26 DIAGNOSIS — R944 Abnormal results of kidney function studies: Secondary | ICD-10-CM | POA: Diagnosis not present

## 2018-12-26 DIAGNOSIS — R634 Abnormal weight loss: Secondary | ICD-10-CM | POA: Diagnosis not present

## 2018-12-26 DIAGNOSIS — E782 Mixed hyperlipidemia: Secondary | ICD-10-CM | POA: Diagnosis not present

## 2018-12-26 DIAGNOSIS — R197 Diarrhea, unspecified: Secondary | ICD-10-CM | POA: Diagnosis not present

## 2018-12-26 DIAGNOSIS — F0151 Vascular dementia with behavioral disturbance: Secondary | ICD-10-CM | POA: Diagnosis not present

## 2018-12-26 DIAGNOSIS — K219 Gastro-esophageal reflux disease without esophagitis: Secondary | ICD-10-CM | POA: Diagnosis not present

## 2019-01-15 DIAGNOSIS — D649 Anemia, unspecified: Secondary | ICD-10-CM | POA: Diagnosis not present

## 2019-01-15 DIAGNOSIS — R944 Abnormal results of kidney function studies: Secondary | ICD-10-CM | POA: Diagnosis not present

## 2019-01-15 DIAGNOSIS — R634 Abnormal weight loss: Secondary | ICD-10-CM | POA: Diagnosis not present

## 2019-01-15 DIAGNOSIS — K219 Gastro-esophageal reflux disease without esophagitis: Secondary | ICD-10-CM | POA: Diagnosis not present

## 2019-01-15 DIAGNOSIS — I10 Essential (primary) hypertension: Secondary | ICD-10-CM | POA: Diagnosis not present

## 2019-01-15 DIAGNOSIS — L309 Dermatitis, unspecified: Secondary | ICD-10-CM | POA: Diagnosis not present

## 2019-01-15 DIAGNOSIS — F0151 Vascular dementia with behavioral disturbance: Secondary | ICD-10-CM | POA: Diagnosis not present

## 2019-01-15 DIAGNOSIS — R197 Diarrhea, unspecified: Secondary | ICD-10-CM | POA: Diagnosis not present

## 2019-01-15 DIAGNOSIS — E782 Mixed hyperlipidemia: Secondary | ICD-10-CM | POA: Diagnosis not present

## 2019-01-17 DIAGNOSIS — H5200 Hypermetropia, unspecified eye: Secondary | ICD-10-CM | POA: Diagnosis not present

## 2019-02-20 DIAGNOSIS — Z23 Encounter for immunization: Secondary | ICD-10-CM | POA: Diagnosis not present

## 2019-04-11 DIAGNOSIS — I10 Essential (primary) hypertension: Secondary | ICD-10-CM | POA: Diagnosis not present

## 2019-04-11 DIAGNOSIS — E7849 Other hyperlipidemia: Secondary | ICD-10-CM | POA: Diagnosis not present

## 2019-04-11 DIAGNOSIS — F0151 Vascular dementia with behavioral disturbance: Secondary | ICD-10-CM | POA: Diagnosis not present

## 2019-04-11 DIAGNOSIS — Z23 Encounter for immunization: Secondary | ICD-10-CM | POA: Diagnosis not present

## 2019-05-30 DIAGNOSIS — E7849 Other hyperlipidemia: Secondary | ICD-10-CM | POA: Diagnosis not present

## 2019-05-30 DIAGNOSIS — I10 Essential (primary) hypertension: Secondary | ICD-10-CM | POA: Diagnosis not present

## 2019-05-30 DIAGNOSIS — F0151 Vascular dementia with behavioral disturbance: Secondary | ICD-10-CM | POA: Diagnosis not present

## 2019-05-30 DIAGNOSIS — Z23 Encounter for immunization: Secondary | ICD-10-CM | POA: Diagnosis not present

## 2019-08-02 DIAGNOSIS — Z0001 Encounter for general adult medical examination with abnormal findings: Secondary | ICD-10-CM | POA: Diagnosis not present

## 2019-08-02 DIAGNOSIS — E7849 Other hyperlipidemia: Secondary | ICD-10-CM | POA: Diagnosis not present

## 2019-08-02 DIAGNOSIS — R7301 Impaired fasting glucose: Secondary | ICD-10-CM | POA: Diagnosis not present

## 2019-08-02 DIAGNOSIS — D509 Iron deficiency anemia, unspecified: Secondary | ICD-10-CM | POA: Diagnosis not present

## 2019-08-02 DIAGNOSIS — F0151 Vascular dementia with behavioral disturbance: Secondary | ICD-10-CM | POA: Diagnosis not present

## 2019-08-02 DIAGNOSIS — Z0000 Encounter for general adult medical examination without abnormal findings: Secondary | ICD-10-CM | POA: Diagnosis not present

## 2019-08-02 DIAGNOSIS — I10 Essential (primary) hypertension: Secondary | ICD-10-CM | POA: Diagnosis not present

## 2019-08-02 DIAGNOSIS — E782 Mixed hyperlipidemia: Secondary | ICD-10-CM | POA: Diagnosis not present

## 2019-08-02 DIAGNOSIS — G3184 Mild cognitive impairment, so stated: Secondary | ICD-10-CM | POA: Diagnosis not present

## 2019-08-02 DIAGNOSIS — D649 Anemia, unspecified: Secondary | ICD-10-CM | POA: Diagnosis not present

## 2019-08-07 DIAGNOSIS — R197 Diarrhea, unspecified: Secondary | ICD-10-CM | POA: Diagnosis not present

## 2019-08-07 DIAGNOSIS — R634 Abnormal weight loss: Secondary | ICD-10-CM | POA: Diagnosis not present

## 2019-08-07 DIAGNOSIS — L309 Dermatitis, unspecified: Secondary | ICD-10-CM | POA: Diagnosis not present

## 2019-08-07 DIAGNOSIS — D649 Anemia, unspecified: Secondary | ICD-10-CM | POA: Diagnosis not present

## 2019-08-07 DIAGNOSIS — F0391 Unspecified dementia with behavioral disturbance: Secondary | ICD-10-CM | POA: Diagnosis not present

## 2019-08-07 DIAGNOSIS — K219 Gastro-esophageal reflux disease without esophagitis: Secondary | ICD-10-CM | POA: Diagnosis not present

## 2019-08-07 DIAGNOSIS — I10 Essential (primary) hypertension: Secondary | ICD-10-CM | POA: Diagnosis not present

## 2019-08-07 DIAGNOSIS — F0151 Vascular dementia with behavioral disturbance: Secondary | ICD-10-CM | POA: Diagnosis not present

## 2019-08-07 DIAGNOSIS — E782 Mixed hyperlipidemia: Secondary | ICD-10-CM | POA: Diagnosis not present

## 2019-08-07 DIAGNOSIS — R944 Abnormal results of kidney function studies: Secondary | ICD-10-CM | POA: Diagnosis not present

## 2019-08-17 DIAGNOSIS — R197 Diarrhea, unspecified: Secondary | ICD-10-CM | POA: Diagnosis not present

## 2019-08-17 DIAGNOSIS — R634 Abnormal weight loss: Secondary | ICD-10-CM | POA: Diagnosis not present

## 2019-08-17 DIAGNOSIS — L309 Dermatitis, unspecified: Secondary | ICD-10-CM | POA: Diagnosis not present

## 2019-08-17 DIAGNOSIS — E782 Mixed hyperlipidemia: Secondary | ICD-10-CM | POA: Diagnosis not present

## 2019-08-17 DIAGNOSIS — K219 Gastro-esophageal reflux disease without esophagitis: Secondary | ICD-10-CM | POA: Diagnosis not present

## 2019-08-17 DIAGNOSIS — D649 Anemia, unspecified: Secondary | ICD-10-CM | POA: Diagnosis not present

## 2019-08-17 DIAGNOSIS — F0151 Vascular dementia with behavioral disturbance: Secondary | ICD-10-CM | POA: Diagnosis not present

## 2019-08-17 DIAGNOSIS — R944 Abnormal results of kidney function studies: Secondary | ICD-10-CM | POA: Diagnosis not present

## 2019-08-17 DIAGNOSIS — I10 Essential (primary) hypertension: Secondary | ICD-10-CM | POA: Diagnosis not present

## 2019-09-24 ENCOUNTER — Ambulatory Visit (INDEPENDENT_AMBULATORY_CARE_PROVIDER_SITE_OTHER): Payer: Medicare HMO | Admitting: Ophthalmology

## 2019-09-24 ENCOUNTER — Encounter (INDEPENDENT_AMBULATORY_CARE_PROVIDER_SITE_OTHER): Payer: Self-pay | Admitting: Ophthalmology

## 2019-09-24 ENCOUNTER — Other Ambulatory Visit: Payer: Self-pay

## 2019-09-24 DIAGNOSIS — H353222 Exudative age-related macular degeneration, left eye, with inactive choroidal neovascularization: Secondary | ICD-10-CM

## 2019-09-24 DIAGNOSIS — H353231 Exudative age-related macular degeneration, bilateral, with active choroidal neovascularization: Secondary | ICD-10-CM | POA: Diagnosis not present

## 2019-09-24 DIAGNOSIS — H353212 Exudative age-related macular degeneration, right eye, with inactive choroidal neovascularization: Secondary | ICD-10-CM | POA: Diagnosis not present

## 2019-09-24 DIAGNOSIS — H353134 Nonexudative age-related macular degeneration, bilateral, advanced atrophic with subfoveal involvement: Secondary | ICD-10-CM | POA: Diagnosis not present

## 2019-09-24 DIAGNOSIS — H3570 Unspecified separation of retinal layers: Secondary | ICD-10-CM

## 2019-09-24 NOTE — Assessment & Plan Note (Signed)
NO Active CNVM OS, over the last 9 to 12 months

## 2019-09-24 NOTE — Assessment & Plan Note (Signed)

## 2019-09-24 NOTE — Progress Notes (Signed)
09/24/2019     CHIEF COMPLAINT Patient presents for Retina Follow Up   HISTORY OF PRESENT ILLNESS: STANISLAV Warner is a 86 y.o. male who presents to the clinic today for:   HPI    Retina Follow Up    Patient presents with  Dry AMD.  In both eyes.  This started 10 months ago.  Severity is moderate.  Duration of 10 months.  Since onset it is stable.          Comments    10 Month AMD F/U OU  Pt denies noticeable changes to [place] OU since last visit. Pt denies ocular pain, flashes of light, or floaters OU.         Last edited by Ileana Roup, COA on 09/24/2019  8:07 AM. (History)      Referring physician: Benita Stabile, MD [address] [place],  [place] [address]  HISTORICAL INFORMATION:   Selected notes from the MEDICAL RECORD NUMBER       CURRENT MEDICATIONS: No current outpatient medications on file. (Ophthalmic Drugs)   No current facility-administered medications for this visit. (Ophthalmic Drugs)   Current Outpatient Medications (Other)  Medication Sig  . amLODipine (NORVASC) 5 MG tablet Take 5 mg by mouth daily.  Marland Kitchen aspirin EC 325 MG EC tablet Take 1 tablet (325 mg total) by mouth daily with breakfast. (Patient not taking: Reported on 10/04/2018)  . Calcium Carb-Cholecalciferol (CALCIUM 600 + D) 600-200 MG-UNIT TABS Take 1 tablet by mouth 2 (two) times daily. Reported on 06/09/2015  . cyclobenzaprine (FLEXERIL) 10 MG tablet Take 1 tablet (10 mg total) by mouth 2 (two) times daily as needed for muscle spasms. (Patient not taking: Reported on 10/04/2018)  . fish oil-omega-3 fatty acids 1000 MG capsule Take 1 g by mouth 2 (two) times daily. Reported on 06/09/2015  . ibuprofen (ADVIL,MOTRIN) 800 MG tablet Take 1 tablet (800 mg total) by mouth every 8 (eight) hours as needed. (Patient not taking: Reported on 10/04/2018)  . LORazepam (ATIVAN) 0.5 MG tablet Take 0.25 mg by mouth every 8 (eight) hours.  Marland Kitchen losartan (COZAAR) 50 MG tablet Take 50 mg by mouth at bedtime.   .  memantine (NAMENDA) 10 MG tablet   . Misc Natural Products (BLACK CHERRY CONCENTRATE PO) Take 1 capsule by mouth daily.  . Multiple Vitamins-Minerals (MULTIVITAMIN WITH MINERALS) tablet Take 1 tablet by mouth at bedtime.  . Multiple Vitamins-Minerals (PRESERVISION AREDS 2 PO) Take 250-200-40-1 mg-unit-mg-mg by mouth twice a day  . polycarbophil (FIBERCON) 625 MG tablet Take 1,250 mg by mouth 2 (two) times daily. Reported on 06/09/2015  . Probiotic Product (DIGESTIVE ADVANTAGE) CAPS Take 1 capsule by mouth daily.  . psyllium (METAMUCIL) 58.6 % powder Take 1 packet by mouth 2 (two) times daily. Reported on 06/09/2015  . QUEtiapine (SEROQUEL) 50 MG tablet   . simvastatin (ZOCOR) 40 MG tablet Take 20 mg by mouth at bedtime. Reported on 06/09/2015   No current facility-administered medications for this visit. (Other)      REVIEW OF SYSTEMS:    ALLERGIES Allergies  Allergen Reactions  . Sulfa Antibiotics Nausea And Vomiting    PAST MEDICAL HISTORY Past Medical History:  Diagnosis Date  . Arthritis   . Dyslipidemia   . Hypertension   . IBS (irritable bowel syndrome)    With loose stool  . Macular degeneration disease    Past Surgical History:  Procedure Laterality Date  . back surgeries  x   x  3  . ELBOW SURGERY    . KNEE CARTILAGE SURGERY    . SHOULDER SURGERY     rotator cuff repair (rt)  . TOE SURGERY    . TONSILLECTOMY    . TOTAL KNEE ARTHROPLASTY Left 09/03/2015   Procedure: TOTAL KNEE ARTHROPLASTY;  Surgeon: Carole Civil, MD;  Location: [facility];  Service: Orthopedics;  Laterality: Left;  . YAG LASER APPLICATION Right 5/0/9326   Procedure: YAG LASER APPLICATION;  Surgeon: Williams Che, MD;  Location: [facility];  Service: Ophthalmology;  Laterality: Right;    FAMILY HISTORY Family History  Problem Relation Age of Onset  . Cancer Mother        Breast cancer  . Stroke Mother   . Stroke Father   . Diabetes Neg Hx   . Hearing loss Neg Hx     SOCIAL  HISTORY Social History   Tobacco Use  . Smoking status: Never Smoker  . Smokeless tobacco: Never Used  Substance Use Topics  . Alcohol use: No  . Drug use: No         OPHTHALMIC EXAM:  Base Eye Exam    Visual Acuity (ETDRS)      Right Left   Dist cc CF @ 1' 20/80   Dist ph cc NI NI   Correction: Glasses       Tonometry (Tonopen, 8:10 AM)      Right Left   Pressure 11 11       Pupils      Pupils Dark Light Shape React APD   Right PERRL 3 2 Round Slow None   Left PERRL 3 2 Round Slow None       Visual Fields (Counting fingers)      Left Right    Full Full       Extraocular Movement      Right Left    Full Full       Neuro/Psych    Oriented x3: Yes   Mood/Affect: Normal       Dilation    Both eyes: 1.0% Mydriacyl, 2.5% Phenylephrine @ 8:10 AM        Slit Lamp and Fundus Exam    External Exam      Right Left   External Normal Normal       Slit Lamp Exam      Right Left   Lids/Lashes Normal Normal   Conjunctiva/Sclera White and quiet White and quiet   Cornea Clear Clear   Anterior Chamber Deep and quiet Deep and quiet   Iris Round and reactive Round and reactive   Lens Centered posterior chamber intraocular lens Centered posterior chamber intraocular lens   Anterior Vitreous Normal Normal       Fundus Exam      Right Left   Posterior Vitreous Posterior vitreous detachment Posterior vitreous detachment   Disc Normal Normal   C/D Ratio 0.2 0.2   Macula Geographic atrophy, Retinal pigment epithelial atrophy - geographic, in the foveal vascular zone Geographic atrophy, Retinal pigment epithelial atrophy - geographic, splits the foveal avascular zone   Vessels Normal Normal   Periphery Normal Normal          IMAGING AND PROCEDURES  Imaging and Procedures for 09/24/19  OCT, Retina - OU - Both Eyes       Right Eye Central Foveal Thickness: 260. Progression has been stable. Findings include retinal drusen , subretinal scarring, no IRF,  no SRF.   Left Eye Quality  was good. Scan locations included subfoveal. Central Foveal Thickness: 206. Progression has been stable. Findings include no SRF, no IRF, subretinal scarring, disciform scar.   Notes OD, with chronic subretinal scarring, some foveal debris, hyper reflective material.  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 The results of the AREDS 2 study was discussed with the patient. A diet rich in dark leafy green vegetables was advised and specific recommendations were made regarding supplements with AREDS 2 formulation . Control of hypertension and serum cholesterol may slow the disease. Smoking cessation is mandatory to slow the disease and diminish the risk of progressing to wet age related macular degeneration. The patient was instructed in the use of an Amsler Grid and was told to return immediately for any changes in the Grid. Stressed to the patient do not rub eyes  Exudative age-related macular degeneration of left eye with inactive choroidal neovascularization (HCC) NO Active CNVM OS, over the last 9 to 12 months      ICD-10-CM   1. Exudative age-related macular degeneration of left eye with inactive choroidal neovascularization (HCC)  H35.3222 OCT, Retina - OU - Both Eyes  2. Retinal layer separation  H35.70   3. Exudative age-related macular degeneration of right eye with inactive choroidal neovascularization (HCC)  H35.3212 OCT, Retina - OU - Both Eyes  4. 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  Patient should take legally blind precautions and life and activities of daily living.  Although  the left eye is is stable compared to last year.  2.  3.  Ophthalmic Meds Ordered this visit:  No orders of the defined types were placed in this encounter.      Return in about 1 year (around 09/23/2020) for DILATE OU, OCT, COLOR FP.  There are no Patient Instructions on file for this visit.   Explained the diagnoses, plan, and follow up with the patient and they expressed understanding.  Patient expressed understanding of the importance of proper follow up care.   Alford Highland Trishelle Devora M.D. Diseases & Surgery of the Retina and Vitreous [facility] 09/24/19     Abbreviations: M myopia (nearsighted); A astigmatism; H hyperopia (farsighted); P presbyopia; Mrx spectacle prescription;  CTL contact lenses; OD right eye; OS left eye; OU both eyes  XT exotropia; ET esotropia; PEK punctate epithelial keratitis; PEE punctate epithelial erosions; DES dry eye syndrome; MGD meibomian gland dysfunction; ATs artificial tears; PFAT's preservative free artificial tears; NSC nuclear sclerotic cataract; PSC posterior subcapsular cataract; ERM epi-retinal membrane; PVD posterior vitreous detachment; RD retinal detachment; DM diabetes mellitus; DR diabetic retinopathy; NPDR non-proliferative diabetic retinopathy; PDR proliferative diabetic retinopathy; CSME clinically significant macular edema; DME diabetic macular edema; dbh dot blot hemorrhages; CWS cotton wool spot; POAG primary open angle glaucoma; C/D cup-to-disc ratio; HVF humphrey visual field; GVF goldmann visual field; OCT optical coherence tomography; IOP intraocular pressure; BRVO Branch retinal vein occlusion; CRVO central retinal vein occlusion; CRAO central retinal artery occlusion; BRAO branch retinal artery occlusion; RT retinal tear; SB scleral buckle; PPV pars plana vitrectomy; VH Vitreous hemorrhage; PRP panretinal laser photocoagulation; IVK  intravitreal kenalog; VMT vitreomacular traction; MH Macular hole;  NVD  neovascularization of the disc; NVE neovascularization elsewhere; AREDS age related eye disease study; ARMD age related macular degeneration; POAG primary open angle glaucoma; EBMD epithelial/anterior basement membrane dystrophy; ACIOL anterior chamber intraocular lens; IOL intraocular lens; PCIOL posterior chamber intraocular lens; Phaco/IOL phacoemulsification with intraocular lens placement; PRK photorefractive keratectomy; LASIK laser assisted in situ keratomileusis; HTN hypertension; DM diabetes mellitus; COPD chronic obstructive pulmonary disease

## 2019-10-22 DIAGNOSIS — E7849 Other hyperlipidemia: Secondary | ICD-10-CM | POA: Diagnosis not present

## 2019-10-22 DIAGNOSIS — I10 Essential (primary) hypertension: Secondary | ICD-10-CM | POA: Diagnosis not present

## 2019-10-22 DIAGNOSIS — F0151 Vascular dementia with behavioral disturbance: Secondary | ICD-10-CM | POA: Diagnosis not present

## 2019-10-22 DIAGNOSIS — Z23 Encounter for immunization: Secondary | ICD-10-CM | POA: Diagnosis not present

## 2020-01-09 DIAGNOSIS — D649 Anemia, unspecified: Secondary | ICD-10-CM | POA: Diagnosis not present

## 2020-01-09 DIAGNOSIS — Z7189 Other specified counseling: Secondary | ICD-10-CM | POA: Diagnosis not present

## 2020-01-09 DIAGNOSIS — E7849 Other hyperlipidemia: Secondary | ICD-10-CM | POA: Diagnosis not present

## 2020-01-09 DIAGNOSIS — Z0000 Encounter for general adult medical examination without abnormal findings: Secondary | ICD-10-CM | POA: Diagnosis not present

## 2020-01-09 DIAGNOSIS — Z0001 Encounter for general adult medical examination with abnormal findings: Secondary | ICD-10-CM | POA: Diagnosis not present

## 2020-01-09 DIAGNOSIS — L309 Dermatitis, unspecified: Secondary | ICD-10-CM | POA: Diagnosis not present

## 2020-01-09 DIAGNOSIS — R0782 Intercostal pain: Secondary | ICD-10-CM | POA: Diagnosis not present

## 2020-01-09 DIAGNOSIS — R4182 Altered mental status, unspecified: Secondary | ICD-10-CM | POA: Diagnosis not present

## 2020-01-09 DIAGNOSIS — N3941 Urge incontinence: Secondary | ICD-10-CM | POA: Diagnosis not present

## 2020-01-09 DIAGNOSIS — R55 Syncope and collapse: Secondary | ICD-10-CM | POA: Diagnosis not present

## 2020-01-09 DIAGNOSIS — R638 Other symptoms and signs concerning food and fluid intake: Secondary | ICD-10-CM | POA: Diagnosis not present

## 2020-02-08 DIAGNOSIS — R55 Syncope and collapse: Secondary | ICD-10-CM | POA: Diagnosis not present

## 2020-02-08 DIAGNOSIS — Z0000 Encounter for general adult medical examination without abnormal findings: Secondary | ICD-10-CM | POA: Diagnosis not present

## 2020-02-08 DIAGNOSIS — E7849 Other hyperlipidemia: Secondary | ICD-10-CM | POA: Diagnosis not present

## 2020-02-08 DIAGNOSIS — Z7189 Other specified counseling: Secondary | ICD-10-CM | POA: Diagnosis not present

## 2020-02-08 DIAGNOSIS — Z0001 Encounter for general adult medical examination with abnormal findings: Secondary | ICD-10-CM | POA: Diagnosis not present

## 2020-02-08 DIAGNOSIS — R0782 Intercostal pain: Secondary | ICD-10-CM | POA: Diagnosis not present

## 2020-02-08 DIAGNOSIS — D649 Anemia, unspecified: Secondary | ICD-10-CM | POA: Diagnosis not present

## 2020-02-08 DIAGNOSIS — R638 Other symptoms and signs concerning food and fluid intake: Secondary | ICD-10-CM | POA: Diagnosis not present

## 2020-02-08 DIAGNOSIS — L309 Dermatitis, unspecified: Secondary | ICD-10-CM | POA: Diagnosis not present

## 2020-02-11 DIAGNOSIS — D649 Anemia, unspecified: Secondary | ICD-10-CM | POA: Diagnosis not present

## 2020-02-11 DIAGNOSIS — Z0000 Encounter for general adult medical examination without abnormal findings: Secondary | ICD-10-CM | POA: Diagnosis not present

## 2020-02-11 DIAGNOSIS — Z0001 Encounter for general adult medical examination with abnormal findings: Secondary | ICD-10-CM | POA: Diagnosis not present

## 2020-02-11 DIAGNOSIS — R0782 Intercostal pain: Secondary | ICD-10-CM | POA: Diagnosis not present

## 2020-02-11 DIAGNOSIS — Z7189 Other specified counseling: Secondary | ICD-10-CM | POA: Diagnosis not present

## 2020-02-11 DIAGNOSIS — R55 Syncope and collapse: Secondary | ICD-10-CM | POA: Diagnosis not present

## 2020-02-11 DIAGNOSIS — L309 Dermatitis, unspecified: Secondary | ICD-10-CM | POA: Diagnosis not present

## 2020-02-11 DIAGNOSIS — R638 Other symptoms and signs concerning food and fluid intake: Secondary | ICD-10-CM | POA: Diagnosis not present

## 2020-02-11 DIAGNOSIS — E7849 Other hyperlipidemia: Secondary | ICD-10-CM | POA: Diagnosis not present

## 2020-02-13 DIAGNOSIS — I10 Essential (primary) hypertension: Secondary | ICD-10-CM | POA: Diagnosis not present

## 2020-02-13 DIAGNOSIS — E7849 Other hyperlipidemia: Secondary | ICD-10-CM | POA: Diagnosis not present

## 2020-02-13 DIAGNOSIS — R634 Abnormal weight loss: Secondary | ICD-10-CM | POA: Diagnosis not present

## 2020-02-13 DIAGNOSIS — R197 Diarrhea, unspecified: Secondary | ICD-10-CM | POA: Diagnosis not present

## 2020-02-13 DIAGNOSIS — F0391 Unspecified dementia with behavioral disturbance: Secondary | ICD-10-CM | POA: Diagnosis not present

## 2020-02-13 DIAGNOSIS — Z23 Encounter for immunization: Secondary | ICD-10-CM | POA: Diagnosis not present

## 2020-02-13 DIAGNOSIS — E782 Mixed hyperlipidemia: Secondary | ICD-10-CM | POA: Diagnosis not present

## 2020-02-13 DIAGNOSIS — D649 Anemia, unspecified: Secondary | ICD-10-CM | POA: Diagnosis not present

## 2020-02-13 DIAGNOSIS — K219 Gastro-esophageal reflux disease without esophagitis: Secondary | ICD-10-CM | POA: Diagnosis not present

## 2020-02-13 DIAGNOSIS — F0151 Vascular dementia with behavioral disturbance: Secondary | ICD-10-CM | POA: Diagnosis not present

## 2020-02-13 DIAGNOSIS — L309 Dermatitis, unspecified: Secondary | ICD-10-CM | POA: Diagnosis not present

## 2020-03-28 DIAGNOSIS — F0151 Vascular dementia with behavioral disturbance: Secondary | ICD-10-CM | POA: Diagnosis not present

## 2020-03-28 DIAGNOSIS — E7849 Other hyperlipidemia: Secondary | ICD-10-CM | POA: Diagnosis not present

## 2020-03-28 DIAGNOSIS — Z23 Encounter for immunization: Secondary | ICD-10-CM | POA: Diagnosis not present

## 2020-03-28 DIAGNOSIS — I10 Essential (primary) hypertension: Secondary | ICD-10-CM | POA: Diagnosis not present

## 2020-04-19 DIAGNOSIS — R531 Weakness: Secondary | ICD-10-CM | POA: Diagnosis not present

## 2020-04-19 DIAGNOSIS — R5381 Other malaise: Secondary | ICD-10-CM | POA: Diagnosis not present

## 2020-05-26 DIAGNOSIS — I10 Essential (primary) hypertension: Secondary | ICD-10-CM | POA: Diagnosis not present

## 2020-05-26 DIAGNOSIS — F0151 Vascular dementia with behavioral disturbance: Secondary | ICD-10-CM | POA: Diagnosis not present

## 2020-05-26 DIAGNOSIS — E7849 Other hyperlipidemia: Secondary | ICD-10-CM | POA: Diagnosis not present

## 2020-05-26 DIAGNOSIS — Z23 Encounter for immunization: Secondary | ICD-10-CM | POA: Diagnosis not present

## 2020-06-03 DIAGNOSIS — H35363 Drusen (degenerative) of macula, bilateral: Secondary | ICD-10-CM | POA: Diagnosis not present

## 2020-07-27 DIAGNOSIS — K219 Gastro-esophageal reflux disease without esophagitis: Secondary | ICD-10-CM | POA: Diagnosis not present

## 2020-07-27 DIAGNOSIS — F0391 Unspecified dementia with behavioral disturbance: Secondary | ICD-10-CM | POA: Diagnosis not present

## 2020-07-27 DIAGNOSIS — J9601 Acute respiratory failure with hypoxia: Secondary | ICD-10-CM | POA: Diagnosis not present

## 2020-07-27 DIAGNOSIS — I10 Essential (primary) hypertension: Secondary | ICD-10-CM | POA: Diagnosis not present

## 2020-07-27 DIAGNOSIS — I2699 Other pulmonary embolism without acute cor pulmonale: Secondary | ICD-10-CM | POA: Diagnosis not present

## 2020-07-31 ENCOUNTER — Inpatient Hospital Stay (HOSPITAL_COMMUNITY)
Admission: EM | Admit: 2020-07-31 | Discharge: 2020-08-03 | DRG: 175 | Disposition: A | Discharge status: Home / Self Care | Payer: Medicare HMO | Attending: Internal Medicine | Admitting: Internal Medicine

## 2020-07-31 ENCOUNTER — Emergency Department (HOSPITAL_COMMUNITY): Payer: Medicare HMO

## 2020-07-31 ENCOUNTER — Encounter (HOSPITAL_COMMUNITY): Payer: Self-pay | Admitting: Emergency Medicine

## 2020-07-31 ENCOUNTER — Other Ambulatory Visit: Payer: Self-pay

## 2020-07-31 DIAGNOSIS — J9601 Acute respiratory failure with hypoxia: Secondary | ICD-10-CM

## 2020-07-31 DIAGNOSIS — E782 Mixed hyperlipidemia: Secondary | ICD-10-CM | POA: Diagnosis not present

## 2020-07-31 DIAGNOSIS — R0602 Shortness of breath: Secondary | ICD-10-CM

## 2020-07-31 DIAGNOSIS — I499 Cardiac arrhythmia, unspecified: Secondary | ICD-10-CM | POA: Diagnosis not present

## 2020-07-31 DIAGNOSIS — F0390 Unspecified dementia without behavioral disturbance: Secondary | ICD-10-CM

## 2020-07-31 DIAGNOSIS — R069 Unspecified abnormalities of breathing: Secondary | ICD-10-CM | POA: Diagnosis not present

## 2020-07-31 DIAGNOSIS — Z882 Allergy status to sulfonamides status: Secondary | ICD-10-CM | POA: Diagnosis not present

## 2020-07-31 DIAGNOSIS — K219 Gastro-esophageal reflux disease without esophagitis: Secondary | ICD-10-CM | POA: Diagnosis present

## 2020-07-31 DIAGNOSIS — H3530 Unspecified macular degeneration: Secondary | ICD-10-CM | POA: Diagnosis present

## 2020-07-31 DIAGNOSIS — G4700 Insomnia, unspecified: Secondary | ICD-10-CM | POA: Diagnosis not present

## 2020-07-31 DIAGNOSIS — I129 Hypertensive chronic kidney disease with stage 1 through stage 4 chronic kidney disease, or unspecified chronic kidney disease: Secondary | ICD-10-CM | POA: Diagnosis not present

## 2020-07-31 DIAGNOSIS — Z20822 Contact with and (suspected) exposure to covid-19: Secondary | ICD-10-CM | POA: Diagnosis present

## 2020-07-31 DIAGNOSIS — I2694 Multiple subsegmental pulmonary emboli without acute cor pulmonale: Secondary | ICD-10-CM | POA: Diagnosis not present

## 2020-07-31 DIAGNOSIS — Z79899 Other long term (current) drug therapy: Secondary | ICD-10-CM | POA: Diagnosis not present

## 2020-07-31 DIAGNOSIS — Z7982 Long term (current) use of aspirin: Secondary | ICD-10-CM

## 2020-07-31 DIAGNOSIS — I10 Essential (primary) hypertension: Secondary | ICD-10-CM | POA: Diagnosis not present

## 2020-07-31 DIAGNOSIS — Z96652 Presence of left artificial knee joint: Secondary | ICD-10-CM | POA: Diagnosis present

## 2020-07-31 DIAGNOSIS — Z66 Do not resuscitate: Secondary | ICD-10-CM | POA: Diagnosis not present

## 2020-07-31 DIAGNOSIS — K589 Irritable bowel syndrome without diarrhea: Secondary | ICD-10-CM | POA: Diagnosis present

## 2020-07-31 DIAGNOSIS — R7989 Other specified abnormal findings of blood chemistry: Secondary | ICD-10-CM | POA: Diagnosis not present

## 2020-07-31 DIAGNOSIS — E785 Hyperlipidemia, unspecified: Secondary | ICD-10-CM | POA: Diagnosis not present

## 2020-07-31 DIAGNOSIS — F419 Anxiety disorder, unspecified: Secondary | ICD-10-CM | POA: Diagnosis present

## 2020-07-31 DIAGNOSIS — I2699 Other pulmonary embolism without acute cor pulmonale: Principal | ICD-10-CM | POA: Diagnosis present

## 2020-07-31 DIAGNOSIS — J90 Pleural effusion, not elsewhere classified: Secondary | ICD-10-CM

## 2020-07-31 DIAGNOSIS — N1831 Chronic kidney disease, stage 3a: Secondary | ICD-10-CM | POA: Diagnosis present

## 2020-07-31 DIAGNOSIS — R059 Cough, unspecified: Secondary | ICD-10-CM | POA: Diagnosis not present

## 2020-07-31 DIAGNOSIS — D72829 Elevated white blood cell count, unspecified: Secondary | ICD-10-CM

## 2020-07-31 DIAGNOSIS — R0902 Hypoxemia: Secondary | ICD-10-CM | POA: Diagnosis not present

## 2020-07-31 DIAGNOSIS — R509 Fever, unspecified: Secondary | ICD-10-CM | POA: Diagnosis not present

## 2020-07-31 LAB — CBC WITH DIFFERENTIAL/PLATELET
Abs Immature Granulocytes: 0.03 10*3/uL (ref 0.00–0.07)
Basophils Absolute: 0.1 10*3/uL (ref 0.0–0.1)
Basophils Relative: 1 %
Eosinophils Absolute: 0.1 10*3/uL (ref 0.0–0.5)
Eosinophils Relative: 1 %
HCT: 44.2 % (ref 39.0–52.0)
Hemoglobin: 14.5 g/dL (ref 13.0–17.0)
Immature Granulocytes: 0 %
Lymphocytes Relative: 7 %
Lymphs Abs: 0.7 10*3/uL (ref 0.7–4.0)
MCH: 32.3 pg (ref 26.0–34.0)
MCHC: 32.8 g/dL (ref 30.0–36.0)
MCV: 98.4 fL (ref 80.0–100.0)
Monocytes Absolute: 1 10*3/uL (ref 0.1–1.0)
Monocytes Relative: 9 %
Neutro Abs: 8.9 10*3/uL — ABNORMAL HIGH (ref 1.7–7.7)
Neutrophils Relative %: 82 %
Platelets: 218 10*3/uL (ref 150–400)
RBC: 4.49 MIL/uL (ref 4.22–5.81)
RDW: 13.3 % (ref 11.5–15.5)
WBC: 10.8 10*3/uL — ABNORMAL HIGH (ref 4.0–10.5)
nRBC: 0 % (ref 0.0–0.2)

## 2020-07-31 LAB — COMPREHENSIVE METABOLIC PANEL
ALT: 12 U/L (ref 0–44)
AST: 16 U/L (ref 15–41)
Albumin: 3.6 g/dL (ref 3.5–5.0)
Alkaline Phosphatase: 66 U/L (ref 38–126)
Anion gap: 9 (ref 5–15)
BUN: 25 mg/dL — ABNORMAL HIGH (ref 8–23)
CO2: 22 mmol/L (ref 22–32)
Calcium: 8.5 mg/dL — ABNORMAL LOW (ref 8.9–10.3)
Chloride: 106 mmol/L (ref 98–111)
Creatinine, Ser: 1.26 mg/dL — ABNORMAL HIGH (ref 0.61–1.24)
GFR, Estimated: 54 mL/min — ABNORMAL LOW (ref >60)
Glucose, Bld: 120 mg/dL — ABNORMAL HIGH (ref 70–99)
Potassium: 4 mmol/L (ref 3.5–5.1)
Sodium: 137 mmol/L (ref 135–145)
Total Bilirubin: 0.7 mg/dL (ref 0.3–1.2)
Total Protein: 6.8 g/dL (ref 6.5–8.1)

## 2020-07-31 LAB — TROPONIN I (HIGH SENSITIVITY)
Troponin I (High Sensitivity): 54 ng/L — ABNORMAL HIGH (ref <18)
Troponin I (High Sensitivity): 55 ng/L — ABNORMAL HIGH (ref <18)

## 2020-07-31 LAB — PROCALCITONIN: Procalcitonin: <0.1 ng/mL

## 2020-07-31 LAB — TRIGLYCERIDES: Triglycerides: 81 mg/dL (ref <150)

## 2020-07-31 LAB — RESP PANEL BY RT-PCR (FLU A&B, COVID) ARPGX2
Influenza A by PCR: NEGATIVE
Influenza B by PCR: NEGATIVE
SARS Coronavirus 2 by RT PCR: NEGATIVE

## 2020-07-31 LAB — C-REACTIVE PROTEIN: CRP: 9 mg/dL — ABNORMAL HIGH (ref <1.0)

## 2020-07-31 LAB — LACTIC ACID, PLASMA
Lactic Acid, Venous: 1.8 mmol/L (ref 0.5–1.9)
Lactic Acid, Venous: 1.5 mmol/L (ref 0.5–1.9)

## 2020-07-31 LAB — BRAIN NATRIURETIC PEPTIDE: B Natriuretic Peptide: 128 pg/mL — ABNORMAL HIGH (ref 0.0–100.0)

## 2020-07-31 LAB — LACTATE DEHYDROGENASE: LDH: 162 U/L (ref 98–192)

## 2020-07-31 LAB — FERRITIN: Ferritin: 74 ng/mL (ref 24–336)

## 2020-07-31 LAB — PHOSPHORUS: Phosphorus: 3 mg/dL (ref 2.5–4.6)

## 2020-07-31 LAB — FIBRINOGEN: Fibrinogen: 530 mg/dL — ABNORMAL HIGH (ref 210–475)

## 2020-07-31 LAB — D-DIMER, QUANTITATIVE: D-Dimer, Quant: 5.82 ug/mL-FEU — ABNORMAL HIGH (ref 0.00–0.50)

## 2020-07-31 MED ORDER — LOSARTAN POTASSIUM 50 MG PO TABS: 100.0000 mg | ORAL_TABLET | Freq: Every day | ORAL | Status: DC

## 2020-07-31 MED ORDER — HEPARIN BOLUS VIA INFUSION
5000.0000 [IU] | Freq: Once | INTRAVENOUS | Status: AC
  Administered 2020-07-31: 5000 [IU] via INTRAVENOUS

## 2020-07-31 MED ORDER — AMLODIPINE BESYLATE 5 MG PO TABS: 10.0000 mg | ORAL_TABLET | Freq: Every day | ORAL | Status: DC

## 2020-07-31 MED ORDER — IOHEXOL 350 MG/ML SOLN
75.0000 mL | Freq: Once | INTRAVENOUS | Status: AC | PRN
  Administered 2020-07-31: 75 mL via INTRAVENOUS

## 2020-07-31 MED ORDER — HEPARIN (PORCINE) 25000 UT/250ML-% IV SOLN
1250.0000 [IU]/h | INTRAVENOUS | Status: DC
  Administered 2020-07-31 – 2020-08-01 (×2): 1350 [IU]/h via INTRAVENOUS
  Administered 2020-08-02 – 2020-08-03 (×2): 1250 [IU]/h via INTRAVENOUS
  Filled 2020-07-31 (×4): qty 250

## 2020-07-31 MED ORDER — MEMANTINE HCL 10 MG PO TABS
10.0000 mg | ORAL_TABLET | Freq: Two times a day (BID) | ORAL | Status: DC
  Administered 2020-07-31 – 2020-08-03 (×6): 10 mg via ORAL
  Filled 2020-07-31 (×6): qty 1

## 2020-07-31 MED ORDER — SIMVASTATIN 20 MG PO TABS
20.0000 mg | ORAL_TABLET | Freq: Every day | ORAL | Status: DC
  Administered 2020-07-31 – 2020-08-02 (×3): 20 mg via ORAL
  Filled 2020-07-31 (×3): qty 1

## 2020-07-31 MED ORDER — QUETIAPINE FUMARATE 25 MG PO TABS
50.0000 mg | ORAL_TABLET | Freq: Two times a day (BID) | ORAL | Status: DC
  Administered 2020-07-31 – 2020-08-03 (×6): 50 mg via ORAL
  Filled 2020-07-31 (×6): qty 2

## 2020-07-31 NOTE — H&P (Signed)
History and Physical  Christopher Warner DOB: [birth year] DOA: 07/31/2020  Referring physician: Pollyann Savoy, MD PCP: Benita Stabile, MD  Patient coming from: Home  Chief Complaint: Shortness of breath  HPI: Christopher Warner is a 87 y.o. male with medical history significant for, hyperlipidemia, anxiety, GERD and dementia who presents to the emergency department via EMS due to shortness of breath which started earlier today.  Patient was unable to provide history, history was obtained from ED physician and son at bedside.  Per son, patient lives alone and he has an aide who comes to assist patient during the day, son and daughter live next-door to patient and also monitor patient via camera.  Earlier today, patient was noted to be having increased work of breathing and was short of breath, so EMS was activated, on arrival of EMS team, O2 sat was reported to be in the low 90s, supplemental oxygen via [place] at 2 LPM was provided with improvement in O2 sat (patient does not use supplemental oxygen at baseline).  He was taken to the ED for further evaluation.  Patient ambulates with a cane and walker at baseline.  He denies chest pain, fever, chills, nausea or vomiting.  ED Course:  In the emergency department, he was hemodynamically stable.  Work-up in the ED showed normal CBC except for mild leukocytosis and normal BMP except for BUN to creatinine 25/1.26.  CRP 9.0, fibrinogen 530, D-dimer 5.82, triglycerides 81, lactic acid 1.8 >1.5.  Influenza A, B and SARS coronavirus 2 was negative.  Procalcitonin <0.10. CT angiography of chest with contrast showed bilateral pulmonary emboli, with moderate clot burden.  No evidence of right heart strain.  Trace bilateral pleural effusions noted. Chest x-ray showed no acute abnormality Patient was started on IV heparin drip.  Hospitalist was asked to admit patient for further evaluation and management.  Review of Systems: Constitutional: Negative for  chills and fever.  HENT: Negative for ear pain and sore throat.   Eyes: Negative for pain and visual disturbance.  Respiratory: Positive for shortness of breath.  Negative for cough, chest tightness   Cardiovascular: Negative for chest pain and palpitations.  Gastrointestinal: Negative for abdominal pain and vomiting.  Endocrine: Negative for polyphagia and polyuria.  Genitourinary: Negative for decreased urine volume, dysuria, enuresis Musculoskeletal: Negative for arthralgias and back pain.  Skin: Negative for color change and rash.  Allergic/Immunologic: Negative for immunocompromised state.  Neurological: Negative for tremors, syncope, speech difficulty, weakness, light-headedness and headaches.  Hematological: Does not bruise/bleed easily.  All other systems reviewed and are negative   Past Medical History:  Diagnosis Date  . Arthritis   . Dyslipidemia   . Hypertension   . IBS (irritable bowel syndrome)    With loose stool  . Macular degeneration disease    Past Surgical History:  Procedure Laterality Date  . back surgeries  x   x 3  . ELBOW SURGERY    . KNEE CARTILAGE SURGERY    . SHOULDER SURGERY     rotator cuff repair (rt)  . TOE SURGERY    . TONSILLECTOMY    . TOTAL KNEE ARTHROPLASTY Left 09/03/2015   Procedure: TOTAL KNEE ARTHROPLASTY;  Surgeon: Vickki Hearing, MD;  Location: [facility];  Service: Orthopedics;  Laterality: Left;  . YAG LASER APPLICATION Right 05/29/2012   Procedure: YAG LASER APPLICATION;  Surgeon: Susa Simmonds, MD;  Location: [facility];  Service: Ophthalmology;  Laterality: Right;    Social History:  reports that he has never smoked. He has never used smokeless tobacco. He reports that he does not drink alcohol and does not use drugs.   Allergies  Allergen Reactions  . Sulfa Antibiotics Nausea And Vomiting    Family History  Problem Relation Age of Onset  . Cancer Mother        Breast cancer  . Stroke Mother   . Stroke Father   .  Diabetes Neg Hx   . Hearing loss Neg Hx      Prior to Admission medications   Medication Sig Start Date End Date Taking? Authorizing Provider  amLODipine (NORVASC) 10 MG tablet Take 1 tablet by mouth daily. 07/07/20  Yes [provider]  losartan (COZAAR) 100 MG tablet Take 1 tablet by mouth daily. 07/17/20  Yes [provider]  memantine (NAMENDA) 10 MG tablet Take 10 mg by mouth 2 (two) times daily. 10/03/18  Yes [provider]  QUEtiapine (SEROQUEL) 50 MG tablet Take 50 mg by mouth 2 (two) times daily. 08/01/17  Yes [provider]  simvastatin (ZOCOR) 40 MG tablet Take 20 mg by mouth at bedtime. Reported on 06/09/2015   Yes [provider]  amLODipine (NORVASC) 5 MG tablet Take 5 mg by mouth daily. Patient not taking: No sig reported    [provider]  aspirin EC 325 MG EC tablet Take 1 tablet (325 mg total) by mouth daily with breakfast. Patient not taking: No sig reported 09/05/15   Vickki Hearing, MD  Calcium Carb-Cholecalciferol 600-200 MG-UNIT TABS Take 1 tablet by mouth 2 (two) times daily. Reported on 06/09/2015 Patient not taking: Reported on 07/31/2020    [provider]  cyclobenzaprine (FLEXERIL) 10 MG tablet Take 1 tablet (10 mg total) by mouth 2 (two) times daily as needed for muscle spasms. Patient not taking: No sig reported 08/09/17   Vickki Hearing, MD  fish oil-omega-3 fatty acids 1000 MG capsule Take 1 g by mouth 2 (two) times daily. Reported on 06/09/2015 Patient not taking: Reported on 07/31/2020    [provider]  ibuprofen (ADVIL,MOTRIN) 800 MG tablet Take 1 tablet (800 mg total) by mouth every 8 (eight) hours as needed. Patient not taking: No sig reported 08/09/17   Vickki Hearing, MD  LORazepam (ATIVAN) 0.5 MG tablet Take 0.25 mg by mouth every 8 (eight) hours. Patient not taking: No sig reported    [provider]  losartan (COZAAR) 50 MG tablet Take 50 mg by mouth at bedtime.   Patient not taking: Reported on 07/31/2020    [provider]  Misc Natural Products (BLACK CHERRY CONCENTRATE PO) Take 1 capsule by mouth daily. Patient not taking: Reported on 07/31/2020    [provider]  Multiple Vitamins-Minerals (MULTIVITAMIN WITH MINERALS) tablet Take 1 tablet by mouth at bedtime. Patient not taking: No sig reported    [provider]  Multiple Vitamins-Minerals (PRESERVISION AREDS 2 PO) Take 250-200-40-1 mg-unit-mg-mg by mouth twice a day Patient not taking: No sig reported    [provider]  pantoprazole (PROTONIX) 40 MG tablet Take 1 tablet by mouth daily. Patient not taking: No sig reported 05/05/20   [provider]  polycarbophil (FIBERCON) 625 MG tablet Take 1,250 mg by mouth 2 (two) times daily. Reported on 06/09/2015 Patient not taking: Reported on 07/31/2020    [provider]  Probiotic Product (DIGESTIVE ADVANTAGE) CAPS Take 1 capsule by mouth daily. Patient not taking: Reported on 07/31/2020    [provider]  psyllium (METAMUCIL) 58.6 % powder Take 1 packet by mouth 2 (two) times daily. Reported on 06/09/2015 Patient not taking: Reported on 07/31/2020    [provider]    Physical Exam: BP 125/75   Pulse 88   Temp 100.1 F (37.8 C) (Oral)   Resp 18   Ht 5\' 9"  (1.753 m)   Wt 79.4 kg   SpO2 94%   BMI 25.84 kg/m   . General: 87 y.o. year-old male well developed, well nourished, in no acute distress.  Alert and oriented x3. 95 HEENT: NCAT, EOMI . Neck: Supple, trachea medial . Cardiovascular: Regular rate and rhythm with no rubs or gallops.  No thyromegaly or JVD noted.  2/4 pulses in all 4 extremities. Marland Kitchen Respiratory: Bilateral rales in lower lobes.  No wheezes . Abdomen: Soft nontender nondistended with normal bowel sounds x4 quadrants. . Muskuloskeletal: Bilateral lower extremity trace edema.  No cyanosis or clubbing noted bilaterally . Neuro: CN II-XII intact, sensation, reflexes  intact . Skin: No ulcerative lesions noted or rashes . Psychiatry: Mood is appropriate for condition and setting          Labs on Admission:  Basic Metabolic Panel: Recent Labs  Lab 07/31/20 1535  NA 137  K 4.0  CL 106  CO2 22  GLUCOSE 120*  BUN 25*  CREATININE 1.26*  CALCIUM 8.5*   Liver Function Tests: Recent Labs  Lab 07/31/20 1535  AST 16  ALT 12  ALKPHOS 66  BILITOT 0.7  PROT 6.8  ALBUMIN 3.6   No results for input(s): LIPASE, AMYLASE in the last 168 hours. No results for input(s): AMMONIA in the last 168 hours. CBC: Recent Labs  Lab 07/31/20 1535  WBC 10.8*  NEUTROABS 8.9*  HGB 14.5  HCT 44.2  MCV 98.4  PLT 218   Cardiac Enzymes: No results for input(s): CKTOTAL, CKMB, CKMBINDEX, TROPONINI in the last 168 hours.  BNP (last 3 results) No results for input(s): BNP in the last 8760 hours.  ProBNP (last 3 results) No results for input(s): PROBNP in the last 8760 hours.  CBG: No results for input(s): GLUCAP in the last 168 hours.  Radiological Exams on Admission: CT Angio Chest PE W/Cm &/Or Wo Cm  Result Date: 07/31/2020 CLINICAL DATA:  Short of breath, positive D-dimer, hypoxia EXAM: CT ANGIOGRAPHY CHEST WITH CONTRAST TECHNIQUE: Multidetector CT imaging of the chest was performed using the standard protocol during bolus administration of intravenous contrast. Multiplanar CT image reconstructions and MIPs were obtained to evaluate the vascular anatomy. CONTRAST:  21mL OMNIPAQUE IOHEXOL 350 MG/ML SOLN COMPARISON:  07/31/2020 FINDINGS: Cardiovascular: This is a technically adequate evaluation of the pulmonary vasculature. There are bilateral pulmonary emboli seen within the main pulmonary arteries and extending into the bilateral segmental branches. Overall, clot burden is moderate, though there is no evidence of right ventricular dilatation or right heart strain. The heart is unremarkable without pericardial effusion. Moderate atherosclerosis throughout the  coronary vasculature. No evidence of thoracic aortic aneurysm or dissection. Mild atherosclerosis of the aortic arch. Mediastinum/Nodes: No enlarged mediastinal, hilar, or axillary lymph nodes. Thyroid gland, trachea, and esophagus demonstrate no significant findings. Lungs/Pleura: There are trace bilateral pleural effusions. Mild background emphysema and scarring without airspace disease or pneumothorax. The central airways are patent. Upper Abdomen: No acute abnormality. Musculoskeletal: No acute or destructive bony lesions. Reconstructed images demonstrate no additional findings. Review of the MIP images confirms the above findings. IMPRESSION: 1. Bilateral pulmonary emboli, with moderate clot burden.  No evidence of right heart strain. 2. Trace bilateral pleural effusions. 3. Aortic Atherosclerosis (ICD10-I70.0) and Emphysema (ICD10-J43.9). Critical Value/emergent results were called by telephone at the time of interpretation on 07/31/2020 at 6:42 pm to provider [facility] , who verbally acknowledged these results. Electronically Signed   By: Sharlet SalinaMichael  Brown M.D.   On: 07/31/2020 18:59   DG Chest Port 1 View  Result Date: 07/31/2020 CLINICAL DATA:  Cough and shortness of breath with fevers EXAM: PORTABLE CHEST 1 VIEW COMPARISON:  None. FINDINGS: Cardiac shadow is within normal limits. Aortic calcifications are seen. Lungs are well aerated bilaterally. No focal infiltrate or sizable effusion is seen. No bony abnormality is noted. IMPRESSION: No acute abnormality seen. Electronically Signed   By: Alcide CleverMark  Lukens M.D.   On: 07/31/2020 15:50    EKG: I independently viewed the EKG done and my findings are as followed: Normal sinus rhythm at a rate of 99 bpm  Assessment/Plan Present on Admission: . Pulmonary embolism (HCC) . Essential hypertension . Dyslipidemia  Active Problems:   Essential hypertension   Dyslipidemia   Pulmonary embolism (HCC)   Bilateral pleural effusion   Elevated d-dimer    Leukocytosis   GERD (gastroesophageal reflux disease)   Dementia (HCC)   Insomnia   Anxiety  Shortness of breath secondary to pulmonary embolism Elevated D-dimer Patient presented with shortness of breath, D-dimer was noted to be elevated at 5.82 CT angiography of chest  with contrast showed bilateral pulmonary emboli, with moderate clot burden. Patient's O2 sats was in low 90s on room air at bedside Patient was started on IV heparin drip, she will continue with this at this time with plan to transition to DOAC in the morning. Echocardiogram will be done in the morning  Bilateral pleural effusion CT angiography of chest showed trace bilateral pleural effusions  No known cause for this at this time Continue total input/output, daily weights and fluid restriction Continue Cardiac diet  Echocardiogram in the morning   Leukocytosis possibly reactive WBC 10.8, no concern for any acute infectious process at this time Procalcitonin was < 0.10 Continue to monitor WBC with morning labs  Essential hypertension Continue Norvasc and Cozaar  Dyslipidemia Continue Zocor  Dementia Continue Namenda  Insomnia Continue Seroquel  DVT prophylaxis: Heparin drip  Code Status: Full code  Family Communication: Son at bedside (all questions answered to satisfaction)  Disposition Plan:  Patient is from:                        home Anticipated DC to:                   family members home Anticipated DC date:               1 day Anticipated DC barriers:           Patient requires inpatient management due to PE currently on IV heparin drip  Consults called: None  Admission status: Observation    Frankey Shownladapo Samaira Holzworth MD [facility]  07/31/2020, 8:22 PM

## 2020-07-31 NOTE — Progress Notes (Signed)
ANTICOAGULATION CONSULT NOTE - Initial Consult  Pharmacy Consult for Heparin Indication: pulmonary embolus  Allergies  Allergen Reactions  . Sulfa Antibiotics Nausea And Vomiting    Patient Measurements:   Heparin Dosing Weight: 79.4 kg  Vital Signs: Temp: 100.1 F (37.8 C) (05/05 1454) Temp Source: Oral (05/05 1454) BP: 121/75 (05/05 1830) Pulse Rate: 88 (05/05 1830)  Labs: Recent Labs    07/31/20 1535  HGB 14.5  HCT 44.2  PLT 218  CREATININE 1.26*    CrCl cannot be calculated (Unknown ideal weight.).   Medical History: Past Medical History:  Diagnosis Date  . Arthritis   . Dyslipidemia   . Hypertension   . IBS (irritable bowel syndrome)    With loose stool  . Macular degeneration disease     Assessment: 87 yo male presented on 07/31/2020 with SOB. CTA chest found bilateral PE, moderate clot burden, no RHS. Pharmacy consulted for heparin for PE. No anticoagulation prior to admission. Hgb 14.5. Plt 218.  Goal of Therapy:  Heparin level 0.3-0.7 units/ml Monitor platelets by anticoagulation protocol: Yes   Plan:  Heparin 5000 units x1 bolus  Start heparin 1350 units/hr Check 8 hr heparin level  Monitor heparin level, CBC, and s/s of bleeding daily   Gerrit Halls, PharmD Clinical Pharmacist  07/31/2020,6:53 PM

## 2020-07-31 NOTE — ED Triage Notes (Signed)
Pt to the ED via [facility] from Home with complaints of shortness of breath.   EMS reports his oxygen saturation only drops as low as 92 on RA.  Pt is not on home oxygen.

## 2020-07-31 NOTE — ED Notes (Signed)
Family called nurse into room and pt had removed left forearm IV pulled out. NO infiltration or blood noted at site.

## 2020-07-31 NOTE — ED Notes (Signed)
Pt fidgety trying to pull off leads. Redirection helpful.

## 2020-07-31 NOTE — ED Provider Notes (Signed)
[facility]Y [facility] Provider Note  CSN: [id number] Arrival date & time: 07/31/20 1440    History Chief Complaint  Patient presents with  . Shortness of Breath    HPI  Christopher Warner is a 87 y.o. male presents for evaluation of SOB dry cough onset yesterday. He was reportedly hypoxic to the low 90s with EMS, does not usually wear oxygen or have any breathing problems such as COPD, CHF, etc. He states he lives at home alone, independently, does not have any known sick contacts. States he has not had Covid or Flu vaccines. He denies any chest pain, N/V/D.    Past Medical History:  Diagnosis Date  . Arthritis   . Dyslipidemia   . Hypertension   . IBS (irritable bowel syndrome)    With loose stool  . Macular degeneration disease     Past Surgical History:  Procedure Laterality Date  . back surgeries  x   x 3  . ELBOW SURGERY    . KNEE CARTILAGE SURGERY    . SHOULDER SURGERY     rotator cuff repair (rt)  . TOE SURGERY    . TONSILLECTOMY    . TOTAL KNEE ARTHROPLASTY Left 09/03/2015   Procedure: TOTAL KNEE ARTHROPLASTY;  Surgeon: Vickki HearingStanley E Harrison, MD;  Location: [facility];  Service: Orthopedics;  Laterality: Left;  . YAG LASER APPLICATION Right 05/29/2012   Procedure: YAG LASER APPLICATION;  Surgeon: Susa Simmondsarroll F Haines, MD;  Location: [facility];  Service: Ophthalmology;  Laterality: Right;    Family History  Problem Relation Age of Onset  . Cancer Mother        Breast cancer  . Stroke Mother   . Stroke Father   . Diabetes Neg Hx   . Hearing loss Neg Hx     Social History   Tobacco Use  . Smoking status: Never Smoker  . Smokeless tobacco: Never Used  Vaping Use  . Vaping Use: Never used  Substance Use Topics  . Alcohol use: No  . Drug use: No     Home Medications Prior to Admission medications   Medication Sig Start Date End Date Taking? Authorizing Provider  amLODipine (NORVASC) 10 MG tablet Take 1 tablet by mouth daily. 07/07/20  Yes [provider]  losartan (COZAAR) 100 MG tablet Take 1 tablet by mouth daily. 07/17/20  Yes [provider]  memantine (NAMENDA) 10 MG tablet Take 10 mg by mouth 2 (two) times daily. 10/03/18  Yes [provider]  QUEtiapine (SEROQUEL) 50 MG tablet Take 50 mg by mouth 2 (two) times daily. 08/01/17  Yes [provider]  simvastatin (ZOCOR) 40 MG tablet Take 20 mg by mouth at bedtime. Reported on 06/09/2015   Yes [provider]  amLODipine (NORVASC) 5 MG tablet Take 5 mg by mouth daily. Patient not taking: No sig reported    [provider]  aspirin EC 325 MG EC tablet Take 1 tablet (325 mg total) by mouth daily with breakfast. Patient not taking: No sig reported 09/05/15   Vickki HearingHarrison, Stanley E, MD  Calcium Carb-Cholecalciferol 600-200 MG-UNIT TABS Take 1 tablet by mouth 2 (two) times daily. Reported on 06/09/2015 Patient not taking: Reported on 07/31/2020    [provider]  cyclobenzaprine (FLEXERIL) 10 MG tablet Take 1 tablet (10 mg total) by mouth 2 (two) times daily as needed for muscle spasms. Patient not taking: No sig reported 08/09/17   Vickki HearingHarrison, Stanley E, MD  fish oil-omega-3 fatty acids  1000 MG capsule Take 1 g by mouth 2 (two) times daily. Reported on 06/09/2015 Patient not taking: Reported on 07/31/2020    [provider]  ibuprofen (ADVIL,MOTRIN) 800 MG tablet Take 1 tablet (800 mg total) by mouth every 8 (eight) hours as needed. Patient not taking: No sig reported 08/09/17   Vickki Hearing, MD  LORazepam (ATIVAN) 0.5 MG tablet Take 0.25 mg by mouth every 8 (eight) hours. Patient not taking: No sig reported    [provider]  losartan (COZAAR) 50 MG tablet Take 50 mg by mouth at bedtime.  Patient not taking: Reported on 07/31/2020    [provider]  Misc Natural Products (BLACK CHERRY CONCENTRATE PO) Take 1 capsule by mouth daily. Patient not taking: Reported on 07/31/2020    [provider]  Multiple  Vitamins-Minerals (MULTIVITAMIN WITH MINERALS) tablet Take 1 tablet by mouth at bedtime. Patient not taking: No sig reported    [provider]  Multiple Vitamins-Minerals (PRESERVISION AREDS 2 PO) Take 250-200-40-1 mg-unit-mg-mg by mouth twice a day Patient not taking: No sig reported    [provider]  pantoprazole (PROTONIX) 40 MG tablet Take 1 tablet by mouth daily. Patient not taking: No sig reported 05/05/20   [provider]  polycarbophil (FIBERCON) 625 MG tablet Take 1,250 mg by mouth 2 (two) times daily. Reported on 06/09/2015 Patient not taking: Reported on 07/31/2020    [provider]  Probiotic Product (DIGESTIVE ADVANTAGE) CAPS Take 1 capsule by mouth daily. Patient not taking: Reported on 07/31/2020    [provider]  psyllium (METAMUCIL) 58.6 % powder Take 1 packet by mouth 2 (two) times daily. Reported on 06/09/2015 Patient not taking: Reported on 07/31/2020    [provider]     Allergies    Sulfa antibiotics   Review of Systems   Review of Systems A comprehensive review of systems was completed and negative except as noted in HPI.    Physical Exam BP 121/75   Pulse 88   Temp 100.1 F (37.8 C) (Oral)   Resp (!) 21   SpO2 96%   Physical Exam Vitals and nursing note reviewed.  Constitutional:      Appearance: Normal appearance.  HENT:     Head: Normocephalic and atraumatic.     Nose: Nose normal.     Mouth/Throat:     Mouth: Mucous membranes are moist.  Eyes:     Extraocular Movements: Extraocular movements intact.     Conjunctiva/sclera: Conjunctivae normal.  Cardiovascular:     Rate and Rhythm: Normal rate.  Pulmonary:     Effort: Pulmonary effort is normal. No tachypnea or accessory muscle usage.     Breath sounds: Rales present.  Abdominal:     General: Abdomen is flat.     Palpations: Abdomen is soft.     Tenderness: There is no abdominal tenderness.  Musculoskeletal:        General: No  swelling. Normal range of motion.     Cervical back: Neck supple.     Right lower leg: Edema (trace) present.     Left lower leg: Edema (trace) present.  Skin:    General: Skin is warm and dry.  Neurological:     General: No focal deficit present.     Mental Status: He is alert.  Psychiatric:        Mood and Affect: Mood normal.      ED Results / Procedures / Treatments   Labs (all  labs ordered are listed, but only abnormal results are displayed) Labs Reviewed  CBC WITH DIFFERENTIAL/PLATELET - Abnormal; Notable for the following components:      Result Value   WBC 10.8 (*)    Neutro Abs 8.9 (*)    All other components within normal limits  COMPREHENSIVE METABOLIC PANEL - Abnormal; Notable for the following components:   Glucose, Bld 120 (*)    BUN 25 (*)    Creatinine, Ser 1.26 (*)    Calcium 8.5 (*)    GFR, Estimated 54 (*)    All other components within normal limits  D-DIMER, QUANTITATIVE - Abnormal; Notable for the following components:   D-Dimer, Quant 5.82 (*)    All other components within normal limits  FIBRINOGEN - Abnormal; Notable for the following components:   Fibrinogen 530 (*)    All other components within normal limits  C-REACTIVE PROTEIN - Abnormal; Notable for the following components:   CRP 9.0 (*)    All other components within normal limits  RESP PANEL BY RT-PCR (FLU A&B, COVID) ARPGX2  CULTURE, BLOOD (ROUTINE X 2)  CULTURE, BLOOD (ROUTINE X 2)  LACTIC ACID, PLASMA  LACTIC ACID, PLASMA  PROCALCITONIN  LACTATE DEHYDROGENASE  FERRITIN  TRIGLYCERIDES    EKG EKG Interpretation  Date/Time:  Thursday Jul 31 2020 15:14:08 EDT Ventricular Rate:  99 PR Interval:  143 QRS Duration: 110 QT Interval:  362 QTC Calculation: 460 R Axis:   -14 Text Interpretation: Sinus rhythm Low voltage, precordial leads Abnormal R-wave progression, early transition Since last tracing Rate faster Confirmed by Susy Frizzle [phone]) on 07/31/2020 3:25:32  PM   Radiology DG Chest Port 1 View  Result Date: 07/31/2020 CLINICAL DATA:  Cough and shortness of breath with fevers EXAM: PORTABLE CHEST 1 VIEW COMPARISON:  None. FINDINGS: Cardiac shadow is within normal limits. Aortic calcifications are seen. Lungs are well aerated bilaterally. No focal infiltrate or sizable effusion is seen. No bony abnormality is noted. IMPRESSION: No acute abnormality seen. Electronically Signed   By: Alcide Clever M.D.   On: 07/31/2020 15:50    Procedures .Critical Care Performed by: Pollyann Savoy, MD Authorized by: Pollyann Savoy, MD   Critical care provider statement:    Critical care time (minutes):  45   Critical care was necessary to treat or prevent imminent or life-threatening deterioration of the following conditions:  Circulatory failure and respiratory failure   Critical care was time spent personally by me on the following activities:  Discussions with consultants, evaluation of patient's response to treatment, examination of patient, ordering and performing treatments and interventions, ordering and review of laboratory studies, ordering and review of radiographic studies, pulse oximetry, re-evaluation of patient's condition, obtaining history from patient or surrogate and review of old charts   Care discussed with: admitting provider      Medications Ordered in the ED Medications  iohexol (OMNIPAQUE) 350 MG/ML injection 75 mL (75 mLs Intravenous Contrast Given 07/31/20 1758)     MDM Rules/Calculators/A&P MDM Patient with low grade fever, dry cough and borderline hypoxia. No prior history of same. No recent ED or hospital visits. Will check for CAP, Covid or Flu. Continue [place] oxygen as needed. Covid order set initiated.   ED Course  I have reviewed the triage vital signs and the nursing notes.  Pertinent labs & imaging results that were available during my care of the patient were reviewed by me and considered in my medical decision making  (see chart for details).  Clinical Course as of 07/31/20 1849  Thu Jul 31, 2020  1553 CXR is clear.  [CS]  1557 CBC with mildly elevated WBC. Otherwise normal.  [CS]  1631 CMP is unremarkable. LDH and Lactic acid are neg.  [CS]  1656 Covid/Flu are negative but Dimer is markedly elevated. Will send for CTA to eval PE or occult PNA not seen on CXR.  [CS]  1745 Procalcitonin is neg.  [CS]  1842 Reviewed CT imaging with radiologist. Large clot bilateral PE with no signs of right heart strain. Will initiate heparin per protocol and discuss admission with the hospitalist.  [CS]  1849 Discussed CTA results with patient and son at bedside. They understand the admission process. Questions answered.  [CS]    Clinical Course User Index [CS] Pollyann Savoy, MD    Final Clinical Impression(s) / ED Diagnoses Final diagnoses:  Acute pulmonary embolism without acute cor pulmonale, unspecified pulmonary embolism type [facility])    Rx / DC Orders ED Discharge Orders    None       Pollyann Savoy, MD 07/31/20 2041

## 2020-08-01 ENCOUNTER — Observation Stay (HOSPITAL_BASED_OUTPATIENT_CLINIC_OR_DEPARTMENT_OTHER): Payer: Medicare HMO

## 2020-08-01 DIAGNOSIS — F419 Anxiety disorder, unspecified: Secondary | ICD-10-CM | POA: Diagnosis present

## 2020-08-01 DIAGNOSIS — H3530 Unspecified macular degeneration: Secondary | ICD-10-CM | POA: Diagnosis present

## 2020-08-01 DIAGNOSIS — I129 Hypertensive chronic kidney disease with stage 1 through stage 4 chronic kidney disease, or unspecified chronic kidney disease: Secondary | ICD-10-CM | POA: Diagnosis present

## 2020-08-01 DIAGNOSIS — E782 Mixed hyperlipidemia: Secondary | ICD-10-CM | POA: Diagnosis present

## 2020-08-01 DIAGNOSIS — J9601 Acute respiratory failure with hypoxia: Secondary | ICD-10-CM | POA: Diagnosis not present

## 2020-08-01 DIAGNOSIS — Z96652 Presence of left artificial knee joint: Secondary | ICD-10-CM | POA: Diagnosis present

## 2020-08-01 DIAGNOSIS — R0602 Shortness of breath: Secondary | ICD-10-CM

## 2020-08-01 DIAGNOSIS — K589 Irritable bowel syndrome without diarrhea: Secondary | ICD-10-CM | POA: Diagnosis present

## 2020-08-01 DIAGNOSIS — J90 Pleural effusion, not elsewhere classified: Secondary | ICD-10-CM | POA: Diagnosis present

## 2020-08-01 DIAGNOSIS — Z66 Do not resuscitate: Secondary | ICD-10-CM | POA: Diagnosis present

## 2020-08-01 DIAGNOSIS — Z79899 Other long term (current) drug therapy: Secondary | ICD-10-CM | POA: Diagnosis not present

## 2020-08-01 DIAGNOSIS — I2699 Other pulmonary embolism without acute cor pulmonale: Secondary | ICD-10-CM | POA: Diagnosis present

## 2020-08-01 DIAGNOSIS — F0390 Unspecified dementia without behavioral disturbance: Secondary | ICD-10-CM | POA: Diagnosis present

## 2020-08-01 DIAGNOSIS — R7989 Other specified abnormal findings of blood chemistry: Secondary | ICD-10-CM | POA: Diagnosis not present

## 2020-08-01 DIAGNOSIS — K219 Gastro-esophageal reflux disease without esophagitis: Secondary | ICD-10-CM | POA: Diagnosis present

## 2020-08-01 DIAGNOSIS — I10 Essential (primary) hypertension: Secondary | ICD-10-CM | POA: Diagnosis not present

## 2020-08-01 DIAGNOSIS — G4700 Insomnia, unspecified: Secondary | ICD-10-CM | POA: Diagnosis present

## 2020-08-01 DIAGNOSIS — Z882 Allergy status to sulfonamides status: Secondary | ICD-10-CM | POA: Diagnosis not present

## 2020-08-01 DIAGNOSIS — Z20822 Contact with and (suspected) exposure to covid-19: Secondary | ICD-10-CM | POA: Diagnosis present

## 2020-08-01 DIAGNOSIS — N1831 Chronic kidney disease, stage 3a: Secondary | ICD-10-CM | POA: Diagnosis present

## 2020-08-01 DIAGNOSIS — Z7982 Long term (current) use of aspirin: Secondary | ICD-10-CM | POA: Diagnosis not present

## 2020-08-01 LAB — ECHOCARDIOGRAM COMPLETE
AR max vel: 3.92 cm2
AV Area VTI: 4.15 cm2
AV Area mean vel: 3.88 cm2
AV Mean grad: 3.7 mmHg
AV Peak grad: 7.3 mmHg
Ao pk vel: 1.35 m/s
Area-P 1/2: 3.5 cm2
Height: 69 in
S' Lateral: 2.75 cm
Weight: 2800 oz

## 2020-08-01 LAB — COMPREHENSIVE METABOLIC PANEL
ALT: 10 U/L (ref 0–44)
AST: 13 U/L — ABNORMAL LOW (ref 15–41)
Albumin: 3 g/dL — ABNORMAL LOW (ref 3.5–5.0)
Alkaline Phosphatase: 56 U/L (ref 38–126)
Anion gap: 6 (ref 5–15)
BUN: 20 mg/dL (ref 8–23)
CO2: 24 mmol/L (ref 22–32)
Calcium: 8.1 mg/dL — ABNORMAL LOW (ref 8.9–10.3)
Chloride: 106 mmol/L (ref 98–111)
Creatinine, Ser: 1.11 mg/dL (ref 0.61–1.24)
GFR, Estimated: >60 mL/min (ref >60)
Glucose, Bld: 101 mg/dL — ABNORMAL HIGH (ref 70–99)
Potassium: 3.8 mmol/L (ref 3.5–5.1)
Sodium: 136 mmol/L (ref 135–145)
Total Bilirubin: 1.1 mg/dL (ref 0.3–1.2)
Total Protein: 5.9 g/dL — ABNORMAL LOW (ref 6.5–8.1)

## 2020-08-01 LAB — BLOOD CULTURE ID PANEL (REFLEXED) - BCID2

## 2020-08-01 LAB — CBC
HCT: 38.7 % — ABNORMAL LOW (ref 39.0–52.0)
Hemoglobin: 12.8 g/dL — ABNORMAL LOW (ref 13.0–17.0)
MCH: 32.2 pg (ref 26.0–34.0)
MCHC: 33.1 g/dL (ref 30.0–36.0)
MCV: 97.5 fL (ref 80.0–100.0)
Platelets: 197 10*3/uL (ref 150–400)
RBC: 3.97 MIL/uL — ABNORMAL LOW (ref 4.22–5.81)
RDW: 13.2 % (ref 11.5–15.5)
WBC: 7.9 10*3/uL (ref 4.0–10.5)
nRBC: 0 % (ref 0.0–0.2)

## 2020-08-01 LAB — HEPARIN LEVEL (UNFRACTIONATED)
Heparin Unfractionated: 0.64 IU/mL (ref 0.30–0.70)
Heparin Unfractionated: 0.78 IU/mL — ABNORMAL HIGH (ref 0.30–0.70)
Heparin Unfractionated: 0.47 IU/mL (ref 0.30–0.70)

## 2020-08-01 LAB — PROTIME-INR
INR: 1.3 — ABNORMAL HIGH (ref 0.8–1.2)
Prothrombin Time: 15.7 seconds — ABNORMAL HIGH (ref 11.4–15.2)

## 2020-08-01 LAB — MAGNESIUM: Magnesium: 1.8 mg/dL (ref 1.7–2.4)

## 2020-08-01 LAB — APTT: aPTT: 76 seconds — ABNORMAL HIGH (ref 24–36)

## 2020-08-01 NOTE — Progress Notes (Signed)
9:01 PM RN called due to being notified by [facility] micro lab that BCID was positive for staphylococcal species.  This was positive in 1 bottle on verification.  Blood culture results pending per micro lab.  Patient is currently stable, afebrile, WBC this morning was normal, procalcitonin yesterday (5/5) was < 0.10.  We shall continue to monitor at this time.

## 2020-08-01 NOTE — Progress Notes (Signed)
ANTICOAGULATION CONSULT NOTE -   Pharmacy Consult for Heparin Indication: pulmonary embolus  Allergies  Allergen Reactions  . Sulfa Antibiotics Nausea And Vomiting    Patient Measurements: Height: 5\' 9"  (175.3 cm) Weight: 79.4 kg (175 lb) IBW/kg (Calculated) : 70.7 Heparin Dosing Weight: 79.4 kg  Vital Signs: Temp: 97.5 F (36.4 C) (05/06 0353) BP: 103/67 (05/06 0353) Pulse Rate: 70 (05/06 0353)  Labs: Recent Labs    07/31/20 1535 07/31/20 1956 07/31/20 2123 08/01/20 0534  HGB 14.5  --   --  12.8*  HCT 44.2  --   --  38.7*  PLT 218  --   --  197  APTT  --   --   --  76*  LABPROT  --   --   --  15.7*  INR  --   --   --  1.3*  HEPARINUNFRC  --   --   --  0.64  CREATININE 1.26*  --   --  1.11  TROPONINIHS  --  54* 55*  --     Estimated Creatinine Clearance: 44.2 mL/min (by C-G formula based on SCr of 1.11 mg/dL).   Medical History: Past Medical History:  Diagnosis Date  . Arthritis   . Dyslipidemia   . Hypertension   . IBS (irritable bowel syndrome)    With loose stool  . Macular degeneration disease     Assessment: 87 yo male presented on 07/31/2020 with SOB. CTA chest found bilateral PE, moderate clot burden, no RHS. Pharmacy consulted for heparin for PE. No anticoagulation prior to admission. Hgb 14.5. Plt 218.  HL 0.64, therapeutic  Goal of Therapy:  Heparin level 0.3-0.7 units/ml Monitor platelets by anticoagulation protocol: Yes   Plan:  Continue heparin infusion at 1350 units/hr Check 8 hr heparin level and DHL Monitor heparin level, CBC, and s/s of bleeding daily   09/30/2020, BS Elder Cyphers, BCPS Clinical Pharmacist Pager [phone] 08/01/2020,7:52 AM

## 2020-08-01 NOTE — Progress Notes (Signed)
PROGRESS NOTE  Christopher Warner XIP:[id number] DOB: [birth year] DOA: 07/31/2020 PCP: Benita Stabile, MD  Brief History:  87 year old male with a history of hyperlipidemia, anxiety, dementia, GERD presenting with shortness of breath on 5/22.  The patient had been in his usual state of health without any complaints.  His daily caretaker noted him to be more short of breath after coming back to the bathroom on the morning of 07/31/2020.  As result, EMS was activated.  The patient is unable to provide any significant history due to his dementia.  Nevertheless, history supplemented by the patient's son and daughter does not reveal any new medications, recent surgeries, or recent falls.  At baseline, the patient requires assistance with feeding and transfers.  He is able to ambulate with a cane.  He is pleasantly confused.  There is been no documented fevers, chills, headache, chest pain, nausea, vomiting or diarrhea, abdominal pain. In the emergency department, the patient had a low-grade temperature of 100.1 F with oxygen saturation 90% on room air.  He was hemodynamically stable.  BMP was unremarkable with serum creatinine of 1.26 which is at his baseline.  LFTs were unremarkable.  WBC 10.8, hemoglobin 14.5, platelets 218,000.  CTA chest showed bilateral pulmonary emboli in the main pulmonary artery.  There is no RV strain.  There are trace bilateral pleural effusions.  Assessment/Plan: Acute respiratory failure with hypoxia -Secondary to pulmonary embolus -Stable on 2 L nasal cannula -Wean oxygen for saturation greater 90%  Acute pulmonary embolus -Unprovoked by clinical history -Continue IV heparin -Echocardiogram  Essential hypertension -Holding amlodipine and losartan -Blood pressure remains well controlled  Dementia without behavioral disturbance -Continue Seroquel and Namenda  Mixed hyperlipidemia -Continue statin  CKD stage IIIa -Baseline creatinine 1.1-1.2  Elevated  troponin -Secondary to pulmonary embolus -Troponin 54>> 55 -No chest pain presently  Pleural Effusion -reactive from PE -stable on 2L  Goals of Care -discussed with son and daughter at bedside -confirmed DNR      Status is: Observation  The patient will require care spanning > 2 midnights and should be moved to inpatient because: Hemodynamically unstable and IV treatments appropriate due to intensity of illness or inability to take PO  Dispo: The patient is from: Home              Anticipated d/c is to: Home              Patient currently is not medically stable to d/c.   Difficult to place patient No        Family Communication:   Son and daughter updated at bedside 5/6  Consultants:  none  Code Status:  DNR  DVT Prophylaxis:  IV Heparin    Procedures: As Listed in Progress Note Above  Antibiotics: None       Subjective: Patient denies fevers, chills, headache, chest pain, dyspnea, nausea, vomiting, diarrhea, abdominal pain, dysuria, hematuria, hematochezia, and melena.   Objective: Vitals:   07/31/20 1900 07/31/20 1902 07/31/20 2045 08/01/20 0353  BP: 125/75  117/74 103/67  Pulse: 88  85 70  Resp: 18  20 19   Temp:   (!) 97.3 F (36.3 C) (!) 97.5 F (36.4 C)  TempSrc:      SpO2: 94%  92% 90%  Weight:  79.4 kg    Height:  5\' 9"  (1.753 m)      Intake/Output Summary (Last 24 hours) at 08/01/2020 Last data filed at  08/01/2020 0500 Gross per 24 hour  Intake --  Output 550 ml  Net -550 ml   Weight change:  Exam:   General:  Pt is alert, follows commands appropriately, not in acute distress  HEENT: No icterus, No thrush, No neck mass, [place]/AT  Cardiovascular: RRR, S1/S2, no rubs, no gallops  Respiratory: bibasilar crackles. No wheeze  Abdomen: Soft/+BS, non tender, non distended, no guarding  Extremities: No edema, No lymphangitis, No petechiae, No rashes, no synovitis   Data Reviewed: I have personally reviewed following labs  and imaging studies Basic Metabolic Panel: Recent Labs  Lab 07/31/20 1535 07/31/20 2123 08/01/20 0534  NA 137  --  136  K 4.0  --  3.8  CL 106  --  106  CO2 22  --  24  GLUCOSE 120*  --  101*  BUN 25*  --  20  CREATININE 1.26*  --  1.11  CALCIUM 8.5*  --  8.1*  MG  --   --  1.8  PHOS  --  3.0  --    Liver Function Tests: Recent Labs  Lab 07/31/20 1535 08/01/20 0534  AST 16 13*  ALT 12 10  ALKPHOS 66 56  BILITOT 0.7 1.1  PROT 6.8 5.9*  ALBUMIN 3.6 3.0*   No results for input(s): LIPASE, AMYLASE in the last 168 hours. No results for input(s): AMMONIA in the last 168 hours. Coagulation Profile: Recent Labs  Lab 08/01/20 0534  INR 1.3*   CBC: Recent Labs  Lab 07/31/20 1535 08/01/20 0534  WBC 10.8* 7.9  NEUTROABS 8.9*  --   HGB 14.5 12.8*  HCT 44.2 38.7*  MCV 98.4 97.5  PLT 218 197   Cardiac Enzymes: No results for input(s): CKTOTAL, CKMB, CKMBINDEX, TROPONINI in the last 168 hours. BNP: Invalid input(s): POCBNP CBG: No results for input(s): GLUCAP in the last 168 hours. HbA1C: No results for input(s): HGBA1C in the last 72 hours. Urine analysis:    Component Value Date/Time   COLORURINE YELLOW 01/23/2007 1304   APPEARANCEUR CLEAR 01/23/2007 1304   LABSPEC 1.010 01/23/2007 1304   PHURINE 7.0 01/23/2007 1304   GLUCOSEU NEGATIVE 01/23/2007 1304   HGBUR NEGATIVE 01/23/2007 1304   BILIRUBINUR NEGATIVE 01/23/2007 1304   KETONESUR NEGATIVE 01/23/2007 1304   PROTEINUR NEGATIVE 01/23/2007 1304   UROBILINOGEN 0.2 01/23/2007 1304   NITRITE NEGATIVE 01/23/2007 1304   LEUKOCYTESUR  01/23/2007 1304    NEGATIVE MICROSCOPIC NOT DONE ON URINES WITH NEGATIVE PROTEIN, BLOOD, LEUKOCYTES, NITRITE, OR GLUCOSE <1000 mg/dL.   Sepsis Labs: @LABRCNTIP (procalcitonin:4,lacticidven:4) ) Recent Results (from the past 240 hour(s))  Resp Panel by RT-PCR (Flu A&B, Covid) Nasopharyngeal Swab     Status: None   Collection Time: 07/31/20  3:06 PM   Specimen: Nasopharyngeal  Swab; Nasopharyngeal(NP) swabs in vial transport medium  Result Value Ref Range Status   SARS Coronavirus 2 by RT PCR NEGATIVE NEGATIVE Final    Comment: (NOTE) SARS-CoV-2 target nucleic acids are NOT DETECTED.  The SARS-CoV-2 RNA is generally detectable in upper respiratory specimens during the acute phase of infection. The lowest concentration of SARS-CoV-2 viral copies this assay can detect is 138 copies/mL. A negative result does not preclude SARS-Cov-2 infection and should not be used as the sole basis for treatment or other patient management decisions. A negative result may occur with  improper specimen collection/handling, submission of specimen other than nasopharyngeal swab, presence of viral mutation(s) within the areas targeted by this assay, and inadequate number of viral copies(<138  copies/mL). A negative result must be combined with clinical observations, patient history, and epidemiological information. The expected result is Negative.  Fact Sheet for Patients:  BloggerCourse.com  Fact Sheet for Healthcare Providers:  SeriousBroker.it  This test is no t yet approved or cleared by the [place] FDA and  has been authorized for detection and/or diagnosis of SARS-CoV-2 by FDA under an Emergency Use Authorization (EUA). This EUA will remain  in effect (meaning this test can be used) for the duration of the COVID-19 declaration under Section 564(b)(1) of the Act, 21 U.S.C.section 360bbb-3(b)(1), unless the authorization is terminated  or revoked sooner.       Influenza A by PCR NEGATIVE NEGATIVE Final   Influenza B by PCR NEGATIVE NEGATIVE Final    Comment: (NOTE) The Xpert Xpress SARS-CoV-2/FLU/RSV plus assay is intended as an aid in the diagnosis of influenza from Nasopharyngeal swab specimens and should not be used as a sole basis for treatment. Nasal washings and aspirates are unacceptable for Xpert Xpress  SARS-CoV-2/FLU/RSV testing.  Fact Sheet for Patients: BloggerCourse.com  Fact Sheet for Healthcare Providers: SeriousBroker.it  This test is not yet approved or cleared by the [place] FDA and has been authorized for detection and/or diagnosis of SARS-CoV-2 by FDA under an Emergency Use Authorization (EUA). This EUA will remain in effect (meaning this test can be used) for the duration of the COVID-19 declaration under Section 564(b)(1) of the Act, 21 U.S.C. section 360bbb-3(b)(1), unless the authorization is terminated or revoked.  Performed at [facility], [address] [place], [place] [address]   Blood Culture (routine x 2)     Status: None (Preliminary result)   Collection Time: 07/31/20  3:35 PM   Specimen: BLOOD  Result Value Ref Range Status   Specimen Description BLOOD RIGHT HAND  Final   Special Requests   Final    BOTTLES DRAWN AEROBIC AND ANAEROBIC Blood Culture adequate volume   Culture   Final    NO GROWTH < 24 HOURS Performed at [facility], [address], [place], [place] [address]    Report Status PENDING  Incomplete  Blood Culture (routine x 2)     Status: None (Preliminary result)   Collection Time: 07/31/20  3:35 PM   Specimen: BLOOD  Result Value Ref Range Status   Specimen Description BLOOD LEFT ARM  Final   Special Requests   Final    BOTTLES DRAWN AEROBIC AND ANAEROBIC Blood Culture adequate volume   Culture   Final    NO GROWTH < 24 HOURS Performed at [facility], [address], [place], [place] [address]    Report Status PENDING  Incomplete     Scheduled Meds: . memantine  10 mg Oral BID  . QUEtiapine  50 mg Oral BID  . simvastatin  20 mg Oral QHS   Continuous Infusions: . heparin 1,350 Units/hr (07/31/20 2017)    Procedures/Studies: CT Angio Chest PE W/Cm &/Or Wo Cm  Result Date: 07/31/2020 CLINICAL DATA:  Short of breath, positive D-dimer, hypoxia EXAM: CT ANGIOGRAPHY CHEST  WITH CONTRAST TECHNIQUE: Multidetector CT imaging of the chest was performed using the standard protocol during bolus administration of intravenous contrast. Multiplanar CT image reconstructions and MIPs were obtained to evaluate the vascular anatomy. CONTRAST:  74mL OMNIPAQUE IOHEXOL 350 MG/ML SOLN COMPARISON:  07/31/2020 FINDINGS: Cardiovascular: This is a technically adequate evaluation of the pulmonary vasculature. There are bilateral pulmonary emboli seen within the main pulmonary arteries and extending into the bilateral segmental  branches. Overall, clot burden is moderate, though there is no evidence of right ventricular dilatation or right heart strain. The heart is unremarkable without pericardial effusion. Moderate atherosclerosis throughout the coronary vasculature. No evidence of thoracic aortic aneurysm or dissection. Mild atherosclerosis of the aortic arch. Mediastinum/Nodes: No enlarged mediastinal, hilar, or axillary lymph nodes. Thyroid gland, trachea, and esophagus demonstrate no significant findings. Lungs/Pleura: There are trace bilateral pleural effusions. Mild background emphysema and scarring without airspace disease or pneumothorax. The central airways are patent. Upper Abdomen: No acute abnormality. Musculoskeletal: No acute or destructive bony lesions. Reconstructed images demonstrate no additional findings. Review of the MIP images confirms the above findings. IMPRESSION: 1. Bilateral pulmonary emboli, with moderate clot burden. No evidence of right heart strain. 2. Trace bilateral pleural effusions. 3. Aortic Atherosclerosis (ICD10-I70.0) and Emphysema (ICD10-J43.9). Critical Value/emergent results were called by telephone at the time of interpretation on 07/31/2020 at 6:42 pm to [facility] , who verbally acknowledged these results. Electronically Signed   By: Sharlet SalinaMichael  Brown M.D.   On: 07/31/2020 18:59   DG Chest Port 1 View  Result Date: 07/31/2020 CLINICAL DATA:  Cough  and shortness of breath with fevers EXAM: PORTABLE CHEST 1 VIEW COMPARISON:  None. FINDINGS: Cardiac shadow is within normal limits. Aortic calcifications are seen. Lungs are well aerated bilaterally. No focal infiltrate or sizable effusion is seen. No bony abnormality is noted. IMPRESSION: No acute abnormality seen. Electronically Signed   By: Alcide CleverMark  Lukens M.D.   On: 07/31/2020 15:50    Catarina Hartshornavid Karrington Mccravy, DO  [facility]  If 7PM-7AM, please contact night-coverage [email or web address] Password [email or web address] 08/01/2020, 9:07 AM   LOS: 0 days

## 2020-08-01 NOTE — Progress Notes (Signed)
ANTICOAGULATION CONSULT NOTE -   Pharmacy Consult for Heparin Indication: pulmonary embolus  Allergies  Allergen Reactions  . Sulfa Antibiotics Nausea And Vomiting    Patient Measurements: Height: 5\' 9"  (175.3 cm) Weight: 79.4 kg (175 lb) IBW/kg (Calculated) : 70.7 Heparin Dosing Weight: 79.4 kg  Vital Signs: Temp: 97.5 F (36.4 C) (05/06 0353) BP: 103/67 (05/06 0353) Pulse Rate: 70 (05/06 0353)  Labs: Recent Labs    07/31/20 1535 07/31/20 1956 07/31/20 2123 08/01/20 0534 08/01/20 1304  HGB 14.5  --   --  12.8*  --   HCT 44.2  --   --  38.7*  --   PLT 218  --   --  197  --   APTT  --   --   --  76*  --   LABPROT  --   --   --  15.7*  --   INR  --   --   --  1.3*  --   HEPARINUNFRC  --   --   --  0.64 0.78*  CREATININE 1.26*  --   --  1.11  --   TROPONINIHS  --  54* 55*  --   --     Estimated Creatinine Clearance: 44.2 mL/min (by C-G formula based on SCr of 1.11 mg/dL).   Medical History: Past Medical History:  Diagnosis Date  . Arthritis   . Dyslipidemia   . Hypertension   . IBS (irritable bowel syndrome)    With loose stool  . Macular degeneration disease     Assessment: 87 yo male presented on 07/31/2020 with SOB. CTA chest found bilateral PE, moderate clot burden, no RHS. Pharmacy consulted for heparin for PE. No anticoagulation prior to admission. Hgb 14.5. Plt 218.  HL 0.78, supratherapeutic  Goal of Therapy:  Heparin level 0.3-0.7 units/ml Monitor platelets by anticoagulation protocol: Yes   Plan:  Decrease heparin infusion to 1250 units/hr Check 8 hr heparin level and DHL Monitor heparin level, CBC, and s/s of bleeding daily   09/30/2020, BS Elder Cyphers, BCPS Clinical Pharmacist Pager [phone] 08/01/2020,2:17 PM

## 2020-08-01 NOTE — Progress Notes (Signed)
  Echocardiogram 2D Echocardiogram has been performed.  Pieter Partridge 08/01/2020, 9:42 AM

## 2020-08-01 NOTE — Progress Notes (Signed)
ANTICOAGULATION CONSULT NOTE   Pharmacy Consult for Heparin Indication: pulmonary embolus  Allergies  Allergen Reactions  . Sulfa Antibiotics Nausea And Vomiting    Patient Measurements: Height: 5\' 9"  (175.3 cm) Weight: 79.4 kg (175 lb) IBW/kg (Calculated) : 70.7 Heparin Dosing Weight: 79.4 kg  Vital Signs: Temp: 97.3 F (36.3 C) (05/06 1926) Temp Source: Oral (05/06 1926) BP: 109/66 (05/06 1926) Pulse Rate: 66 (05/06 1926)  Labs: Recent Labs    07/31/20 1535 07/31/20 1956 07/31/20 2123 08/01/20 0534 08/01/20 1304 08/01/20 2208  HGB 14.5  --   --  12.8*  --   --   HCT 44.2  --   --  38.7*  --   --   PLT 218  --   --  197  --   --   APTT  --   --   --  76*  --   --   LABPROT  --   --   --  15.7*  --   --   INR  --   --   --  1.3*  --   --   HEPARINUNFRC  --   --   --  0.64 0.78* 0.47  CREATININE 1.26*  --   --  1.11  --   --   TROPONINIHS  --  54* 55*  --   --   --     Estimated Creatinine Clearance: 44.2 mL/min (by C-G formula based on SCr of 1.11 mg/dL).   Medical History: Past Medical History:  Diagnosis Date  . Arthritis   . Dyslipidemia   . Hypertension   . IBS (irritable bowel syndrome)    With loose stool  . Macular degeneration disease     Assessment: 87 yo male presented on 07/31/2020 with SOB. CTA chest found bilateral PE, moderate clot burden, no RHS. Pharmacy consulted for heparin for PE. No anticoagulation prior to admission. Hgb 14.5. Plt 218.  HL 0.47 on 1250 units/hr of heparin. No bleeding issues noted.   Goal of Therapy:  Heparin level 0.3-0.7 units/ml Monitor platelets by anticoagulation protocol: Yes   Plan:  Continue heparin infusion at 1250 units/hr Monitor heparin level, CBC, and s/s of bleeding daily   09/30/2020 PharmD., BCPS Clinical Pharmacist 08/01/2020 10:36 PM

## 2020-08-01 NOTE — Progress Notes (Signed)
MD Adefsco notified via secure chat and page of positive staph bcid blood cultures at this time.

## 2020-08-02 DIAGNOSIS — F0390 Unspecified dementia without behavioral disturbance: Secondary | ICD-10-CM

## 2020-08-02 LAB — CULTURE, BLOOD (ROUTINE X 2): Special Requests: ADEQUATE

## 2020-08-02 LAB — BASIC METABOLIC PANEL
Anion gap: 6 (ref 5–15)
BUN: 17 mg/dL (ref 8–23)
CO2: 24 mmol/L (ref 22–32)
Calcium: 8.4 mg/dL — ABNORMAL LOW (ref 8.9–10.3)
Chloride: 107 mmol/L (ref 98–111)
Creatinine, Ser: 0.92 mg/dL (ref 0.61–1.24)
GFR, Estimated: >60 mL/min (ref >60)
Glucose, Bld: 97 mg/dL (ref 70–99)
Potassium: 3.8 mmol/L (ref 3.5–5.1)
Sodium: 137 mmol/L (ref 135–145)

## 2020-08-02 LAB — CBC
HCT: 40.1 % (ref 39.0–52.0)
Hemoglobin: 13.2 g/dL (ref 13.0–17.0)
MCH: 32 pg (ref 26.0–34.0)
MCHC: 32.9 g/dL (ref 30.0–36.0)
MCV: 97.3 fL (ref 80.0–100.0)
Platelets: 204 10*3/uL (ref 150–400)
RBC: 4.12 MIL/uL — ABNORMAL LOW (ref 4.22–5.81)
RDW: 12.8 % (ref 11.5–15.5)
WBC: 8.6 10*3/uL (ref 4.0–10.5)
nRBC: 0 % (ref 0.0–0.2)

## 2020-08-02 LAB — HEPARIN LEVEL (UNFRACTIONATED): Heparin Unfractionated: 0.48 IU/mL (ref 0.30–0.70)

## 2020-08-02 NOTE — Progress Notes (Signed)
ANTICOAGULATION CONSULT NOTE   Pharmacy Consult for Heparin Indication: pulmonary embolus  Allergies  Allergen Reactions  . Sulfa Antibiotics Nausea And Vomiting    Patient Measurements: Height: 5\' 9"  (175.3 cm) Weight: 85.1 kg (187 lb 9.8 oz) IBW/kg (Calculated) : 70.7 Heparin Dosing Weight: 79.4 kg  Vital Signs: Temp: 97.6 F (36.4 C) (05/07 0527) Temp Source: Oral (05/07 0527) BP: 134/70 (05/07 0527) Pulse Rate: 65 (05/07 0527)  Labs: Recent Labs    07/31/20 1535 07/31/20 1535 07/31/20 1956 07/31/20 2123 08/01/20 0534 08/01/20 1304 08/01/20 2208 08/02/20 0653  HGB 14.5  --   --   --  12.8*  --   --  13.2  HCT 44.2  --   --   --  38.7*  --   --  40.1  PLT 218  --   --   --  197  --   --  204  APTT  --   --   --   --  76*  --   --   --   LABPROT  --   --   --   --  15.7*  --   --   --   INR  --   --   --   --  1.3*  --   --   --   HEPARINUNFRC  --    < >  --   --  0.64 0.78* 0.47 0.48  CREATININE 1.26*  --   --   --  1.11  --   --  0.92  TROPONINIHS  --   --  54* 55*  --   --   --   --    < > = values in this interval not displayed.    Estimated Creatinine Clearance: 57.7 mL/min (by C-G formula based on SCr of 0.92 mg/dL).   Medical History: Past Medical History:  Diagnosis Date  . Arthritis   . Dyslipidemia   . Hypertension   . IBS (irritable bowel syndrome)    With loose stool  . Macular degeneration disease     Assessment: 87 yo male presented on 07/31/2020 with SOB. CTA chest found bilateral PE, moderate clot burden, no RHS. Pharmacy consulted for heparin for PE. No anticoagulation prior to admission. Hgb 14.5. Plt 218.  HL 0.48 on 1250 units/hr of heparin. No bleeding issues noted.   Goal of Therapy:  Heparin level 0.3-0.7 units/ml Monitor platelets by anticoagulation protocol: Yes   Plan:  Continue heparin infusion at 1250 units/hr Monitor heparin level, CBC, and s/s of bleeding daily   09/30/2020, PharmD, MBA, BCGP Clinical  Pharmacist  08/02/2020 10:27 AM

## 2020-08-02 NOTE — Progress Notes (Signed)
PROGRESS NOTE  RASHAAN WYLES ZOX:[id number] DOB: [birth year] DOA: 07/31/2020 PCP: Benita Stabile, MD  Brief History:  87 year old male with a history of hyperlipidemia, anxiety, dementia, GERD presenting with shortness of breath on 5/22.  The patient had been in his usual state of health without any complaints.  His daily caretaker noted him to be more short of breath after coming back to the bathroom on the morning of 07/31/2020.  As result, EMS was activated.  The patient is unable to provide any significant history due to his dementia.  Nevertheless, history supplemented by the patient's son and daughter does not reveal any new medications, recent surgeries, or recent falls.  At baseline, the patient requires assistance with feeding and transfers.  He is able to ambulate with a cane.  He is pleasantly confused.  There is been no documented fevers, chills, headache, chest pain, nausea, vomiting or diarrhea, abdominal pain. In the emergency department, the patient had a low-grade temperature of 100.1 F with oxygen saturation 90% on room air.  He was hemodynamically stable.  BMP was unremarkable with serum creatinine of 1.26 which is at his baseline.  LFTs were unremarkable.  WBC 10.8, hemoglobin 14.5, platelets 218,000.  CTA chest showed bilateral pulmonary emboli in the main pulmonary artery.  There is no RV strain.  There are trace bilateral pleural effusions.  Assessment/Plan: Acute respiratory failure with hypoxia -Secondary to pulmonary embolus -Stable on 2 L nasal cannula>>94% -Wean oxygen for saturation greater 90%  Acute pulmonary embolus -Unprovoked by clinical history -Continue IV heparin -Echo EF 60-65%; grossly normal RV, G1DD  Essential hypertension -Holding amlodipine and losartan -Blood pressure remains well controlled  Dementia without behavioral disturbance -Continue Seroquel and Namenda  Mixed hyperlipidemia -Continue statin  CKD stage IIIa -Baseline  creatinine 1.1-1.2  Elevated troponin -Secondary to pulmonary embolus -Troponin 54>> 55 -No chest pain presently  Pleural Effusion -reactive from PE -stable on 2L  Goals of Care -discussed with son and daughter at bedside -confirmed DNR      Status is: Observation  The patient will require care spanning > 2 midnights and should be moved to inpatient because: Hemodynamically unstable and IV treatments appropriate due to intensity of illness or inability to take PO  Dispo: The patient is from: Home  Anticipated d/c is to: Home  Patient currently is not medically stable to d/c.              Difficult to place patient No        Family Communication:   Son and daughter updated at bedside 5/6  Consultants:  none  Code Status:  DNR  DVT Prophylaxis:  IV Heparin    Procedures: As Listed in Progress Note Above  Antibiotics: None     Subjective: Patient denies fevers, chills, headache, chest pain, dyspnea, nausea, vomiting, diarrhea, abdominal pain, dysuria, hematuria, hematochezia, and melena.   Objective: Vitals:   08/01/20 1926 08/02/20 0338 08/02/20 0527 08/02/20 1350  BP: 109/66  134/70 (!) 148/93  Pulse: 66  65 (!) 55  Resp: Temp: (!) 97.3 F (36.3 C)  97.6 F (36.4 C)   TempSrc: Oral  Oral   SpO2: 92%  92% 94%  Weight:  85.1 kg    Height:        Intake/Output Summary (Last 24 hours) at 08/02/2020 1609 Last data filed at 08/02/2020 0830 Gross per 24 hour  Intake 791.49 ml  Output 750 ml  Net 41.49 ml   Weight change: 5.72 kg Exam:   General:  Pt is alert, follows commands appropriately, not in acute distress  HEENT: No icterus, No thrush, No neck mass, Ray/AT  Cardiovascular: RRR, S1/S2, no rubs, no gallops  Respiratory: bibasilar rales. No wheeze  Abdomen: Soft/+BS, non tender, non distended, no guarding  Extremities: No edema, No lymphangitis, No petechiae, No rashes, no  synovitis   Data Reviewed: I have personally reviewed following labs and imaging studies Basic Metabolic Panel: Recent Labs  Lab 07/31/20 1535 07/31/20 2123 08/01/20 0534 08/02/20 0653  NA 137  --  136 137  K 4.0  --  3.8 3.8  CL 106  --  106 107  CO2 22  --  24 24  GLUCOSE 120*  --  101* 97  BUN 25*  --  20 17  CREATININE 1.26*  --  1.11 0.92  CALCIUM 8.5*  --  8.1* 8.4*  MG  --   --  1.8  --   PHOS  --  3.0  --   --    Liver Function Tests: Recent Labs  Lab 07/31/20 1535 08/01/20 0534  AST 16 13*  ALT 12 10  ALKPHOS 66 56  BILITOT 0.7 1.1  PROT 6.8 5.9*  ALBUMIN 3.6 3.0*   No results for input(s): LIPASE, AMYLASE in the last 168 hours. No results for input(s): AMMONIA in the last 168 hours. Coagulation Profile: Recent Labs  Lab 08/01/20 0534  INR 1.3*   CBC: Recent Labs  Lab 07/31/20 1535 08/01/20 0534 08/02/20 0653  WBC 10.8* 7.9 8.6  NEUTROABS 8.9*  --   --   HGB 14.5 12.8* 13.2  HCT 44.2 38.7* 40.1  MCV 98.4 97.5 97.3  PLT 218 197 204   Cardiac Enzymes: No results for input(s): CKTOTAL, CKMB, CKMBINDEX, TROPONINI in the last 168 hours. BNP: Invalid input(s): POCBNP CBG: No results for input(s): GLUCAP in the last 168 hours. HbA1C: No results for input(s): HGBA1C in the last 72 hours. Urine analysis:    Component Value Date/Time   COLORURINE YELLOW 01/23/2007 1304   APPEARANCEUR CLEAR 01/23/2007 1304   LABSPEC 1.010 01/23/2007 1304   PHURINE 7.0 01/23/2007 1304   GLUCOSEU NEGATIVE 01/23/2007 1304   HGBUR NEGATIVE 01/23/2007 1304   BILIRUBINUR NEGATIVE 01/23/2007 1304   KETONESUR NEGATIVE 01/23/2007 1304   PROTEINUR NEGATIVE 01/23/2007 1304   UROBILINOGEN 0.2 01/23/2007 1304   NITRITE NEGATIVE 01/23/2007 1304   LEUKOCYTESUR  01/23/2007 1304    NEGATIVE MICROSCOPIC NOT DONE ON URINES WITH NEGATIVE PROTEIN, BLOOD, LEUKOCYTES, NITRITE, OR GLUCOSE <1000 mg/dL.   Sepsis Labs: (procalcitonin:4,lacticidven:4) ) Recent Results  (from the past 240 hour(s))  Resp Panel by RT-PCR (Flu A&B, Covid) Nasopharyngeal Swab     Status: None   Collection Time: 07/31/20  3:06 PM   Specimen: Nasopharyngeal Swab; Nasopharyngeal(NP) swabs in vial transport medium  Result Value Ref Range Status   SARS Coronavirus 2 by RT PCR NEGATIVE NEGATIVE Final    Comment: (NOTE) SARS-CoV-2 target nucleic acids are NOT DETECTED.  The SARS-CoV-2 RNA is generally detectable in upper respiratory specimens during the acute phase of infection. The lowest concentration of SARS-CoV-2 viral copies this assay can detect is 138 copies/mL. A negative result does not preclude SARS-Cov-2 infection and should not be used as the sole basis for treatment or other patient management decisions. A negative result may occur with  improper specimen collection/handling, submission of specimen other than nasopharyngeal swab,  presence of viral mutation(s) within the areas targeted by this assay, and inadequate number of viral copies(<138 copies/mL). A negative result must be combined with clinical observations, patient history, and epidemiological information. The expected result is Negative.  Fact Sheet for Patients:  BloggerCourse.com  Fact Sheet for Healthcare Providers:  SeriousBroker.it  This test is no t yet approved or cleared by the [place] FDA and  has been authorized for detection and/or diagnosis of SARS-CoV-2 by FDA under an Emergency Use Authorization (EUA). This EUA will remain  in effect (meaning this test can be used) for the duration of the COVID-19 declaration under Section 564(b)(1) of the Act, 21 U.S.C.section 360bbb-3(b)(1), unless the authorization is terminated  or revoked sooner.       Influenza A by PCR NEGATIVE NEGATIVE Final   Influenza B by PCR NEGATIVE NEGATIVE Final    Comment: (NOTE) The Xpert Xpress SARS-CoV-2/FLU/RSV plus assay is intended as an aid in the  diagnosis of influenza from Nasopharyngeal swab specimens and should not be used as a sole basis for treatment. Nasal washings and aspirates are unacceptable for Xpert Xpress SARS-CoV-2/FLU/RSV testing.  Fact Sheet for Patients: BloggerCourse.com  Fact Sheet for Healthcare Providers: SeriousBroker.it  This test is not yet approved or cleared by the [place] FDA and has been authorized for detection and/or diagnosis of SARS-CoV-2 by FDA under an Emergency Use Authorization (EUA). This EUA will remain in effect (meaning this test can be used) for the duration of the COVID-19 declaration under Section 564(b)(1) of the Act, 21 U.S.C. section 360bbb-3(b)(1), unless the authorization is terminated or revoked.  Performed at [facility], [address], [place], [place] [address]   Blood Culture (routine x 2)     Status: Abnormal   Collection Time: 07/31/20  3:35 PM   Specimen: BLOOD  Result Value Ref Range Status   Specimen Description   Final    BLOOD RIGHT HAND Performed at [facility], [address] [place], [place] [address]    Special Requests   Final    BOTTLES DRAWN AEROBIC AND ANAEROBIC Blood Culture adequate volume Performed at [facility], [address], [place], [place] [address]    Culture  Setup Time   Final    GRAM POSITIVE COCCI AEROBIC BOTTLE ONLY Gram Stain Report Called to,Read Back By and Verified With: BROWN @ 1228 ON [id number] BY HENDERSON L. CRITICAL RESULT CALLED TO, READ BACK BY AND VERIFIED WITH: Lennox Pippins RN 1943 08/01/20 A BROWNING    Culture (A)  Final    STAPHYLOCOCCUS CAPITIS THE SIGNIFICANCE OF ISOLATING THIS ORGANISM FROM A SINGLE SET OF BLOOD CULTURES WHEN MULTIPLE SETS ARE DRAWN IS UNCERTAIN. PLEASE NOTIFY THE MICROBIOLOGY DEPARTMENT WITHIN ONE WEEK IF SPECIATION AND SENSITIVITIES ARE REQUIRED. Performed at [facility], [address], [place], [place] [address]    Report Status 08/02/2020  FINAL  Final  Blood Culture (routine x 2)     Status: None (Preliminary result)   Collection Time: 07/31/20  3:35 PM   Specimen: BLOOD  Result Value Ref Range Status   Specimen Description BLOOD LEFT ARM  Final   Special Requests   Final    BOTTLES DRAWN AEROBIC AND ANAEROBIC Blood Culture adequate volume   Culture   Final    NO GROWTH 2 DAYS Performed at [facility], [address], [place], [place] [address]    Report Status PENDING  Incomplete  Blood Culture ID Panel (Reflexed)     Status: Abnormal  Collection Time: 07/31/20  3:35 PM  Result Value Ref Range Status   Enterococcus faecalis NOT DETECTED NOT DETECTED Final   Enterococcus Faecium NOT DETECTED NOT DETECTED Final   Listeria monocytogenes NOT DETECTED NOT DETECTED Final   Staphylococcus species DETECTED (A) NOT DETECTED Final    Comment: CRITICAL RESULT CALLED TO, READ BACK BY AND VERIFIED WITH: Lennox Pippins RN 1943 08/01/20 A BROWNING    Staphylococcus aureus (BCID) NOT DETECTED NOT DETECTED Final   Staphylococcus epidermidis NOT DETECTED NOT DETECTED Final   Staphylococcus lugdunensis NOT DETECTED NOT DETECTED Final   Streptococcus species NOT DETECTED NOT DETECTED Final   Streptococcus agalactiae NOT DETECTED NOT DETECTED Final   Streptococcus pneumoniae NOT DETECTED NOT DETECTED Final   Streptococcus pyogenes NOT DETECTED NOT DETECTED Final   A.calcoaceticus-baumannii NOT DETECTED NOT DETECTED Final   Bacteroides fragilis NOT DETECTED NOT DETECTED Final   Enterobacterales NOT DETECTED NOT DETECTED Final   Enterobacter cloacae complex NOT DETECTED NOT DETECTED Final   Escherichia coli NOT DETECTED NOT DETECTED Final   Klebsiella aerogenes NOT DETECTED NOT DETECTED Final   Klebsiella oxytoca NOT DETECTED NOT DETECTED Final   Klebsiella pneumoniae NOT DETECTED NOT DETECTED Final   Proteus species NOT DETECTED NOT DETECTED Final   Salmonella species NOT DETECTED NOT DETECTED Final   Serratia marcescens NOT DETECTED NOT  DETECTED Final   Haemophilus influenzae NOT DETECTED NOT DETECTED Final   Neisseria meningitidis NOT DETECTED NOT DETECTED Final   Pseudomonas aeruginosa NOT DETECTED NOT DETECTED Final   Stenotrophomonas maltophilia NOT DETECTED NOT DETECTED Final   Candida albicans NOT DETECTED NOT DETECTED Final   Candida auris NOT DETECTED NOT DETECTED Final   Candida glabrata NOT DETECTED NOT DETECTED Final   Candida krusei NOT DETECTED NOT DETECTED Final   Candida parapsilosis NOT DETECTED NOT DETECTED Final   Candida tropicalis NOT DETECTED NOT DETECTED Final   Cryptococcus neoformans/gattii NOT DETECTED NOT DETECTED Final    Comment: Performed at [facility], [address], [place], [place] [address]     Scheduled Meds: . memantine  10 mg Oral BID  . QUEtiapine  50 mg Oral BID  . simvastatin  20 mg Oral QHS   Continuous Infusions: . heparin 1,250 Units/hr (08/02/20 0943)    Procedures/Studies: CT Angio Chest PE W/Cm &/Or Wo Cm  Result Date: 07/31/2020 CLINICAL DATA:  Short of breath, positive D-dimer, hypoxia EXAM: CT ANGIOGRAPHY CHEST WITH CONTRAST TECHNIQUE: Multidetector CT imaging of the chest was performed using the standard protocol during bolus administration of intravenous contrast. Multiplanar CT image reconstructions and MIPs were obtained to evaluate the vascular anatomy. CONTRAST:  68mL OMNIPAQUE IOHEXOL 350 MG/ML SOLN COMPARISON:  07/31/2020 FINDINGS: Cardiovascular: This is a technically adequate evaluation of the pulmonary vasculature. There are bilateral pulmonary emboli seen within the main pulmonary arteries and extending into the bilateral segmental branches. Overall, clot burden is moderate, though there is no evidence of right ventricular dilatation or right heart strain. The heart is unremarkable without pericardial effusion. Moderate atherosclerosis throughout the coronary vasculature. No evidence of thoracic aortic aneurysm or dissection. Mild atherosclerosis of the  aortic arch. Mediastinum/Nodes: No enlarged mediastinal, hilar, or axillary lymph nodes. Thyroid gland, trachea, and esophagus demonstrate no significant findings. Lungs/Pleura: There are trace bilateral pleural effusions. Mild background emphysema and scarring without airspace disease or pneumothorax. The central airways are patent. Upper Abdomen: No acute abnormality. Musculoskeletal: No acute or destructive bony lesions. Reconstructed images demonstrate no additional findings. Review of the MIP  images confirms the above findings. IMPRESSION: 1. Bilateral pulmonary emboli, with moderate clot burden. No evidence of right heart strain. 2. Trace bilateral pleural effusions. 3. Aortic Atherosclerosis (ICD10-I70.0) and Emphysema (ICD10-J43.9). Critical Value/emergent results were called by telephone at the time of interpretation on 07/31/2020 at 6:42 pm to provider [facility] , who verbally acknowledged these results. Electronically Signed   By: Sharlet Salina M.D.   On: 07/31/2020 18:59   DG Chest Port 1 View  Result Date: 07/31/2020 CLINICAL DATA:  Cough and shortness of breath with fevers EXAM: PORTABLE CHEST 1 VIEW COMPARISON:  None. FINDINGS: Cardiac shadow is within normal limits. Aortic calcifications are seen. Lungs are well aerated bilaterally. No focal infiltrate or sizable effusion is seen. No bony abnormality is noted. IMPRESSION: No acute abnormality seen. Electronically Signed   By: Alcide Clever M.D.   On: 07/31/2020 15:50   ECHOCARDIOGRAM COMPLETE  Result Date: 08/01/2020    ECHOCARDIOGRAM REPORT   Patient Name:   HUNG RHINESMITH Date of Exam: 08/01/2020 Medical Rec #:  [id number]       Height:       69.0 in Accession #:    [id number]      Weight:       175.0 lb Date of Birth:  [birth year]      BSA:          1.952 m Patient Age:    89 years        BP:           103/67 mmHg Patient Gender: M               HR:           70 bpm. [facility] Procedure: 2D Echo, Cardiac Doppler and Color  Doppler Indications:    Pulmonary embolus  History:        Patient has no prior history of Echocardiogram examinations.                 Signs/Symptoms:Shortness of Breath; Risk Factors:Hypertension                 and Dyslipidemia. Dementia, Pleural effusion.  Sonographer:    Lavenia Atlas RDCS Referring Phys: [id number] OLADAPO ADEFESO IMPRESSIONS  1. Left ventricular ejection fraction, by estimation, is 60 to 65%. The left ventricle has normal function. The left ventricle has no regional wall motion abnormalities. There is mild left ventricular hypertrophy. Left ventricular diastolic parameters are consistent with Grade I diastolic dysfunction (impaired relaxation).  2. RV not well visualized, grossly appears normal in size and function. . Right ventricular systolic function was not well visualized. The right ventricular size is not well visualized.  3. The mitral valve was not well visualized. No evidence of mitral valve regurgitation. No evidence of mitral stenosis.  4. The tricuspid valve is abnormal. Tricuspid valve regurgitation is moderate.  5. The aortic valve was not well visualized. Aortic valve regurgitation is not visualized. No aortic stenosis is present.  6. Moderate pulmonary HTN, PASP is 59 mmHg. FINDINGS  Left Ventricle: Left ventricular ejection fraction, by estimation, is 60 to 65%. The left ventricle has normal function. The left ventricle has no regional wall motion abnormalities. The left ventricular internal cavity size was normal in size. There is  mild left ventricular hypertrophy. Left ventricular diastolic parameters are consistent with Grade I diastolic dysfunction (impaired relaxation). Normal left ventricular filling pressure. Right Ventricle: RV not well visualized, grossly appears normal in size  and function. The right ventricular size is not well visualized. Right vetricular wall thickness was not well visualized. Right ventricular systolic function was not well visualized. Left  Atrium: Left atrial size was not well visualized. Right Atrium: Right atrial size was not well visualized. Pericardium: There is no evidence of pericardial effusion. Mitral Valve: The mitral valve was not well visualized. There is mild thickening of the mitral valve leaflet(s). There is mild calcification of the mitral valve leaflet(s). Mild mitral annular calcification. No evidence of mitral valve regurgitation. No evidence of mitral valve stenosis. Tricuspid Valve: The tricuspid valve is abnormal. Tricuspid valve regurgitation is moderate . No evidence of tricuspid stenosis. Aortic Valve: The aortic valve was not well visualized. Aortic valve regurgitation is not visualized. No aortic stenosis is present. Aortic valve mean gradient measures 3.7 mmHg. Aortic valve peak gradient measures 7.3 mmHg. Aortic valve area, by VTI measures 4.15 cm. Pulmonic Valve: The pulmonic valve was not well visualized. Pulmonic valve regurgitation is trivial. No evidence of pulmonic stenosis. Aorta: The aortic root is normal in size and structure. Pulmonary Artery: Moderate pulmonary HTN, PASP is 59 mmHg. IAS/Shunts: No atrial level shunt detected by color flow Doppler.  LEFT VENTRICLE PLAX 2D LVIDd:         4.96 cm  Diastology LVIDs:         2.75 cm  LV e' medial:    6.92 cm/s LV PW:         1.28 cm  LV E/e' medial:  9.2 LV IVS:        1.20 cm  LV e' lateral:   7.71 cm/s LVOT diam:     2.30 cm  LV E/e' lateral: 8.3 LV SV:         102 LV SV Index:   52 LVOT Area:     4.15 cm  RIGHT VENTRICLE RV Basal diam:  3.17 cm RV S prime:     12.10 cm/s TAPSE (M-mode): 3.2 cm LEFT ATRIUM             Index       RIGHT ATRIUM           Index LA diam:        3.00 cm 1.54 cm/m  RA Area:     17.00 cm LA Vol (A2C):   39.5 ml 20.24 ml/m RA Volume:   43.10 ml  22.08 ml/m LA Vol (A4C):   52.9 ml 27.10 ml/m LA Biplane Vol: 48.0 ml 24.59 ml/m  AORTIC VALVE AV Area (Vmax):    3.92 cm AV Area (Vmean):   3.88 cm AV Area (VTI):     4.15 cm AV Vmax:            134.77 cm/s AV Vmean:          89.408 cm/s AV VTI:            0.246 m AV Peak Grad:      7.3 mmHg AV Mean Grad:      3.7 mmHg LVOT Vmax:         127.00 cm/s LVOT Vmean:        83.500 cm/s LVOT VTI:          0.245 m LVOT/AV VTI ratio: 1.00  AORTA Ao Root diam: 3.70 cm MITRAL VALVE MV Area (PHT): 3.50 cm     SHUNTS MV Decel Time: 217 msec     Systemic VTI:  0.24 m MV E velocity: 63.90 cm/s   Systemic  Diam: 2.30 cm MV A velocity: 109.00 cm/s MV E/A ratio:  0.59 Dina RichJonathan Branch MD Electronically signed by Dina RichJonathan Branch MD Signature Date/Time: 08/01/2020/1:30:00 PM    Final     Catarina Hartshornavid Sabria Florido, DO  [facility]  If 7PM-7AM, please contact night-coverage [email or web address] Password [email or web address] 08/02/2020, 4:09 PM   LOS: 1 day

## 2020-08-03 LAB — CBC
HCT: 39.6 % (ref 39.0–52.0)
Hemoglobin: 12.9 g/dL — ABNORMAL LOW (ref 13.0–17.0)
MCH: 31.9 pg (ref 26.0–34.0)
MCHC: 32.6 g/dL (ref 30.0–36.0)
MCV: 98 fL (ref 80.0–100.0)
Platelets: 223 10*3/uL (ref 150–400)
RBC: 4.04 MIL/uL — ABNORMAL LOW (ref 4.22–5.81)
RDW: 13 % (ref 11.5–15.5)
WBC: 7.8 10*3/uL (ref 4.0–10.5)
nRBC: 0 % (ref 0.0–0.2)

## 2020-08-03 LAB — HEPARIN LEVEL (UNFRACTIONATED): Heparin Unfractionated: 0.41 IU/mL (ref 0.30–0.70)

## 2020-08-03 MED ORDER — APIXABAN 5 MG PO TABS: 5.0000 mg | ORAL_TABLET | Freq: Two times a day (BID) | ORAL | Status: DC

## 2020-08-03 MED ORDER — ONDANSETRON HCL 4 MG/2ML IJ SOLN: 4.0000 mg | Freq: Four times a day (QID) | INTRAMUSCULAR | Status: DC | PRN

## 2020-08-03 MED ORDER — ACETAMINOPHEN 325 MG PO TABS: 650.0000 mg | ORAL_TABLET | Freq: Four times a day (QID) | ORAL | Status: DC | PRN

## 2020-08-03 MED ORDER — APIXABAN 5 MG PO TABS: 10.0000 mg | ORAL_TABLET | Freq: Two times a day (BID) | ORAL | 2 refills | Status: AC

## 2020-08-03 MED ORDER — APIXABAN 5 MG PO TABS
10.0000 mg | ORAL_TABLET | Freq: Two times a day (BID) | ORAL | Status: DC
  Administered 2020-08-03: 10 mg via ORAL
  Filled 2020-08-03: qty 2

## 2020-08-03 NOTE — Discharge Summary (Signed)
Physician Discharge Summary  Christopher Warner ZOX:[id number] DOB: 1927/05/18 DOA: 07/31/2020  PCP: Benita Stabile, MD  Admit date: 07/31/2020 Discharge date: 08/03/2020  Admitted From: Home Disposition:  Home / SNF  Recommendations for Outpatient Follow-up:  1. Follow up with PCP in 1-2 weeks 2. Please obtain BMP/CBC in one week    Discharge Condition: Stable CODE STATUS: DNR Diet recommendation: Heart Healthy    Brief/Interim Summary: 87 year old male with a history of hyperlipidemia, anxiety, dementia, GERD presenting with shortness of breath on 5/22. The patient had been in his usual state of health without any complaints. His daily caretaker noted him to be more short of breath after coming back to the bathroom on the morning of 07/31/2020. As result, EMS was activated. The patient is unable to provide any significant history due to his dementia. Nevertheless, history supplemented by the patient's son and daughter does not reveal any new medications, recent surgeries, or recent falls. At baseline, the patient requires assistance with feeding and transfers. He is able to ambulate with a cane. He is pleasantly confused. There is been no documented fevers, chills, headache, chest pain, nausea, vomiting or diarrhea, abdominal pain. In the emergency department, the patient had a low-grade temperature of 100.1 F with oxygen saturation 90% on room air. He was hemodynamically stable. BMP was unremarkable with serum creatinine of 1.26 which is at his baseline. LFTs were unremarkable. WBC 10.8, hemoglobin 14.5, platelets 218,000. CTA chest showed bilateral pulmonary emboli in the main pulmonary artery. There is no RV strain. There are trace bilateral pleural effusions.  Discharge Diagnoses:  Acute respiratory failure with hypoxia -Secondary to pulmonary embolus -Stable on 2 L nasal cannula>>94% -Weaned to RA and remained clinically stable  Acute pulmonary embolus -Unprovoked by  clinical history -Continue IV heparin>>po apixaban -Echo EF 60-65%; grossly normal RV, G1DD  Essential hypertension -Holding amlodipine and losartan>>will not restart at time of d/c -Blood pressure remains well controlled -f/u PCP for BP check  Dementia without behavioral disturbance -Continue Seroquel and Namenda  Mixed hyperlipidemia -Continue statin  CKD stage IIIa -Baseline creatinine 0.9-1.2 -serum creatinine 0.92 on day of d/c  Elevated troponin -Secondary to pulmonary embolus -Troponin 54>>55 -No chest pain presently  Pleural Effusion -reactive from PE -stable on 2L>>>RA  Goals of Care -discussed with son and daughter at bedside -confirmed DNR  Discharge Instructions   Allergies as of 08/03/2020      Reactions   Sulfa Antibiotics Nausea And Vomiting      Medication List    STOP taking these medications   amLODipine 10 MG tablet Commonly known as: NORVASC   amLODipine 5 MG tablet Commonly known as: NORVASC   aspirin 325 MG EC tablet   BLACK CHERRY CONCENTRATE PO   Calcium Carb-Cholecalciferol 600-200 MG-UNIT Tabs   cyclobenzaprine 10 MG tablet Commonly known as: FLEXERIL   Digestive Advantage Caps   fish oil-omega-3 fatty acids 1000 MG capsule   ibuprofen 800 MG tablet Commonly known as: ADVIL   LORazepam 0.5 MG tablet Commonly known as: ATIVAN   losartan 100 MG tablet Commonly known as: COZAAR   losartan 50 MG tablet Commonly known as: COZAAR   multivitamin with minerals tablet   pantoprazole 40 MG tablet Commonly known as: PROTONIX   polycarbophil 625 MG tablet Commonly known as: FIBERCON   PRESERVISION AREDS 2 PO   psyllium 58.6 % powder Commonly known as: METAMUCIL     TAKE these medications   apixaban 5 MG Tabs tablet Commonly known as:  ELIQUIS Take 2 tablets (10 mg total) by mouth 2 (two) times daily. X 7 days. On 08/10/20 take 1 tab (5 mg) two times daily   memantine 10 MG tablet Commonly known as:  NAMENDA Take 10 mg by mouth 2 (two) times daily.   QUEtiapine 50 MG tablet Commonly known as: SEROQUEL Take 50 mg by mouth 2 (two) times daily.   simvastatin 40 MG tablet Commonly known as: ZOCOR Take 20 mg by mouth at bedtime. Reported on 06/09/2015       Allergies  Allergen Reactions  . Sulfa Antibiotics Nausea And Vomiting    Consultations:  none   Procedures/Studies: CT Angio Chest PE W/Cm &/Or Wo Cm  Result Date: 07/31/2020 CLINICAL DATA:  Short of breath, positive D-dimer, hypoxia EXAM: CT ANGIOGRAPHY CHEST WITH CONTRAST TECHNIQUE: Multidetector CT imaging of the chest was performed using the standard protocol during bolus administration of intravenous contrast. Multiplanar CT image reconstructions and MIPs were obtained to evaluate the vascular anatomy. CONTRAST:  75mL OMNIPAQUE IOHEXOL 350 MG/ML SOLN COMPARISON:  07/31/2020 FINDINGS: Cardiovascular: This is a technically adequate evaluation of the pulmonary vasculature. There are bilateral pulmonary emboli seen within the main pulmonary arteries and extending into the bilateral segmental branches. Overall, clot burden is moderate, though there is no evidence of right ventricular dilatation or right heart strain. The heart is unremarkable without pericardial effusion. Moderate atherosclerosis throughout the coronary vasculature. No evidence of thoracic aortic aneurysm or dissection. Mild atherosclerosis of the aortic arch. Mediastinum/Nodes: No enlarged mediastinal, hilar, or axillary lymph nodes. Thyroid gland, trachea, and esophagus demonstrate no significant findings. Lungs/Pleura: There are trace bilateral pleural effusions. Mild background emphysema and scarring without airspace disease or pneumothorax. The central airways are patent. Upper Abdomen: No acute abnormality. Musculoskeletal: No acute or destructive bony lesions. Reconstructed images demonstrate no additional findings. Review of the MIP images confirms the above  findings. IMPRESSION: 1. Bilateral pulmonary emboli, with moderate clot burden. No evidence of right heart strain. 2. Trace bilateral pleural effusions. 3. Aortic Atherosclerosis (ICD10-I70.0) and Emphysema (ICD10-J43.9). Critical Value/emergent results were called by telephone at the time of interpretation on 07/31/2020 at 6:42 pm to provider [facility] , who verbally acknowledged these results. Electronically Signed   By: Sharlet Salina M.D.   On: 07/31/2020 18:59   DG Chest Port 1 View  Result Date: 07/31/2020 CLINICAL DATA:  Cough and shortness of breath with fevers EXAM: PORTABLE CHEST 1 VIEW COMPARISON:  None. FINDINGS: Cardiac shadow is within normal limits. Aortic calcifications are seen. Lungs are well aerated bilaterally. No focal infiltrate or sizable effusion is seen. No bony abnormality is noted. IMPRESSION: No acute abnormality seen. Electronically Signed   By: Alcide Clever M.D.   On: 07/31/2020 15:50   ECHOCARDIOGRAM COMPLETE  Result Date: 08/01/2020    ECHOCARDIOGRAM REPORT   Patient Name:   Christopher Warner Date of Exam: 08/01/2020 Medical Rec #:  [id number]       Height:       69.0 in Accession #:    [id number]      Weight:       175.0 lb Date of Birth:  [birth year]      BSA:          1.952 m Patient Age:    89 years        BP:           103/67 mmHg Patient Gender: M  HR:           70 bpm. [facility] Procedure: 2D Echo, Cardiac Doppler and Color Doppler Indications:    Pulmonary embolus  History:        Patient has no prior history of Echocardiogram examinations.                 Signs/Symptoms:Shortness of Breath; Risk Factors:Hypertension                 and Dyslipidemia. Dementia, Pleural effusion.  Sonographer:    Lavenia Atlas RDCS Referring Phys: [id number] OLADAPO ADEFESO IMPRESSIONS  1. Left ventricular ejection fraction, by estimation, is 60 to 65%. The left ventricle has normal function. The left ventricle has no regional wall motion abnormalities. There  is mild left ventricular hypertrophy. Left ventricular diastolic parameters are consistent with Grade I diastolic dysfunction (impaired relaxation).  2. RV not well visualized, grossly appears normal in size and function. . Right ventricular systolic function was not well visualized. The right ventricular size is not well visualized.  3. The mitral valve was not well visualized. No evidence of mitral valve regurgitation. No evidence of mitral stenosis.  4. The tricuspid valve is abnormal. Tricuspid valve regurgitation is moderate.  5. The aortic valve was not well visualized. Aortic valve regurgitation is not visualized. No aortic stenosis is present.  6. Moderate pulmonary HTN, PASP is 59 mmHg. FINDINGS  Left Ventricle: Left ventricular ejection fraction, by estimation, is 60 to 65%. The left ventricle has normal function. The left ventricle has no regional wall motion abnormalities. The left ventricular internal cavity size was normal in size. There is  mild left ventricular hypertrophy. Left ventricular diastolic parameters are consistent with Grade I diastolic dysfunction (impaired relaxation). Normal left ventricular filling pressure. Right Ventricle: RV not well visualized, grossly appears normal in size and function. The right ventricular size is not well visualized. Right vetricular wall thickness was not well visualized. Right ventricular systolic function was not well visualized. Left Atrium: Left atrial size was not well visualized. Right Atrium: Right atrial size was not well visualized. Pericardium: There is no evidence of pericardial effusion. Mitral Valve: The mitral valve was not well visualized. There is mild thickening of the mitral valve leaflet(s). There is mild calcification of the mitral valve leaflet(s). Mild mitral annular calcification. No evidence of mitral valve regurgitation. No evidence of mitral valve stenosis. Tricuspid Valve: The tricuspid valve is abnormal. Tricuspid valve  regurgitation is moderate . No evidence of tricuspid stenosis. Aortic Valve: The aortic valve was not well visualized. Aortic valve regurgitation is not visualized. No aortic stenosis is present. Aortic valve mean gradient measures 3.7 mmHg. Aortic valve peak gradient measures 7.3 mmHg. Aortic valve area, by VTI measures 4.15 cm. Pulmonic Valve: The pulmonic valve was not well visualized. Pulmonic valve regurgitation is trivial. No evidence of pulmonic stenosis. Aorta: The aortic root is normal in size and structure. Pulmonary Artery: Moderate pulmonary HTN, PASP is 59 mmHg. IAS/Shunts: No atrial level shunt detected by color flow Doppler.  LEFT VENTRICLE PLAX 2D LVIDd:         4.96 cm  Diastology LVIDs:         2.75 cm  LV e' medial:    6.92 cm/s LV PW:         1.28 cm  LV E/e' medial:  9.2 LV IVS:        1.20 cm  LV e' lateral:   7.71 cm/s LVOT diam:  2.30 cm  LV E/e' lateral: 8.3 LV SV:         102 LV SV Index:   52 LVOT Area:     4.15 cm  RIGHT VENTRICLE RV Basal diam:  3.17 cm RV S prime:     12.10 cm/s TAPSE (M-mode): 3.2 cm LEFT ATRIUM             Index       RIGHT ATRIUM           Index LA diam:        3.00 cm 1.54 cm/m  RA Area:     17.00 cm LA Vol (A2C):   39.5 ml 20.24 ml/m RA Volume:   43.10 ml  22.08 ml/m LA Vol (A4C):   52.9 ml 27.10 ml/m LA Biplane Vol: 48.0 ml 24.59 ml/m  AORTIC VALVE AV Area (Vmax):    3.92 cm AV Area (Vmean):   3.88 cm AV Area (VTI):     4.15 cm AV Vmax:           134.77 cm/s AV Vmean:          89.408 cm/s AV VTI:            0.246 m AV Peak Grad:      7.3 mmHg AV Mean Grad:      3.7 mmHg LVOT Vmax:         127.00 cm/s LVOT Vmean:        83.500 cm/s LVOT VTI:          0.245 m LVOT/AV VTI ratio: 1.00  AORTA Ao Root diam: 3.70 cm MITRAL VALVE MV Area (PHT): 3.50 cm     SHUNTS MV Decel Time: 217 msec     Systemic VTI:  0.24 m MV E velocity: 63.90 cm/s   Systemic Diam: 2.30 cm MV A velocity: 109.00 cm/s MV E/A ratio:  0.59 Dina RichJonathan Branch MD Electronically signed by  Dina RichJonathan Branch MD Signature Date/Time: 08/01/2020/1:30:00 PM    Final         Discharge Exam: Vitals:   08/02/20 2100 08/03/20 0535  BP: 115/66 134/80  Pulse: 69 62  Resp: 16 18  Temp: 98.6 F (37 C) 98 F (36.7 C)  SpO2:  95%   Vitals:   08/02/20 1350 08/02/20 2100 08/03/20 0409 08/03/20 0535  BP: (!) 148/93 115/66  134/80  Pulse: (!) 55 69  62  Resp: 16 16  18   Temp:  98.6 F (37 C)  98 F (36.7 C)  TempSrc:  Oral    SpO2: 94%   95%  Weight:   84.9 kg   Height:        General: Pt is alert, awake, not in acute distress Cardiovascular: RRR, S1/S2 +, no rubs, no gallops Respiratory: bibasilar rales Abdominal: Soft, NT, ND, bowel sounds + Extremities: no edema, no cyanosis   The results of significant diagnostics from this hospitalization (including imaging, microbiology, ancillary and laboratory) are listed below for reference.    Significant Diagnostic Studies: CT Angio Chest PE W/Cm &/Or Wo Cm  Result Date: 07/31/2020 CLINICAL DATA:  Short of breath, positive D-dimer, hypoxia EXAM: CT ANGIOGRAPHY CHEST WITH CONTRAST TECHNIQUE: Multidetector CT imaging of the chest was performed using the standard protocol during bolus administration of intravenous contrast. Multiplanar CT image reconstructions and MIPs were obtained to evaluate the vascular anatomy. CONTRAST:  75mL OMNIPAQUE IOHEXOL 350 MG/ML SOLN COMPARISON:  07/31/2020 FINDINGS: Cardiovascular: This is a technically adequate evaluation of the  pulmonary vasculature. There are bilateral pulmonary emboli seen within the main pulmonary arteries and extending into the bilateral segmental branches. Overall, clot burden is moderate, though there is no evidence of right ventricular dilatation or right heart strain. The heart is unremarkable without pericardial effusion. Moderate atherosclerosis throughout the coronary vasculature. No evidence of thoracic aortic aneurysm or dissection. Mild atherosclerosis of the aortic arch.  Mediastinum/Nodes: No enlarged mediastinal, hilar, or axillary lymph nodes. Thyroid gland, trachea, and esophagus demonstrate no significant findings. Lungs/Pleura: There are trace bilateral pleural effusions. Mild background emphysema and scarring without airspace disease or pneumothorax. The central airways are patent. Upper Abdomen: No acute abnormality. Musculoskeletal: No acute or destructive bony lesions. Reconstructed images demonstrate no additional findings. Review of the MIP images confirms the above findings. IMPRESSION: 1. Bilateral pulmonary emboli, with moderate clot burden. No evidence of right heart strain. 2. Trace bilateral pleural effusions. 3. Aortic Atherosclerosis (ICD10-I70.0) and Emphysema (ICD10-J43.9). Critical Value/emergent results were called by telephone at the time of interpretation on 07/31/2020 at 6:42 pm to provider [facility] , who verbally acknowledged these results. Electronically Signed   By: Sharlet Salina M.D.   On: 07/31/2020 18:59   DG Chest Port 1 View  Result Date: 07/31/2020 CLINICAL DATA:  Cough and shortness of breath with fevers EXAM: PORTABLE CHEST 1 VIEW COMPARISON:  None. FINDINGS: Cardiac shadow is within normal limits. Aortic calcifications are seen. Lungs are well aerated bilaterally. No focal infiltrate or sizable effusion is seen. No bony abnormality is noted. IMPRESSION: No acute abnormality seen. Electronically Signed   By: Alcide Clever M.D.   On: 07/31/2020 15:50   ECHOCARDIOGRAM COMPLETE  Result Date: 08/01/2020    ECHOCARDIOGRAM REPORT   Patient Name:   Christopher Warner Date of Exam: 08/01/2020 Medical Rec #:  [id number]       Height:       69.0 in Accession #:    [id number]      Weight:       175.0 lb Date of Birth:  06-24-1927      BSA:          1.952 m Patient Age:    89 years        BP:           103/67 mmHg Patient Gender: M               HR:           70 bpm. [facility] Procedure: 2D Echo, Cardiac Doppler and Color Doppler  Indications:    Pulmonary embolus  History:        Patient has no prior history of Echocardiogram examinations.                 Signs/Symptoms:Shortness of Breath; Risk Factors:Hypertension                 and Dyslipidemia. Dementia, Pleural effusion.  Sonographer:    Lavenia Atlas RDCS Referring Phys: [id number] OLADAPO ADEFESO IMPRESSIONS  1. Left ventricular ejection fraction, by estimation, is 60 to 65%. The left ventricle has normal function. The left ventricle has no regional wall motion abnormalities. There is mild left ventricular hypertrophy. Left ventricular diastolic parameters are consistent with Grade I diastolic dysfunction (impaired relaxation).  2. RV not well visualized, grossly appears normal in size and function. . Right ventricular systolic function was not well visualized. The right ventricular size is not well visualized.  3. The mitral valve was not well  visualized. No evidence of mitral valve regurgitation. No evidence of mitral stenosis.  4. The tricuspid valve is abnormal. Tricuspid valve regurgitation is moderate.  5. The aortic valve was not well visualized. Aortic valve regurgitation is not visualized. No aortic stenosis is present.  6. Moderate pulmonary HTN, PASP is 59 mmHg. FINDINGS  Left Ventricle: Left ventricular ejection fraction, by estimation, is 60 to 65%. The left ventricle has normal function. The left ventricle has no regional wall motion abnormalities. The left ventricular internal cavity size was normal in size. There is  mild left ventricular hypertrophy. Left ventricular diastolic parameters are consistent with Grade I diastolic dysfunction (impaired relaxation). Normal left ventricular filling pressure. Right Ventricle: RV not well visualized, grossly appears normal in size and function. The right ventricular size is not well visualized. Right vetricular wall thickness was not well visualized. Right ventricular systolic function was not well visualized. Left Atrium:  Left atrial size was not well visualized. Right Atrium: Right atrial size was not well visualized. Pericardium: There is no evidence of pericardial effusion. Mitral Valve: The mitral valve was not well visualized. There is mild thickening of the mitral valve leaflet(s). There is mild calcification of the mitral valve leaflet(s). Mild mitral annular calcification. No evidence of mitral valve regurgitation. No evidence of mitral valve stenosis. Tricuspid Valve: The tricuspid valve is abnormal. Tricuspid valve regurgitation is moderate . No evidence of tricuspid stenosis. Aortic Valve: The aortic valve was not well visualized. Aortic valve regurgitation is not visualized. No aortic stenosis is present. Aortic valve mean gradient measures 3.7 mmHg. Aortic valve peak gradient measures 7.3 mmHg. Aortic valve area, by VTI measures 4.15 cm. Pulmonic Valve: The pulmonic valve was not well visualized. Pulmonic valve regurgitation is trivial. No evidence of pulmonic stenosis. Aorta: The aortic root is normal in size and structure. Pulmonary Artery: Moderate pulmonary HTN, PASP is 59 mmHg. IAS/Shunts: No atrial level shunt detected by color flow Doppler.  LEFT VENTRICLE PLAX 2D LVIDd:         4.96 cm  Diastology LVIDs:         2.75 cm  LV e' medial:    6.92 cm/s LV PW:         1.28 cm  LV E/e' medial:  9.2 LV IVS:        1.20 cm  LV e' lateral:   7.71 cm/s LVOT diam:     2.30 cm  LV E/e' lateral: 8.3 LV SV:         102 LV SV Index:   52 LVOT Area:     4.15 cm  RIGHT VENTRICLE RV Basal diam:  3.17 cm RV S prime:     12.10 cm/s TAPSE (M-mode): 3.2 cm LEFT ATRIUM             Index       RIGHT ATRIUM           Index LA diam:        3.00 cm 1.54 cm/m  RA Area:     17.00 cm LA Vol (A2C):   39.5 ml 20.24 ml/m RA Volume:   43.10 ml  22.08 ml/m LA Vol (A4C):   52.9 ml 27.10 ml/m LA Biplane Vol: 48.0 ml 24.59 ml/m  AORTIC VALVE AV Area (Vmax):    3.92 cm AV Area (Vmean):   3.88 cm AV Area (VTI):     4.15 cm AV Vmax:            134.77 cm/s AV  Vmean:          89.408 cm/s AV VTI:            0.246 m AV Peak Grad:      7.3 mmHg AV Mean Grad:      3.7 mmHg LVOT Vmax:         127.00 cm/s LVOT Vmean:        83.500 cm/s LVOT VTI:          0.245 m LVOT/AV VTI ratio: 1.00  AORTA Ao Root diam: 3.70 cm MITRAL VALVE MV Area (PHT): 3.50 cm     SHUNTS MV Decel Time: 217 msec     Systemic VTI:  0.24 m MV E velocity: 63.90 cm/s   Systemic Diam: 2.30 cm MV A velocity: 109.00 cm/s MV E/A ratio:  0.59 Dina Rich MD Electronically signed by Dina Rich MD Signature Date/Time: 08/01/2020/1:30:00 PM    Final      Microbiology: Recent Results (from the past 240 hour(s))  Resp Panel by RT-PCR (Flu A&B, Covid) Nasopharyngeal Swab     Status: None   Collection Time: 07/31/20  3:06 PM   Specimen: Nasopharyngeal Swab; Nasopharyngeal(NP) swabs in vial transport medium  Result Value Ref Range Status   SARS Coronavirus 2 by RT PCR NEGATIVE NEGATIVE Final    Comment: (NOTE) SARS-CoV-2 target nucleic acids are NOT DETECTED.  The SARS-CoV-2 RNA is generally detectable in upper respiratory specimens during the acute phase of infection. The lowest concentration of SARS-CoV-2 viral copies this assay can detect is 138 copies/mL. A negative result does not preclude SARS-Cov-2 infection and should not be used as the sole basis for treatment or other patient management decisions. A negative result may occur with  improper specimen collection/handling, submission of specimen other than nasopharyngeal swab, presence of viral mutation(s) within the areas targeted by this assay, and inadequate number of viral copies(<138 copies/mL). A negative result must be combined with clinical observations, patient history, and epidemiological information. The expected result is Negative.  Fact Sheet for Patients:  BloggerCourse.com  Fact Sheet for Healthcare Providers:  SeriousBroker.it  This test is no t  yet approved or cleared by the [place] FDA and  has been authorized for detection and/or diagnosis of SARS-CoV-2 by FDA under an Emergency Use Authorization (EUA). This EUA will remain  in effect (meaning this test can be used) for the duration of the COVID-19 declaration under Section 564(b)(1) of the Act, 21 U.S.C.section 360bbb-3(b)(1), unless the authorization is terminated  or revoked sooner.       Influenza A by PCR NEGATIVE NEGATIVE Final   Influenza B by PCR NEGATIVE NEGATIVE Final    Comment: (NOTE) The Xpert Xpress SARS-CoV-2/FLU/RSV plus assay is intended as an aid in the diagnosis of influenza from Nasopharyngeal swab specimens and should not be used as a sole basis for treatment. Nasal washings and aspirates are unacceptable for Xpert Xpress SARS-CoV-2/FLU/RSV testing.  Fact Sheet for Patients: BloggerCourse.com  Fact Sheet for Healthcare Providers: SeriousBroker.it  This test is not yet approved or cleared by the [place] FDA and has been authorized for detection and/or diagnosis of SARS-CoV-2 by FDA under an Emergency Use Authorization (EUA). This EUA will remain in effect (meaning this test can be used) for the duration of the COVID-19 declaration under Section 564(b)(1) of the Act, 21 U.S.C. section 360bbb-3(b)(1), unless the authorization is terminated or revoked.  Performed at [facility], [address] [place], [place] [address]   Blood Culture (routine x  2)     Status: Abnormal   Collection Time: 07/31/20  3:35 PM   Specimen: BLOOD  Result Value Ref Range Status   Specimen Description   Final    BLOOD RIGHT HAND Performed at [facility], [address] [place], [place] [address]    Special Requests   Final    BOTTLES DRAWN AEROBIC AND ANAEROBIC Blood Culture adequate volume Performed at [facility], [address], [place], [place] [address]    Culture  Setup Time   Final    GRAM  POSITIVE COCCI AEROBIC BOTTLE ONLY Gram Stain Report Called to,Read Back By and Verified With: BROWN @ 1228 ON [id number] BY HENDERSON L. CRITICAL RESULT CALLED TO, READ BACK BY AND VERIFIED WITH: Lennox Pippins RN 1943 08/01/20 A BROWNING    Culture (A)  Final    STAPHYLOCOCCUS CAPITIS THE SIGNIFICANCE OF ISOLATING THIS ORGANISM FROM A SINGLE SET OF BLOOD CULTURES WHEN MULTIPLE SETS ARE DRAWN IS UNCERTAIN. PLEASE NOTIFY THE MICROBIOLOGY DEPARTMENT WITHIN ONE WEEK IF SPECIATION AND SENSITIVITIES ARE REQUIRED. Performed at [facility], [address], [place], [place] [address]    Report Status 08/02/2020 FINAL  Final  Blood Culture (routine x 2)     Status: None (Preliminary result)   Collection Time: 07/31/20  3:35 PM   Specimen: BLOOD  Result Value Ref Range Status   Specimen Description BLOOD LEFT ARM  Final   Special Requests   Final    BOTTLES DRAWN AEROBIC AND ANAEROBIC Blood Culture adequate volume   Culture   Final    NO GROWTH 3 DAYS Performed at [facility], [address] [place], [place] [address]    Report Status PENDING  Incomplete  Blood Culture ID Panel (Reflexed)     Status: Abnormal   Collection Time: 07/31/20  3:35 PM  Result Value Ref Range Status   Enterococcus faecalis NOT DETECTED NOT DETECTED Final   Enterococcus Faecium NOT DETECTED NOT DETECTED Final   Listeria monocytogenes NOT DETECTED NOT DETECTED Final   Staphylococcus species DETECTED (A) NOT DETECTED Final    Comment: CRITICAL RESULT CALLED TO, READ BACK BY AND VERIFIED WITH: Lennox Pippins RN 1943 08/01/20 A BROWNING    Staphylococcus aureus (BCID) NOT DETECTED NOT DETECTED Final   Staphylococcus epidermidis NOT DETECTED NOT DETECTED Final   Staphylococcus lugdunensis NOT DETECTED NOT DETECTED Final   Streptococcus species NOT DETECTED NOT DETECTED Final   Streptococcus agalactiae NOT DETECTED NOT DETECTED Final   Streptococcus pneumoniae NOT DETECTED NOT DETECTED Final   Streptococcus pyogenes NOT DETECTED  NOT DETECTED Final   A.calcoaceticus-baumannii NOT DETECTED NOT DETECTED Final   Bacteroides fragilis NOT DETECTED NOT DETECTED Final   Enterobacterales NOT DETECTED NOT DETECTED Final   Enterobacter cloacae complex NOT DETECTED NOT DETECTED Final   Escherichia coli NOT DETECTED NOT DETECTED Final   Klebsiella aerogenes NOT DETECTED NOT DETECTED Final   Klebsiella oxytoca NOT DETECTED NOT DETECTED Final   Klebsiella pneumoniae NOT DETECTED NOT DETECTED Final   Proteus species NOT DETECTED NOT DETECTED Final   Salmonella species NOT DETECTED NOT DETECTED Final   Serratia marcescens NOT DETECTED NOT DETECTED Final   Haemophilus influenzae NOT DETECTED NOT DETECTED Final   Neisseria meningitidis NOT DETECTED NOT DETECTED Final   Pseudomonas aeruginosa NOT DETECTED NOT DETECTED Final   Stenotrophomonas maltophilia NOT DETECTED NOT DETECTED Final   Candida albicans NOT DETECTED NOT DETECTED Final   Candida auris NOT DETECTED NOT DETECTED Final   Candida glabrata  NOT DETECTED NOT DETECTED Final   Candida krusei NOT DETECTED NOT DETECTED Final   Candida parapsilosis NOT DETECTED NOT DETECTED Final   Candida tropicalis NOT DETECTED NOT DETECTED Final   Cryptococcus neoformans/gattii NOT DETECTED NOT DETECTED Final    Comment: Performed at [facility], [address], [place], [place] [address]     Labs: Basic Metabolic Panel: Recent Labs  Lab 07/31/20 1535 07/31/20 2123 08/01/20 0534 08/02/20 0653  NA 137  --  136 137  K 4.0  --  3.8 3.8  CL 106  --  106 107  CO2 22  --  24 24  GLUCOSE 120*  --  101* 97  BUN 25*  --  20 17  CREATININE 1.26*  --  1.11 0.92  CALCIUM 8.5*  --  8.1* 8.4*  MG  --   --  1.8  --   PHOS  --  3.0  --   --    Liver Function Tests: Recent Labs  Lab 07/31/20 1535 08/01/20 0534  AST 16 13*  ALT 12 10  ALKPHOS 66 56  BILITOT 0.7 1.1  PROT 6.8 5.9*  ALBUMIN 3.6 3.0*   No results for input(s): LIPASE, AMYLASE in the last 168 hours. No results  for input(s): AMMONIA in the last 168 hours. CBC: Recent Labs  Lab 07/31/20 1535 08/01/20 0534 08/02/20 0653 08/03/20 0401  WBC 10.8* 7.9 8.6 7.8  NEUTROABS 8.9*  --   --   --   HGB 14.5 12.8* 13.2 12.9*  HCT 44.2 38.7* 40.1 39.6  MCV 98.4 97.5 97.3 98.0  PLT 218 197 204 223   Cardiac Enzymes: No results for input(s): CKTOTAL, CKMB, CKMBINDEX, TROPONINI in the last 168 hours. BNP: Invalid input(s): POCBNP CBG: No results for input(s): GLUCAP in the last 168 hours.  Time coordinating discharge:  36 minutes  Signed:  Catarina Hartshorn, DO [facility] Pager: [phone] 08/03/2020, 9:04 AM

## 2020-08-03 NOTE — Progress Notes (Signed)
ANTICOAGULATION CONSULT NOTE   Pharmacy Consult for Heparin Indication: pulmonary embolus  Allergies  Allergen Reactions  . Sulfa Antibiotics Nausea And Vomiting    Patient Measurements: Height: 5\' 9"  (175.3 cm) Weight: 84.9 kg (187 lb 2.7 oz) IBW/kg (Calculated) : 70.7 Heparin Dosing Weight: 79.4 kg  Vital Signs: Temp: 98 F (36.7 C) (05/08 0535) Temp Source: Oral (05/07 2100) BP: 134/80 (05/08 0535) Pulse Rate: 62 (05/08 0535)  Labs: Recent Labs    07/31/20 1535 07/31/20 1956 07/31/20 2123 08/01/20 0534 08/01/20 1304 08/01/20 2208 08/02/20 0653 08/03/20 0401  HGB 14.5  --   --  12.8*  --   --  13.2 12.9*  HCT 44.2  --   --  38.7*  --   --  40.1 39.6  PLT 218  --   --  197  --   --  204 223  APTT  --   --   --  76*  --   --   --   --   LABPROT  --   --   --  15.7*  --   --   --   --   INR  --   --   --  1.3*  --   --   --   --   HEPARINUNFRC  --   --   --  0.64   < > 0.47 0.48 0.41  CREATININE 1.26*  --   --  1.11  --   --  0.92  --   TROPONINIHS  --  54* 55*  --   --   --   --   --    < > = values in this interval not displayed.    Estimated Creatinine Clearance: 57.7 mL/min (by C-G formula based on SCr of 0.92 mg/dL).   Medical History: Past Medical History:  Diagnosis Date  . Arthritis   . Dyslipidemia   . Hypertension   . IBS (irritable bowel syndrome)    With loose stool  . Macular degeneration disease     Assessment: 87 yo male presented on 07/31/2020 with SOB. CTA chest found bilateral PE, moderate clot burden, no RHS. Pharmacy consulted for heparin for PE. No anticoagulation prior to admission. Hgb 14.5. Plt 218.  HL 0.41 on 1250 units/hr of heparin. No bleeding issues noted.   Goal of Therapy:  Heparin level 0.3-0.7 units/ml Monitor platelets by anticoagulation protocol: Yes   Plan:  Continue heparin infusion at 1250 units/hr Monitor heparin level, CBC, and s/s of bleeding daily   09/30/2020, PharmD, MBA, BCGP Clinical  Pharmacist  08/03/2020 7:48 AM

## 2020-08-05 LAB — CULTURE, BLOOD (ROUTINE X 2)
Culture: NO GROWTH
Special Requests: ADEQUATE

## 2020-08-13 DIAGNOSIS — J9601 Acute respiratory failure with hypoxia: Secondary | ICD-10-CM | POA: Diagnosis not present

## 2020-08-13 DIAGNOSIS — F0391 Unspecified dementia with behavioral disturbance: Secondary | ICD-10-CM | POA: Diagnosis not present

## 2020-08-13 DIAGNOSIS — I2699 Other pulmonary embolism without acute cor pulmonale: Secondary | ICD-10-CM | POA: Diagnosis not present

## 2020-08-21 DIAGNOSIS — K580 Irritable bowel syndrome with diarrhea: Secondary | ICD-10-CM | POA: Diagnosis not present

## 2020-08-21 DIAGNOSIS — E782 Mixed hyperlipidemia: Secondary | ICD-10-CM | POA: Diagnosis not present

## 2020-08-21 DIAGNOSIS — I2699 Other pulmonary embolism without acute cor pulmonale: Secondary | ICD-10-CM | POA: Diagnosis not present

## 2020-08-26 DIAGNOSIS — K219 Gastro-esophageal reflux disease without esophagitis: Secondary | ICD-10-CM | POA: Diagnosis not present

## 2020-08-26 DIAGNOSIS — I10 Essential (primary) hypertension: Secondary | ICD-10-CM | POA: Diagnosis not present

## 2020-09-15 DIAGNOSIS — K219 Gastro-esophageal reflux disease without esophagitis: Secondary | ICD-10-CM | POA: Diagnosis not present

## 2020-09-15 DIAGNOSIS — I10 Essential (primary) hypertension: Secondary | ICD-10-CM | POA: Diagnosis not present

## 2020-09-23 ENCOUNTER — Encounter (INDEPENDENT_AMBULATORY_CARE_PROVIDER_SITE_OTHER): Payer: Medicare HMO | Admitting: Ophthalmology

## 2020-10-14 DIAGNOSIS — F0151 Vascular dementia with behavioral disturbance: Secondary | ICD-10-CM | POA: Diagnosis not present

## 2020-10-14 DIAGNOSIS — J439 Emphysema, unspecified: Secondary | ICD-10-CM | POA: Diagnosis not present

## 2020-10-14 DIAGNOSIS — K5900 Constipation, unspecified: Secondary | ICD-10-CM | POA: Diagnosis not present

## 2020-10-14 DIAGNOSIS — Z515 Encounter for palliative care: Secondary | ICD-10-CM | POA: Diagnosis not present

## 2020-10-14 DIAGNOSIS — I700 Atherosclerosis of aorta: Secondary | ICD-10-CM | POA: Diagnosis not present

## 2020-10-14 DIAGNOSIS — R54 Age-related physical debility: Secondary | ICD-10-CM | POA: Diagnosis not present

## 2020-10-28 DIAGNOSIS — M6281 Muscle weakness (generalized): Secondary | ICD-10-CM | POA: Diagnosis not present

## 2020-10-28 DIAGNOSIS — R262 Difficulty in walking, not elsewhere classified: Secondary | ICD-10-CM | POA: Diagnosis not present

## 2020-10-31 DIAGNOSIS — R262 Difficulty in walking, not elsewhere classified: Secondary | ICD-10-CM | POA: Diagnosis not present

## 2020-10-31 DIAGNOSIS — M6281 Muscle weakness (generalized): Secondary | ICD-10-CM | POA: Diagnosis not present

## 2020-11-08 DIAGNOSIS — F0151 Vascular dementia with behavioral disturbance: Secondary | ICD-10-CM | POA: Diagnosis not present

## 2020-11-08 DIAGNOSIS — M1990 Unspecified osteoarthritis, unspecified site: Secondary | ICD-10-CM | POA: Diagnosis not present

## 2020-11-08 DIAGNOSIS — R54 Age-related physical debility: Secondary | ICD-10-CM | POA: Diagnosis not present

## 2020-12-27 DEATH — deceased

## 2022-05-01 IMAGING — CT CT ANGIO CHEST
2 of 5 series · 18 of 36 positions shown · IV contrast (Omnipaque or Isovue)
Comparison: 07/31/2020

CLINICAL DATA: Short of breath, positive D-dimer, hypoxia

EXAM:
CT ANGIOGRAPHY CHEST WITH CONTRAST
TECHNIQUE: Multidetector CT imaging of the chest was performed using the
standard protocol during bolus administration of intravenous
contrast. Multiplanar CT image reconstructions and MIPs were
obtained to evaluate the vascular anatomy.
CONTRAST:  75mL OMNIPAQUE IOHEXOL 350 MG/ML SOLN

[Series 6: pe axial thins · axial · 0.98mm/px · z∈[+1115,+1381]mm · 17 of 368 slices shown]
[im 18/368  lung]
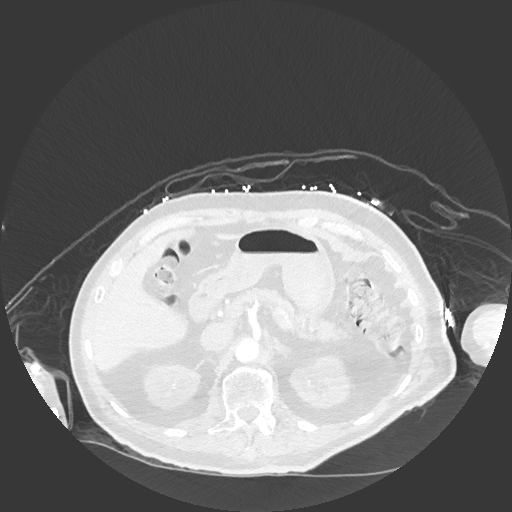
[im 35/368  mediastinal]
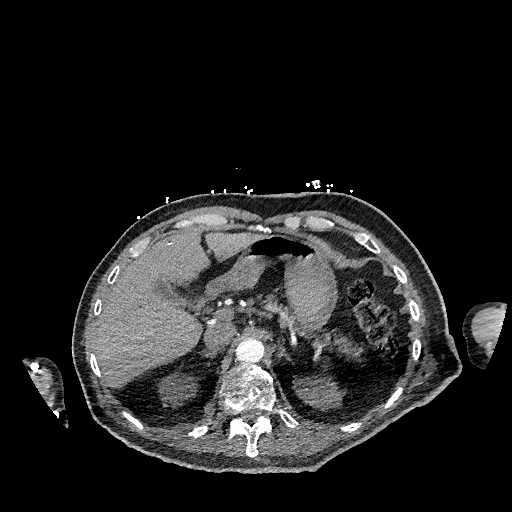
[im 53/368  lung]
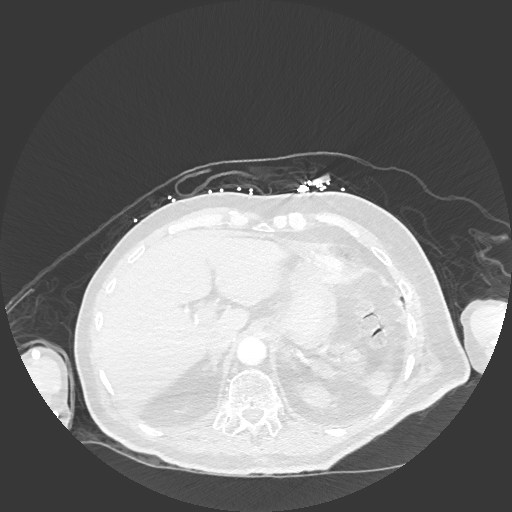
[im 88/368  mediastinal]
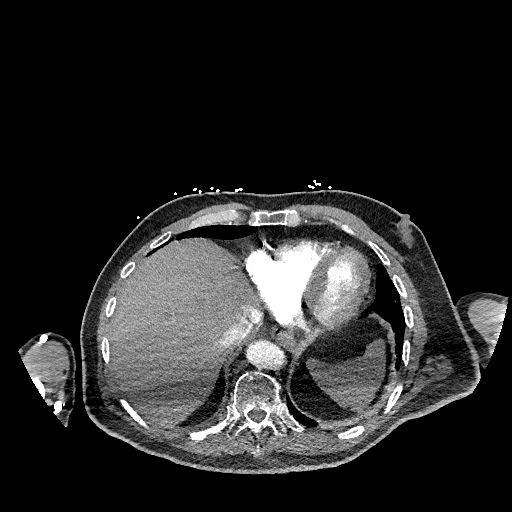
[im 105/368  lung]
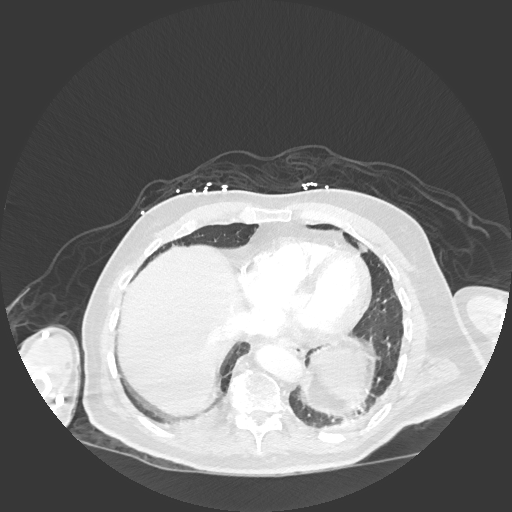
[im 123/368  mediastinal]
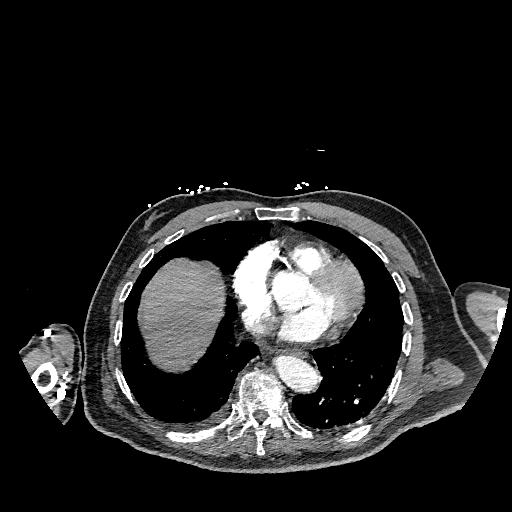
[im 140/368  lung]
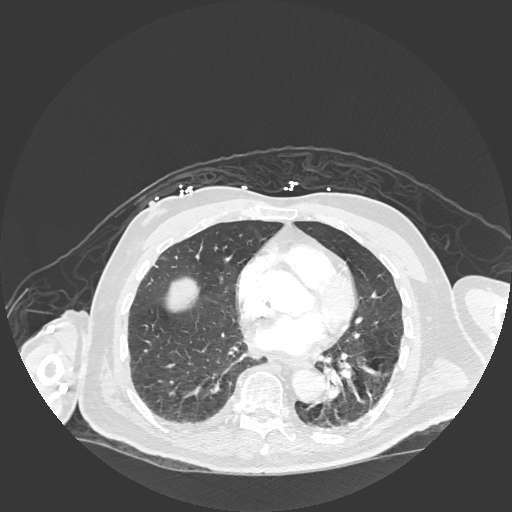
[im 158/368  mediastinal]
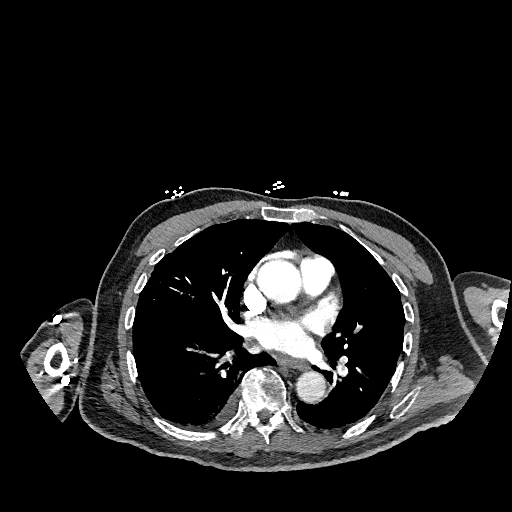
[im 193/368  lung]
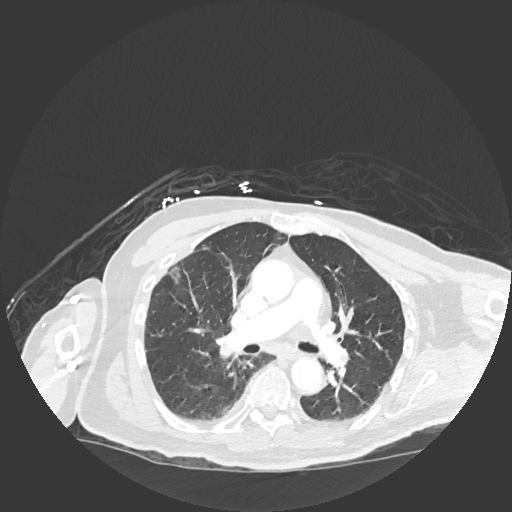
[im 210/368  mediastinal]
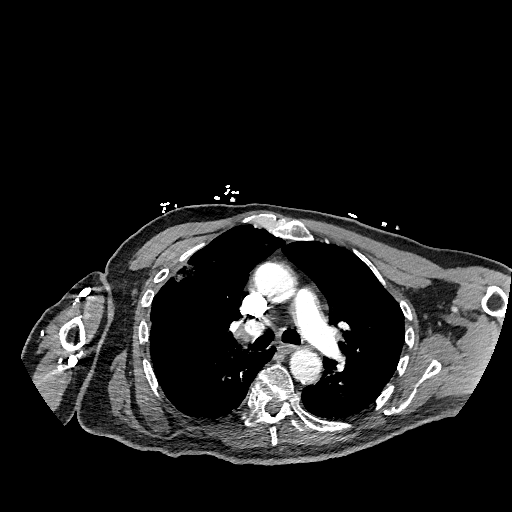
[im 228/368  lung]
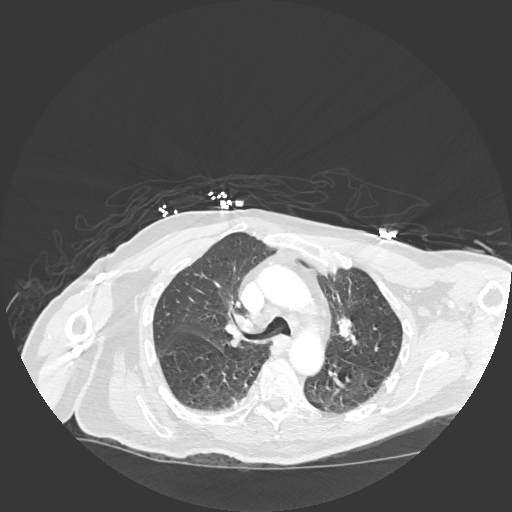
[im 245/368  mediastinal]
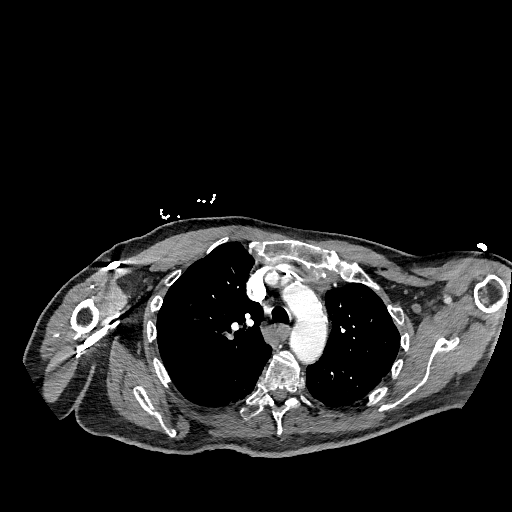
[im 263/368  lung]
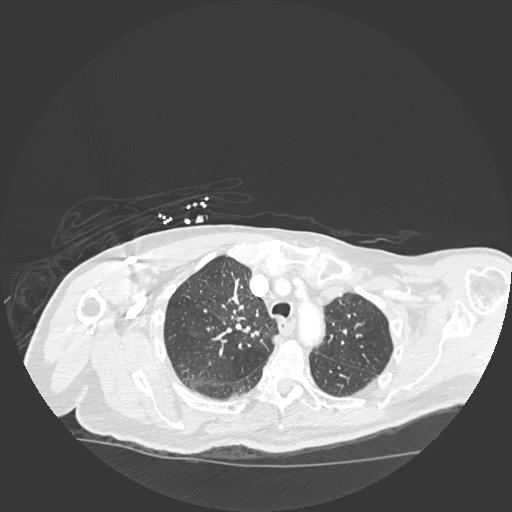
[im 280/368  mediastinal]
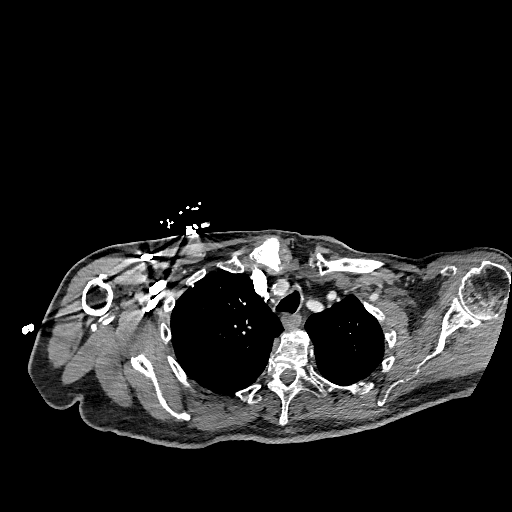
[im 315/368  lung]
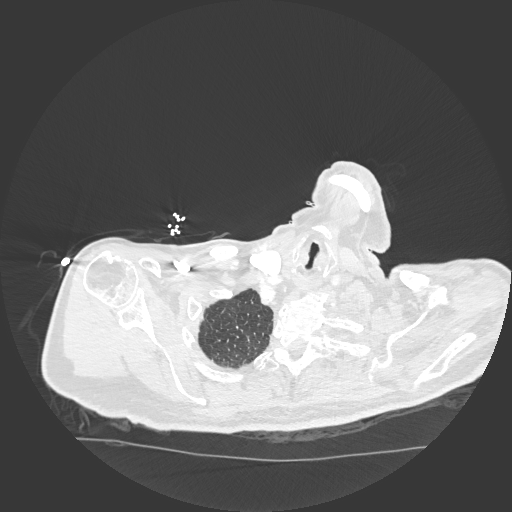
[im 333/368  mediastinal]
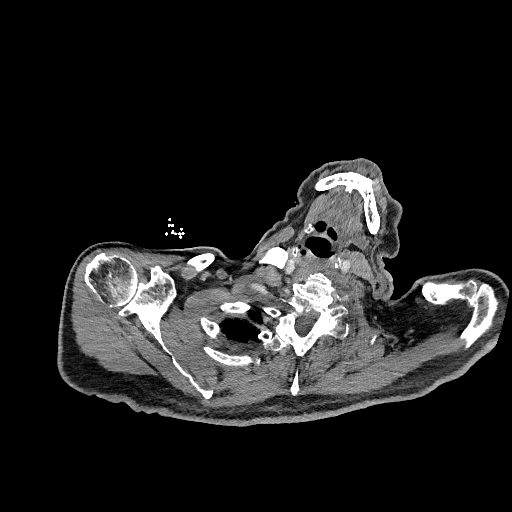
[im 350/368  lung]
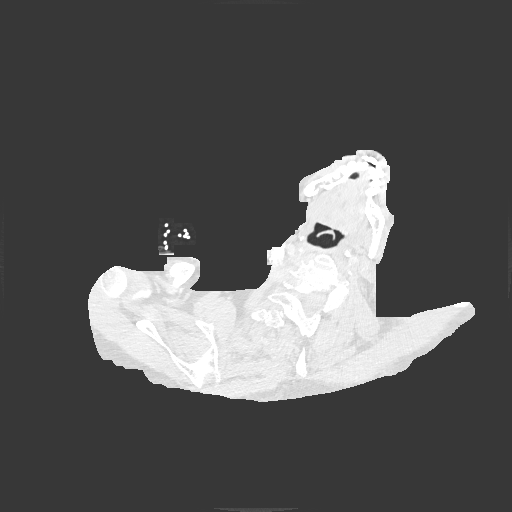

[Series 9: cor soft · coronal · 0.65mm/px · 1 of 151 slices shown]
[im 76/151  mediastinal]
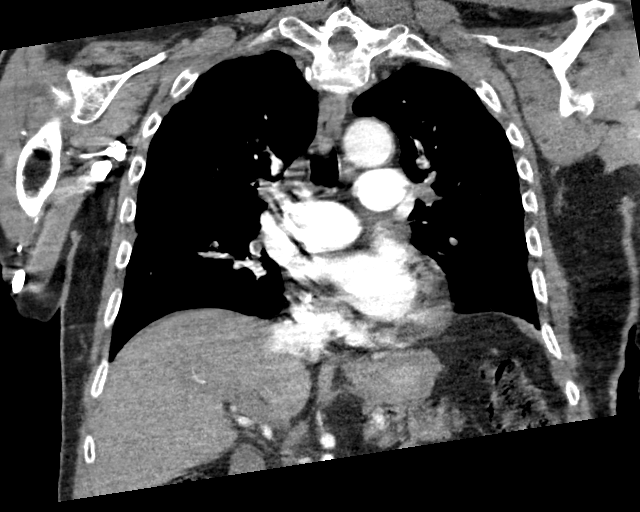

[18 of 36 positions shown; findings below may reference images not displayed]

FINDINGS: Cardiovascular: This is a technically adequate evaluation of the
pulmonary vasculature. There are bilateral pulmonary emboli seen
within the main pulmonary arteries and extending into the bilateral
segmental branches. Overall, clot burden is moderate, though there
is no evidence of right ventricular dilatation or right heart
strain.

The heart is unremarkable without pericardial effusion. Moderate
atherosclerosis throughout the coronary vasculature. No evidence of
thoracic aortic aneurysm or dissection. Mild atherosclerosis of the
aortic arch.

Mediastinum/Nodes: No enlarged mediastinal, hilar, or axillary lymph
nodes. Thyroid gland, trachea, and esophagus demonstrate no
significant findings.

Lungs/Pleura: There are trace bilateral pleural effusions. Mild
background emphysema and scarring without airspace disease or
pneumothorax. The central airways are patent.

Upper Abdomen: No acute abnormality.

Musculoskeletal: No acute or destructive bony lesions. Reconstructed
images demonstrate no additional findings.

Review of the MIP images confirms the above findings.
IMPRESSION: 1. Bilateral pulmonary emboli, with moderate clot burden. No
evidence of right heart strain.
2. Trace bilateral pleural effusions.
3. Aortic Atherosclerosis ([id number].D) and Emphysema ([id number].B).

Critical Value/emergent results were called by telephone at the time
of interpretation on 07/31/2020 at [DATE] to provider SO EGGERS
, who verbally acknowledged these results.
